# Patient Record
Sex: Female | Born: 1960 | Race: White | Hispanic: No | Marital: Single | Smoking: Current some day smoker
Health system: Southern US, Community
[De-identification: ages and names within clinical notes are randomized; demographics above are authoritative.]

## PROBLEM LIST (undated history)

## (undated) ENCOUNTER — Inpatient Hospital Stay: Admission: EM | Payer: Self-pay | Source: Home / Self Care

## (undated) DIAGNOSIS — G35 Multiple sclerosis: Secondary | ICD-10-CM

## (undated) DIAGNOSIS — F32A Depression, unspecified: Secondary | ICD-10-CM

## (undated) DIAGNOSIS — J439 Emphysema, unspecified: Secondary | ICD-10-CM

## (undated) DIAGNOSIS — R51 Headache: Secondary | ICD-10-CM

## (undated) DIAGNOSIS — T402X5A Adverse effect of other opioids, initial encounter: Secondary | ICD-10-CM

## (undated) DIAGNOSIS — F329 Major depressive disorder, single episode, unspecified: Secondary | ICD-10-CM

## (undated) DIAGNOSIS — R519 Headache, unspecified: Secondary | ICD-10-CM

## (undated) DIAGNOSIS — D126 Benign neoplasm of colon, unspecified: Secondary | ICD-10-CM

## (undated) DIAGNOSIS — R251 Tremor, unspecified: Secondary | ICD-10-CM

## (undated) DIAGNOSIS — R569 Unspecified convulsions: Secondary | ICD-10-CM

## (undated) DIAGNOSIS — E0590 Thyrotoxicosis, unspecified without thyrotoxic crisis or storm: Secondary | ICD-10-CM

## (undated) DIAGNOSIS — D352 Benign neoplasm of pituitary gland: Secondary | ICD-10-CM

## (undated) DIAGNOSIS — K219 Gastro-esophageal reflux disease without esophagitis: Secondary | ICD-10-CM

## (undated) DIAGNOSIS — M1990 Unspecified osteoarthritis, unspecified site: Secondary | ICD-10-CM

## (undated) DIAGNOSIS — K529 Noninfective gastroenteritis and colitis, unspecified: Secondary | ICD-10-CM

## (undated) DIAGNOSIS — K5790 Diverticulosis of intestine, part unspecified, without perforation or abscess without bleeding: Secondary | ICD-10-CM

## (undated) DIAGNOSIS — F419 Anxiety disorder, unspecified: Secondary | ICD-10-CM

## (undated) DIAGNOSIS — G8929 Other chronic pain: Secondary | ICD-10-CM

## (undated) DIAGNOSIS — F5000 Anorexia nervosa, unspecified: Secondary | ICD-10-CM

## (undated) DIAGNOSIS — G479 Sleep disorder, unspecified: Secondary | ICD-10-CM

## (undated) DIAGNOSIS — Z87448 Personal history of other diseases of urinary system: Secondary | ICD-10-CM

## (undated) DIAGNOSIS — Z9889 Other specified postprocedural states: Secondary | ICD-10-CM

## (undated) DIAGNOSIS — M797 Fibromyalgia: Secondary | ICD-10-CM

## (undated) DIAGNOSIS — R112 Nausea with vomiting, unspecified: Secondary | ICD-10-CM

## (undated) DIAGNOSIS — K5903 Drug induced constipation: Secondary | ICD-10-CM

## (undated) DIAGNOSIS — H269 Unspecified cataract: Secondary | ICD-10-CM

## (undated) DIAGNOSIS — M810 Age-related osteoporosis without current pathological fracture: Secondary | ICD-10-CM

## (undated) HISTORY — DX: Depression, unspecified: F32.A

## (undated) HISTORY — DX: Unspecified cataract: H26.9

## (undated) HISTORY — DX: Unspecified convulsions: R56.9

## (undated) HISTORY — DX: Noninfective gastroenteritis and colitis, unspecified: K52.9

## (undated) HISTORY — DX: Unspecified osteoarthritis, unspecified site: M19.90

## (undated) HISTORY — PX: OTHER SURGICAL HISTORY: SHX169

## (undated) HISTORY — DX: Major depressive disorder, single episode, unspecified: F32.9

## (undated) HISTORY — DX: Age-related osteoporosis without current pathological fracture: M81.0

## (undated) HISTORY — DX: Tremor, unspecified: R25.1

## (undated) HISTORY — PX: CHOLECYSTECTOMY: SHX55

## (undated) HISTORY — PX: OOPHORECTOMY: SHX86

## (undated) HISTORY — DX: Benign neoplasm of pituitary gland: D35.2

## (undated) HISTORY — DX: Anxiety disorder, unspecified: F41.9

## (undated) HISTORY — PX: TONSILLECTOMY: SUR1361

## (undated) HISTORY — DX: Headache: R51

## (undated) HISTORY — DX: Other chronic pain: G89.29

## (undated) HISTORY — PX: DENTAL SURGERY: SHX609

## (undated) HISTORY — PX: APPENDECTOMY: SHX54

## (undated) HISTORY — DX: Fibromyalgia: M79.7

## (undated) HISTORY — PX: NECK SURGERY: SHX720

## (undated) HISTORY — DX: Benign neoplasm of colon, unspecified: D12.6

## (undated) HISTORY — DX: Sleep disorder, unspecified: G47.9

## (undated) HISTORY — DX: Adverse effect of other opioids, initial encounter: K59.03

## (undated) HISTORY — PX: COLONOSCOPY: SHX174

## (undated) HISTORY — DX: Adverse effect of other opioids, initial encounter: T40.2X5A

## (undated) HISTORY — DX: Headache, unspecified: R51.9

## (undated) HISTORY — PX: LAPAROSCOPY: SHX197

## (undated) HISTORY — DX: Anorexia nervosa, unspecified: F50.00

## (undated) HISTORY — DX: Thyrotoxicosis, unspecified without thyrotoxic crisis or storm: E05.90

## (undated) HISTORY — DX: Diverticulosis of intestine, part unspecified, without perforation or abscess without bleeding: K57.90

## (undated) HISTORY — DX: Emphysema, unspecified: J43.9

---

## 1998-10-16 ENCOUNTER — Other Ambulatory Visit: Admission: RE | Admit: 1998-10-16 | Discharge: 1998-10-16 | Payer: Self-pay | Admitting: Obstetrics and Gynecology

## 2000-03-31 ENCOUNTER — Other Ambulatory Visit: Admission: RE | Admit: 2000-03-31 | Discharge: 2000-03-31 | Payer: Self-pay | Admitting: Obstetrics and Gynecology

## 2002-11-10 ENCOUNTER — Other Ambulatory Visit: Admission: RE | Admit: 2002-11-10 | Discharge: 2002-11-10 | Payer: Self-pay | Admitting: Ophthalmology

## 2004-10-15 ENCOUNTER — Encounter
Admission: RE | Admit: 2004-10-15 | Discharge: 2005-01-13 | Payer: Self-pay | Admitting: Physical Medicine and Rehabilitation

## 2004-10-17 ENCOUNTER — Ambulatory Visit: Payer: Self-pay | Admitting: Physical Medicine and Rehabilitation

## 2004-10-29 ENCOUNTER — Ambulatory Visit (HOSPITAL_COMMUNITY)
Admission: RE | Admit: 2004-10-29 | Discharge: 2004-10-29 | Payer: Self-pay | Admitting: Physical Medicine and Rehabilitation

## 2004-12-20 ENCOUNTER — Ambulatory Visit: Payer: Self-pay | Admitting: Physical Medicine and Rehabilitation

## 2005-01-21 ENCOUNTER — Encounter
Admission: RE | Admit: 2005-01-21 | Discharge: 2005-04-21 | Payer: Self-pay | Admitting: Physical Medicine and Rehabilitation

## 2005-01-21 ENCOUNTER — Ambulatory Visit: Payer: Self-pay | Admitting: Physical Medicine and Rehabilitation

## 2005-03-05 ENCOUNTER — Ambulatory Visit: Payer: Self-pay | Admitting: Physical Medicine and Rehabilitation

## 2005-05-01 ENCOUNTER — Ambulatory Visit: Payer: Self-pay | Admitting: Physical Medicine and Rehabilitation

## 2005-05-01 ENCOUNTER — Other Ambulatory Visit: Admission: RE | Admit: 2005-05-01 | Discharge: 2005-05-01 | Payer: Self-pay | Admitting: Obstetrics and Gynecology

## 2005-05-01 ENCOUNTER — Encounter
Admission: RE | Admit: 2005-05-01 | Discharge: 2005-07-30 | Payer: Self-pay | Admitting: Physical Medicine and Rehabilitation

## 2005-06-28 ENCOUNTER — Ambulatory Visit: Payer: Self-pay | Admitting: Physical Medicine and Rehabilitation

## 2005-07-30 ENCOUNTER — Encounter
Admission: RE | Admit: 2005-07-30 | Discharge: 2005-10-28 | Payer: Self-pay | Admitting: Physical Medicine and Rehabilitation

## 2005-07-30 ENCOUNTER — Ambulatory Visit: Payer: Self-pay | Admitting: Physical Medicine and Rehabilitation

## 2005-08-29 ENCOUNTER — Ambulatory Visit: Payer: Self-pay | Admitting: Physical Medicine and Rehabilitation

## 2005-10-25 ENCOUNTER — Encounter
Admission: RE | Admit: 2005-10-25 | Discharge: 2006-01-23 | Payer: Self-pay | Admitting: Physical Medicine and Rehabilitation

## 2005-10-25 ENCOUNTER — Ambulatory Visit: Payer: Self-pay | Admitting: Physical Medicine and Rehabilitation

## 2005-12-03 ENCOUNTER — Ambulatory Visit: Payer: Self-pay | Admitting: Physical Medicine and Rehabilitation

## 2005-12-30 ENCOUNTER — Ambulatory Visit: Payer: Self-pay | Admitting: Physical Medicine and Rehabilitation

## 2006-01-24 ENCOUNTER — Encounter
Admission: RE | Admit: 2006-01-24 | Discharge: 2006-04-24 | Payer: Self-pay | Admitting: Physical Medicine and Rehabilitation

## 2006-02-21 ENCOUNTER — Ambulatory Visit: Payer: Self-pay | Admitting: Physical Medicine and Rehabilitation

## 2006-03-27 ENCOUNTER — Ambulatory Visit: Payer: Self-pay | Admitting: Physical Medicine and Rehabilitation

## 2006-04-25 ENCOUNTER — Encounter
Admission: RE | Admit: 2006-04-25 | Discharge: 2006-07-24 | Payer: Self-pay | Admitting: Physical Medicine and Rehabilitation

## 2006-04-30 ENCOUNTER — Ambulatory Visit: Payer: Self-pay | Admitting: Physical Medicine and Rehabilitation

## 2006-07-04 ENCOUNTER — Ambulatory Visit: Payer: Self-pay | Admitting: Physical Medicine and Rehabilitation

## 2006-07-08 HISTORY — PX: ABDOMINAL HYSTERECTOMY: SHX81

## 2006-07-30 ENCOUNTER — Encounter
Admission: RE | Admit: 2006-07-30 | Discharge: 2006-10-28 | Payer: Self-pay | Admitting: Physical Medicine and Rehabilitation

## 2006-08-26 ENCOUNTER — Ambulatory Visit: Payer: Self-pay | Admitting: Physical Medicine and Rehabilitation

## 2006-10-30 ENCOUNTER — Encounter
Admission: RE | Admit: 2006-10-30 | Discharge: 2007-01-28 | Payer: Self-pay | Admitting: Physical Medicine and Rehabilitation

## 2006-11-27 ENCOUNTER — Ambulatory Visit: Payer: Self-pay | Admitting: Physical Medicine and Rehabilitation

## 2006-12-26 ENCOUNTER — Encounter
Admission: RE | Admit: 2006-12-26 | Discharge: 2007-03-26 | Payer: Self-pay | Admitting: Physical Medicine and Rehabilitation

## 2007-01-12 ENCOUNTER — Ambulatory Visit: Payer: Self-pay | Admitting: Physical Medicine and Rehabilitation

## 2007-02-10 ENCOUNTER — Encounter
Admission: RE | Admit: 2007-02-10 | Discharge: 2007-05-11 | Payer: Self-pay | Admitting: Physical Medicine and Rehabilitation

## 2007-03-13 ENCOUNTER — Ambulatory Visit: Payer: Self-pay | Admitting: Physical Medicine and Rehabilitation

## 2007-04-14 ENCOUNTER — Ambulatory Visit: Payer: Self-pay | Admitting: Physical Medicine and Rehabilitation

## 2007-04-15 ENCOUNTER — Ambulatory Visit (HOSPITAL_COMMUNITY)
Admission: RE | Admit: 2007-04-15 | Discharge: 2007-04-15 | Payer: Self-pay | Admitting: Physical Medicine and Rehabilitation

## 2007-05-12 ENCOUNTER — Encounter
Admission: RE | Admit: 2007-05-12 | Discharge: 2007-06-19 | Payer: Self-pay | Admitting: Physical Medicine and Rehabilitation

## 2007-05-12 ENCOUNTER — Ambulatory Visit: Payer: Self-pay | Admitting: Physical Medicine and Rehabilitation

## 2007-07-14 ENCOUNTER — Ambulatory Visit: Payer: Self-pay | Admitting: Physical Medicine and Rehabilitation

## 2007-07-14 ENCOUNTER — Encounter
Admission: RE | Admit: 2007-07-14 | Discharge: 2007-10-12 | Payer: Self-pay | Admitting: Physical Medicine and Rehabilitation

## 2007-11-11 ENCOUNTER — Encounter
Admission: RE | Admit: 2007-11-11 | Discharge: 2007-11-11 | Payer: Self-pay | Admitting: Physical Medicine and Rehabilitation

## 2008-05-19 ENCOUNTER — Encounter
Admission: RE | Admit: 2008-05-19 | Discharge: 2008-08-17 | Payer: Self-pay | Admitting: Physical Medicine and Rehabilitation

## 2008-05-20 ENCOUNTER — Ambulatory Visit: Payer: Self-pay | Admitting: Physical Medicine and Rehabilitation

## 2008-06-17 ENCOUNTER — Ambulatory Visit: Payer: Self-pay | Admitting: Physical Medicine and Rehabilitation

## 2008-07-15 ENCOUNTER — Ambulatory Visit: Payer: Self-pay | Admitting: Physical Medicine and Rehabilitation

## 2008-09-05 ENCOUNTER — Ambulatory Visit: Payer: Self-pay | Admitting: Physical Medicine and Rehabilitation

## 2008-09-05 ENCOUNTER — Encounter
Admission: RE | Admit: 2008-09-05 | Discharge: 2008-12-04 | Payer: Self-pay | Admitting: Physical Medicine and Rehabilitation

## 2008-11-02 ENCOUNTER — Ambulatory Visit: Payer: Self-pay | Admitting: Physical Medicine and Rehabilitation

## 2008-12-26 ENCOUNTER — Encounter
Admission: RE | Admit: 2008-12-26 | Discharge: 2009-03-26 | Payer: Self-pay | Admitting: Physical Medicine and Rehabilitation

## 2008-12-28 ENCOUNTER — Ambulatory Visit: Payer: Self-pay | Admitting: Physical Medicine and Rehabilitation

## 2009-02-24 ENCOUNTER — Ambulatory Visit: Payer: Self-pay | Admitting: Physical Medicine and Rehabilitation

## 2009-03-06 ENCOUNTER — Ambulatory Visit (HOSPITAL_COMMUNITY)
Admission: RE | Admit: 2009-03-06 | Discharge: 2009-03-06 | Payer: Self-pay | Admitting: Physical Medicine and Rehabilitation

## 2009-05-25 ENCOUNTER — Encounter
Admission: RE | Admit: 2009-05-25 | Discharge: 2009-06-29 | Payer: Self-pay | Admitting: Physical Medicine and Rehabilitation

## 2009-05-26 ENCOUNTER — Ambulatory Visit: Payer: Self-pay | Admitting: Physical Medicine and Rehabilitation

## 2009-07-17 ENCOUNTER — Encounter
Admission: RE | Admit: 2009-07-17 | Discharge: 2009-10-06 | Payer: Self-pay | Admitting: Physical Medicine and Rehabilitation

## 2009-07-19 ENCOUNTER — Ambulatory Visit: Payer: Self-pay | Admitting: Physical Medicine and Rehabilitation

## 2009-10-06 ENCOUNTER — Ambulatory Visit: Payer: Self-pay | Admitting: Physical Medicine and Rehabilitation

## 2009-11-30 ENCOUNTER — Encounter: Admission: RE | Admit: 2009-11-30 | Discharge: 2009-11-30 | Payer: Self-pay | Admitting: Psychiatry

## 2010-07-29 ENCOUNTER — Encounter: Payer: Self-pay | Admitting: Psychiatry

## 2010-11-20 NOTE — Assessment & Plan Note (Signed)
[name] is a married 50 year old woman who has two children at home.  She was last seen by me on June 17, 2008.   She is a patient of Dr. Keturah Barre and Dr. Delene Loll and Dr.  Trudie Buckler.   She is being followed in our Pain and Rehabilitative clinic for chronic  pain which is predominately related to her multiple sclerosis.  She is  currently on hydrocodone.  She is back in today for an another  prescription.   She states her average pain is about 7 on a scale of 10.  She reports  good relief with the use of the hydrocodone.  She is requesting a slight  escalation in her dosage from 3 times day to an occasional fourth dose  per day.   Her sleep is fair to afford.  Pain is typically worse with activities,  improves with rest, therapy, pacing her activities, and medication.   FUNCTIONAL STATUS:  The patient is able to walk a variable amount of  time depending on the day with her multiple sclerosis.  She is able to  climb stairs, but needs assistance.  She does drive.  She is independent  with self care and needs assistance with heavier household tasks.  She  has occasional difficulty with her bladder, currently no problems at  this time.  She does report history of depression and anxiety, but  denies harm to self or others.   REVIEW OF SYSTEMS:  Otherwise negative.  She has multiple complaints  related to the multiple sclerosis including patchy numbness in the upper  and lower extremities as well as periorally, visual problems  intermittently, and weakness and tremors and spasms intermittently.   No other changes in past medical, social, and family history since I saw  her our last she has been relatively healthy.   Medications which are provided through this clinic include hydrocodone  and acetaminophen 7.5/325 three times a day, 90 per month.   Other medications which she takes which are provided through other  physicians include the following from Dr.  Tinnie Gens, she uses Copaxone,  baclofen 10 mg she takes is typically one at night, and methylphenidate  per Dr. Tinnie Gens, 10 mg once a day about 5 times per week, gabapentin 300  mg once at night typically per Dr. Tinnie Gens as well.  Through Dr.  Claudie Revering, she uses Xanax 0.5 up to 4 times a day and Prozac 40 mg 2  tablets once a day.  She has recently been put within the last week  Remeron 15 mg at night.  She is also on omeprazole per Dr. Tinnie Gens and  Zofran.   Exam today, blood pressure is 106/73, pulse 76, respirations 18, and 97%  saturated on room air.  She is a thin, adult female who appears her  stated age, does not appear in any distress.   She is oriented x3.  Speech is slightly dysarthric and her overall  affect is bright, alert.  She is cooperative and pleasant.  Answers  questions appropriately and follows commands without difficulty.   She has diminished coordination in bilateral upper and lower  extremities.  Reflexes are slightly brisk throughout.  She has sensation  to light touch in her upper as well as lower extremities.  She also  states periorally she has some numbness today and along her left cheek.   Her motor strength is in the 4-/5 range at hip flexors, hip abductors  and knee extensors, dorsiflexors are  4/5.  She has very little in the  way of evertors bilaterally.  Plantar flexors are 4+/5.  Again,  dysarthria was noted when she speaks.   She is able to transition from sitting to standing.  She has some tremor  today when she stands up.  Her gait is stable, however, she does have  difficulty with tandem gait and Romberg test.   IMPRESSION:  1. 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  2. Mild cervical spondylosis.  3. 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   PLAN:  We will  refill her hydrocodone 7.5/325 three to four times a day,  105 tablets for the month with one refill.   She has been taking her medications as prescribed and does not exhibit  any aberrant behavior with their use.  Pill counts are appropriate.  We  will see her back in 2 months.           ______________________________  Brantley Stage, M.D.     DMK/MedQ  D:  09/05/2008 12:14:50  T:  09/06/2008 02:39:11  Job #:  [id number]   cc:   Keturah Barre, MD   Delene Loll, MD   Trudie Buckler, MD

## 2010-11-20 NOTE — Assessment & Plan Note (Signed)
[name] is 50 year old married female who is being seen in our Pain  and Rehabilitative Clinic for pain complaints and impairments related to  Multiple Sclerosis. She has cervicalgia, lumbago, bilateral upper  extremity pain.   She reports over the last month she has been able to decrease her  narcotic intake. She had been taking four 10/325 Norco tablets per day.  She is down to 3. Over the last month she states that she does feel not  quite as drowsy when she takes 1 pill less during the day.   Her average pain is about a 7 on a scale of 10 which is basically  unchanged from last month. She is getting good relief with the current  meds which is an improvement also from last month.   Pain is described as an intermittent depending on activity levels,  describes it as sharp burning stabbing, tingling, aching, variably. The  amount she can walk is variable depending on the day. She is able to  climb stairs. She is able to drive. She has noted when she is out in the  community when she gets fatigued, her left foot does tend to drop and  she trips on it. She has not had any falls in the last month however.   REVIEW OF SYSTEMS:  Reviewed and attached to the chart, basically  noncontributory at today's visit.   Past medical, social, family history otherwise unchanged. She states she  is considering enrolling in a Multiple Sclerosis medication trial per  Dr. Tinnie Gens.   Medications provided by this clinic include: Norco 10/325 one p.o.  q.i.d. p.r.n. pain.   PHYSICAL EXAMINATION:  Today blood pressure is 106/69, pulse 95,  respirations 18, and 95% saturated on room air. She is a well-developed  thin adult female who does not appear in any distress.   She is oriented x3. Speech is clear. Affect is bright, alert,  cooperative, and pleasant. Follows commands without difficultly.   Transiting from sit-to-stand is done without difficultly. She did have a  little balance issue first upon  standing however gait in the room is  steady and she is able to maintain good balance. Tandem gait, Romberg's  tests are compromised however.   Reflexes are slightly increased in the lower extremities. No clonus is  noted.  Motor strength is 5/5 at hip flexors and extensors, dorsiflexors  on the right.  Dorsiflexors on  the left are compromised, she has very  little in the way of evertors on the left.   IMPRESSION:  1. Multiple Sclerosis.  2. Balance disorder secondary to #1.  3. Neuropathic lower extremity pain.  4. Bilateral lower extremity weakness especially around ankles. I am      recommending a left AFO for her for longer distances. She is not      interesting in persuing that at this time.  5. Multiple pain complaints including bilateral upper extremity pain,      intrascapular pain, lower extremity pain.  6. History of depression, anxiety.   PLAN:  [name] is overall doing well. She has been able to decrease  her narcotic usage from 4 times a day down to 3 times a day. Over the  last month overall function and pain scores have actually improved with  decreasing medication slightly. She does feel clearer.   I have recommended she consider left AFO for longer distances. She is  also going to consider discussing further evaluation with Dr. Tinnie Gens.  She will be  following up with him over the next month or 2. We will see  her back in a month. The following medications were written for her  today:  Norco 10/325 1 p.o. t.i.d. #90 no refills.           ______________________________  Brantley Stage, M.D.     DMK/MedQ  D:  03/16/2007 12:01:30  T:  03/16/2007 15:01:08  Job #:  [id number]

## 2010-11-20 NOTE — Assessment & Plan Note (Signed)
HISTORY OF PRESENT ILLNESS:  [name] is a 50 year old married female  who has a history of multiple sclerosis and who has been off and on  steroids for the last 10 years.   She is back in our clinic today for a refill of her hydrocodone. She  states over the last 10 days, she has had 2 falls. The last one was last  night.   She believes that her left side has been getting a bit weaker. She also  has noticed some increased dysarthria. She apparently tripped over her  toe in the case of both falls.   She is complaining today of some neck pain and left shoulder pain, as  well as some low back pain.   Her average pain is about an 8 on a scale of 10. The pain is worse in  the morning. Sleep is fair. She has been getting good relief with the  current medications that she has been prescribed.   The pain is described as sharp, burning, intermittent, sometimes more  constant, tingling, and aching in nature.   She has been having increasing difficulty with walking over the last  several weeks, tripping more on her left foot. At the last visit, I have  recommended she obtain and AFO for her left ankle. She has been  reluctant to pursue this, however.   She admits to some depression and anxiety. Denies suicidal ideations.   No other changes in past medical, social, or family history since last  visit.   MEDICATIONS:  Prescribed by this clinic include Norco 10/325 up to three  times a day on a p.r.n. basis. She had brought in 8 extra pills from  last month. Did not use all of her medication.   PHYSICAL EXAMINATION:  VITAL SIGNS:  Blood pressure is 104/55, pulse 76,  respiratory rate 18, 100% saturated on room air.  GENERAL:  A thin, adult female who does not appear in any distress.  NEUROLOGIC:  She is oriented x3. Speech is slightly dysarthric than I've  seen her on other visits. Her affect is overall bright. She is alert and  she is cooperative and follows commands without  difficulty. She  transitions from sitting to standing easily. As she was standing, she  did start to fall towards the left and I did need to catch her while she  was in clinic. She states that she typically just hangs on to walls when  she is walking at home and she runs into trouble when there is nothing  to hand onto. She has limited motion, especially with rotation to the  right, with respect to her cervical rotation. She has about 20 degrees  of rotation to the left and about 60 degrees of rotation to the right.  Flexion extension are mildly limited and she reports some discomfort in  her posterior mid cervical spine with these motions. She has limitations  in left shoulder abduction today and increased pain in the left  shoulder. She has tenderness along the distal acromial and in the  proximal humerus with palpation, although she has some milder diffuse  tenderness throughout the entire shoulder. She also had some tenderness  over the L5-S1 lumbar spine. Reflexes tend to be brisk throughout,  decreased at both ankles. She has a bit of increased tone in both upper  extremities today. Her motor strength is 4+/5 at the shoulder abductors,  biceps, triceps, brachial radialis and finger flexors. She has 5/5 at  the  hip flexors, knee extensors, and plantar flexors. She has very  little dorsiflexor on the left or everter on the left. Right is slightly  weak as well on examination today. She has patchy numbness in upper as  well as lower extremities, however, this is not new for her.   IMPRESSION:  This is a 50 year old with a 10 year history of being on  steroids off and on over the last 10 years with multiple sclerosis, who  appears to have a history of increasing weakness over the last several  weeks with a history of 2 falls within the last 7 to 10 days. She is  complaining of some neck discomfort and has decreased range of motion. I  would like to obtain cervical spine series to rule  out any fracture, as  well as in her left shoulder will get an AP and lateral of her left  shoulder, as well as her lumbar spine.   Would also strongly consider imaging of brain and cervical spine. She  may well have a new lesion with respect to her MS. I did place a call  over to her neurologist, Dr. Reva Bores office. I spoke with nursing  staff over there and they will see her tomorrow morning at 9:00 a.m. Dr.  Tinnie Gens is in the office and she has an appointment with nurse  practitioner, Leane Para.   She is being sent currently to [facility]  Department for x-rays. I have called over there and given them my cell  phone number and to let me know regarding any evidence of trauma. In  light of that, would order cervical MRI.   Otherwise, I will see her back in a month. I have written an order for  her to obtain an AFO on the left and I have arranged for followup with  her neurologist tomorrow morning at 9:00 a.m.           ______________________________  Brantley Stage, M.D.     DMK/MedQ  D:  04/15/2007 13:05:43  T:  04/15/2007 21:35:24  Job #:  [id number]

## 2010-11-20 NOTE — Assessment & Plan Note (Signed)
REASON FOR VISIT:  [name] is a pleasant, 50 year old, married  female who is a patient of Dr. Tinnie Gens, who is her neurologist.  She was  last seen by me on Nov 26, 2006.  She is back in today requesting a  refill of her Norco.   She states her average pain is about a 6-7/10 localized to the cervical  region, lumbar area, bilateral lower extremities and bilateral upper  extremities.  She gets fair relief with the current medications that she  is taking.  She is also followed by psychiatrist as well as Dr. Tinnie Gens.  Apparently, she has had some recent medicine changes and had been put on  Seroquel one dose in the morning and at night.  She states she had been  on Trazodone at one point which seems to have helped her sleep.  She  would like to maybe go back to that at some point and will discuss this  further with her psychiatrist next week.   Her ability to ambulate is variable and depends on the day her fatigue  waxes and wanes on a daily basis.  She has seen physical therapist for  equipment evaluation.  She did not do well with crutches and  recommendations toward a platform walker were made.  She does have  access to this at her home.  She is still interested in pursuing scooter  for community distances.  Her fatigue and pain greatly limit her ability  to participate in community activities at this point and she feels that  some of her depression may be coming from being shut in at home so much.   There are no new changes regarding her review of systems.  She denies  suicidal ideation.  She does admit to depression and anxiety, however,  and intermittent bowel and bladder irregularities.   Past medical history, social history, family history are otherwise  unchanged.  She has been healthy for the most part.   MEDICATIONS:  Norco 10/325 one p.o. four times a day p.r.n. pain.   PHYSICAL EXAMINATION:  VITAL SIGNS:  Blood pressure 106/69, pulse 95,  respirations 18, 95%  saturations on room air.  GENERAL:  She is a thin, adult female who does not appear in any  distress.  She is oriented x3.  Her speech is clear.  Her affect is  bright and alert.  She is cooperative and pleasant.  Follows commands  without difficulty.  She transitions from sitting to standing with ease  today.  Her gait in the room is asymmetric.  She has some weakness in  the dorsiflexors on the left during gait cycles.  She does not trip on  her toe, however.  NEUROMUSCULAR:  Strength in the right lower extremity in the 5/5 range,  left lower extremity is 4+ at the hip flexors, 5/5 with knee extensors,  4-/5 on the left dorsiflexors.  Balance is poor.  She has some increased  tone when she extends her lower extremities.   IMPRESSION:  1. Multiple sclerosis.  2. Balance disorder secondary to #1.  3. Neuropathic lower extremity and upper extremity pain.  4. Bilateral lower extremity weakness especially ankle dorsiflexors      and everters on the left.  Multiple pain complaints.  5. History of depression and anxiety.   PLAN:  Will refill her Norco today.  She is considering a pool program  based in her community.  She seems to be upbeat about this.  She is  frustrated.  She continues to verbalize frustration regarding her  ability to negotiate community distances.  Would strongly consider a  motorized wheelchair or scooter for her.  She would like to further  discuss this with Dr. Tinnie Gens as well.  Will see her back in 2 months  with nursing visit next month for refill of her medications.           ______________________________  Brantley Stage, M.D.     DMK/MedQ  D:  01/14/2007 13:34:52  T:  01/15/2007 07:50:44  Job #:  [id number]

## 2010-11-20 NOTE — Assessment & Plan Note (Signed)
[name] is a pleasant 50 year old married woman who is currently  living with her parents due to declining functional status.  She is a  patient of doctors, Keturah Barre, Delene Loll, and Harriette Bouillon.   She was last seen in our clinic on November 02, 2008.  She is followed in  our Pain and Rehabilitative Clinic for multiple chronic pain complaints  including left shoulder pain, low back pain, and bilateral lower  extremity dysesthetic pain.  Her pain is felt to be predominately  neuropathic in nature related to her multiple sclerosis.   Dr. Tinnie Gens continues to manage her multiple sclerosis symptoms as well.   She is back in today requesting refill of her medication.  She states in  the interim she has had some bout of low back pain which is treated with  steroids by Dr. Keturah Barre.  She has also had some increasing  discomfort in her ankles which is worse when she is up walking.  She had  some blood tests run by Dr. Sherral Hammers for rheumatoid arthritis and SLE.   With respect to her pain, she states it is about 7 on a scale of 10,  located in low back, bilateral lower extremities, worse with activities,  however, does come on while she is inactive as well.  Pain is better  with rest and pacing her activities and medication.  She reports fair-to-  good relief with current meds.   Medications that which are prescribed through this clinic include Norco  7.5/325 three to four times a day, not more than 105 tablets per month.   She also takes Xanax 1-3 tablets per day 0.5 mg strength.  She uses  Neurontin typically not more than 2 tablets per day of the 300 mg  strength.  She takes baclofen as well 10 mg, in the last month she has  taken possibly 5 pills, she does not take this very frequently.  She  uses Remeron for sleep at night, taking 2 tablets at night.  The only  medication prescribed through this clinic is hydrocodone which she takes  typically 2-3 times per  day.   FUNCTIONAL STATUS:  The patient is able to ambulate independently.  She  does drive.  She is independent with self-care, needs assistance with  higher household tasks and meal prep.   She has intermittent problems with bladder, bowel, numbness, tingling,  tremors, trouble walking, spasms, depression, anxiety.  Denies suicidal  ideation, however.   Past medical, social and family history otherwise noncontributory other  than noted in history of present illness.   PHYSICAL EXAMINATION:  Today, blood pressure is 102/66, pulse 96,  respiration 18, 99% saturated on room air.  She is a well-developed,  well-nourished female who does not appear in any distress.  She is  oriented x3.  Her speech is slightly dysarthric.  She is oriented and  follows commands without difficulty, answers my questions appropriately.   Coordination is diminished.  Reflexes are brisk in the lower  extremities.  No clonus is appreciated today.  Motor strength in the  lower extremities in the 4+-5/5 range.  Evertors are typically weaker  bilaterally.   Transitioning from sitting to standing is done without difficulty.  Her  gait is slow and careful.  She is able to perform tandem gait today with  a little difficulty.  Romberg test is not tested due to safety reasons.   Lower extremities are examined today.  She has discomfort with range  of  motion in her ankles, some tenderness along the ankle joint today.  She  has some tenderness in the distal tibia up to the right mid shaft of the  right tibia mostly just distal tenderness in the left lower extremity.   IMPRESSION:  1. New bilateral ankle pain, etiology not clear.  Consider radiographs      today.  [name] would like to defer this and discuss this      further with her primary care physician, Dr. Keturah Barre.  2. Multiple sclerosis with balance disorder, spasticity      intermittently, mild dysarthria, intermittent bladder dysfunction,       occasional visual disturbances, variable changes in the lower      extremity strength.  3. Mild cervical spondylosis with cervicalgia.  4. Known foraminal stenosis at C6-7 level on the left.  5. Lumbago with intermittent flare-ups may well be facet mediated.   PLAN:  We will refill her medications today, hydrocodone 7.5/325 up to 3-  4 times per day #105 tablets per month with refills.   [name] takes her pain medications as prescribed, does not exhibit  any aberrant behavior with the use of her Vicodin, reports overall good  relief with it.  We would like to pursue radiographs of lower  extremities.  She would like to defer this and discuss her new lower  extremity pain with Dr. Keturah Barre.  We will continue to monitor her  narcotic usage.  She does not exhibit any aberrant behavior, and pill  counts are appropriate.  We will see her back in 2 months.           ______________________________  Brantley Stage, M.D.     DMK/MedQ  D:  12/28/2008 11:04:02  T:  12/29/2008 00:41:34  Job #:  [id number]   cc:   Keturah Barre, MD

## 2010-11-20 NOTE — Assessment & Plan Note (Signed)
[name] is a 50 year old woman who has a history of multiple  sclerosis.  She is a patient of Dr. Keturah Barre, Delene Loll,  and Dr. Trudie Buckler.  She was last seen in our clinic on September 05, 2008.  She has multiple pain complaints, which include cervicalgia, left  upper extremity pain intermittently, low back pain, and bilateral lower  extremity pain, which is felt to be neuropathic in nature, related to  her multiple sclerosis.   She has some complaints of achiness in her back, when she stands for  long period of time.  Otherwise, overall she has been doing well.  She  reports good relief of her pain from the current medications being used.   MEDICATIONS:  Medication from this clinic include Vicodin 7.5/500 up to  3 and occasionally 4 times per day. She also is taking Neurontin 300 mg  up to 3 times a day as well per Dr. Tinnie Gens.  She is also on baclofen 10  mg 1-2 tablets 3 times a day per Dr. Tinnie Gens and alprazolam 0.5 up to 3-  4 times per day.  She has not been on any steroids for almost 6 months  now per her history.  Overall she has been doing relatively well.  She  is helped now by her mother.  She needs assistance with dressing and  bathing.  She is independent with feeding and toileting.  She can walk,  varies from day to day, some days she can climb stairs, other days, not.  Occasionally she can drive.  She does have some bladder control problems  intermittently.  Continues to have numbness and tingling in bilateral  lower extremities and predominantly left upper extremity weakness,  overall has improved over the last month.   No other changes in past medical, social, or family history.   PHYSICAL EXAMINATION:  VITAL SIGNS:  Today blood pressure is 116/64,  pulse 81, respiration 18, 100% saturated on room air.  She is a thin  adult female who does not appear in any distress.  GENERAL:  She is oriented x3.  Her speech is very slightly dysarthric.  She is  alert, cooperative and pleasant.  Follows commands without  difficulty and answers all questions appropriately.   Coordination is slightly impaired bilateral upper extremities as well as  some difficulty with tandem gait.  Romberg test, she tends to fall  backward with.   Reflexes are brisk at the patellar tendons and Achilles tendon is  slightly diminished, but no clonus is appreciated today.  No increased  tone is appreciated.   Motor strength in the lower extremities is 4/5 at the hip flexors, 5/5  at knee extensors bilaterally, 4/5 with bilateral dorsiflexors.  Evertors are 3+/5 bilaterally.  She is able to do toe raises today.   IMPRESSION:  1. Multiple sclerosis with balance disorder, spasticity in upper and      lower extremities, variably today is somewhat improved, and mild      dysarthria, intermittent bladder dysfunction and occasional visual      disturbances and variable weakness in the lower extremities.  2. Mild cervical spondylosis.  3. 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  4. Lumbago, intermittently worse with prolonged standing, no pain with      flexion, may well be facet mediated or possibly muscular.   PLAN:  She has used ibuprofen on occasion for her low back pain and she  is not taking it on a daily  basis at this time.  She continues to use  Neurontin daily as well as baclofen per Dr. Tinnie Gens and through our  clinic, she is using Vicodin 7.5 up to 3 times per day occasionally for  not more than 105 tablets per month, prescribed.   She has been taking her pain medications as prescribed.  She has not  exhibited any aberrant behavior with the use of the Vicodin and she is  reporting overall good relief with it.  We have discussed physical  therapy to help with the  back pain.  However, therapy in the past has made herself fatigue.  She  was unable to really participate in her ADLs and required more help  then.  We will continue to monitor her  narcotic usage and we will see  her back in 2 months.           ______________________________  Brantley Stage, M.D.     DMK/MedQ  D:  11/02/2008 12:05:04  T:  11/03/2008 00:55:39  Job #:  [id number]   cc:   Trudie Buckler  Fax: [phone]

## 2010-11-20 NOTE — Assessment & Plan Note (Signed)
[name] is a 50 year old married woman who has followed in our  [facility] for chronic pain complaints related to  cervicalgia.  She is known to have biforaminal stenosis at C6-C7 as well  as complications of multiple sclerosis.  She was last seen by me back in  April 2009.   She had           ______________________________  Brantley Stage, M.D.     DMK/MedQ  D:  05/20/2008 13:55:06  T:  05/21/2008 02:55:45  Job #:  [id number]

## 2010-11-20 NOTE — Assessment & Plan Note (Signed)
[name] is back in today for a refill of her Hydrocodone.  She is a  50 year old married woman with teenage children.  She has a history of  multiple sclerosis, was recently hospitalized and had steroids in the  last month for exacerbation.   She has multiple pain complaints including cervicalgia and leg achiness  and heaviness in both lower extremities.  Pain is described as variable.  She has good days and bad days. Pain is described as sharp, burning,  dull, stabbing, tingling, aching in nature.  Average pain is about an 8  on a scale of 10.  Today it is about a 7.  Her pain and her weakness  from multiple sclerosis significantly interfere with her general  activity and enjoyment of life.   Pain is fairly constant throughout the day.  Worse with activities.  Improves with rest, pacing her activities, medications.  She reports  good relief with the medications prescribed through out clinic.   She currently is using Norco 7.5/325, three times a day over the last  month.   Her functional status varies.  She drove herself to our clinic this  morning.  She is independent with feeding and toileting most of the time  and needs occasionally some assistance with dressing, bathing, nail  prep, and household duties.   She admits to some depression and anxiety.  Denies suicidal ideation.  Has occasional constipation.   No other changes in past medical, social or family history since last  visit.   ALLERGIES:  PAXIL, MORPHINE, LYRICA.   PHYSICAL EXAMINATION:  Blood pressure:  100/58.  Pulse:  65.  Respiration:  18.  99% saturated on room air.  She is a thin adult  female who does not appear in any distress.  She is oriented x3.  Her  speech is clear.  Her affect is bright.  She is alert, cooperative and  pleasant.  She follows commands without difficulty.  She is able to  transition easily from sitting to standing.  Her gait is mildly ataxic.  Difficulty with tandem gait has been  noted in the past and was not  specifically tested today.  Romberg's test was not tested today.  She  has significant difficulty typically with Romberg's as well.  She has  limitations with cervical range of motion.  Some limitations are noted  with rotation.  She lacks about 25% of normal rotation to the right as  well as left.  Some limitations are noted in forward flexion and  extension..   Full shoulder range is appreciated.  Reflexes are 2+ throughout.  There  is no clonus noted.  No abnormal tone is noted today.  She has 4+  strength in the upper extremities.  Lower extremities at hips and knees  are in the 4+/5 range.  She does have weakness around both dorsiflexors  of her ankles, 3-4-/5 and some plantar flexion weakness is noted on the  right as well.   IMPRESSION:  1. Multiple sclerosis with balance disorder and lower extremity      weakness.  2. Cervicalgia.  MRI, which was ordered through Dr. Reva Bores office      done on June 11, 2007 with and without contrast, showed mild      posterior osteophytes at C5-6 and mild to moderate bilateral      foraminal stenosis at C6-7 as well as some osteophytes at that      level as well.  No central or significant lateral  stenosis was      appreciated.  This MRI was read by Raynelle Highland.  3. History of depression and anxiety.   PLAN:  We will give a soft cervical collar to use on a p.r.n. basis for  neck pain.  She is requesting to eventually discontinue her Hydrocodone.  Her goal is to switch over to Tramadol in the next month or two.  She  has been on 7.5/325 Norco three times a day last month.  This month we  will drop her dose to 5/325 four times a day for 15 days, then 5/325 for  the next 15 days and we will plan on pushing her to Ultracet, two  tablets t.i.d. next month.  She is also considering trialing Baclofen  again in the next couple of months.  We will hold off on that until she  switches over to Tramadol and is  tolerating that well.  We will see her  back in a month.  She takes her medications as prescribed.  No aberrant  behavior is appreciated with her and she does note overall improvement  in her pain with the use of the medications.  Pill counts have been  appropriate.           ______________________________  Brantley Stage, M.D.     DMK/MedQ  D:  07/15/2007 12:26:14  T:  07/15/2007 14:48:38  Job #:  [id number]   cc:   Tinnie Gens, Dr.

## 2010-11-20 NOTE — Assessment & Plan Note (Signed)
[name] is a 50 year old married woman who is followed in our [facility] for chronic pain.  She has a history, which is  significant for multiple sclerosis, she has balance disorder,  spasticity, dysarthria, bladder dysfunction, visual disturbances,  variations in strength to her lower extremity, and trouble walking at  times.   She is also followed by Sierra Morris, her neurologist.  She sees Dr.  Sherral Hammers as her primary care doctor and Delene Loll as well.   She was last seen by me on December 28, 2008.  She has a chief complaint  today of chronic left lower extremity pain, which radiates to the left  lower extremity and to the foot.  She also has other chronic pain  complaints including some band of discomfort in her thoracic region,  bilateral upper extremity pain worse on the left, bilateral lower  extremity pain.  However, her left leg is the worst problem for her at  this time.   Her average pain is about 7 on a scale of 10.  She is requesting refill  of her hydrocodone up to 3 times a day; however, she indicates that she  is having some problems financially, may need to discontinue her  narcotics completely due to cost.  She is currently on 7.5/325.  She is  requesting to be dropped to 5/500 tablets.   She reports overall good relief with her medicines.   She is getting some Neurontin through Dr. Tinnie Gens to take 300 mg 2  tablets up to 4 times a day.   She has some difficulty with ambulation due to balance and weakness in  her lower extremities, occasional problems with bowel and bladder are  noted as well, some difficulty at times with driving and stairs,  independent with self-care for the most part.  Denies suicidal ideation,  admits to some depression and anxiety.   REVIEW OF SYSTEMS:  Otherwise negative.   PAST MEDICAL, SOCIAL, FAMILY HISTORY:  Otherwise unchanged.   PHYSICAL EXAMINATION:  VITAL SIGNS:  Blood pressure is 102/69, pulse  88,  respirations 18, 97% saturated on room air.  GENERAL:  She is a well-developed, well-nourished female who does not  appear in any distress.  She is oriented x3.  Speech is slightly  dysarthric.  NEUROLOGIC:  Cranial nerves appear to be intact grossly.  EXTREMITIES:  Lower extremities are further evaluated today.  Right hip  flexors are 5/5.  Left hip flexors 4-/5.  Hip flexion extension is in  the 4/5 range bilaterally.  Dorsiflexion is weak bilaterally, 4/5 on the  right, 3+/5 on the left.  Plantar flexion is slightly weaker on the left  as well, 4+/5.   Transitioning from sitting to standing, she displays some difficulty.  Gait is stable, however.  Tandem gait is performed with some difficulty.  Romberg test is not assessed due to safety reasons.   Evaluation of bilateral lower extremities reveals patchy numbness  throughout both lower extremities.   IMPRESSION:  1. Multiple sclerosis with history of balance disorder spasticity,      dysarthria, intermittent bladder dysfunction, visual disturbances,      lower extremity weakness.  2. Significant left lower extremity pain now for several months.  Pain      radiates through the left buttock down the leg posteriorly to the      foot.  3. Mild cervical spondylosis with cervicalgia.  4. Known foraminal stenosis at C6-7 on the left.   PLAN:  The patient is requesting slight reduction in Vicodin.  We will  switch her from 7.5/325 to 500 three times a day.  We will obtain lumbar  MRI to rule out any diskogenic problems, which may be contributing to  her overall pain problems.  We will see her back in a month.           ______________________________  Brantley Stage, M.D.     DMK/MedQ  D:  02/24/2009 13:58:08  T:  02/25/2009 05:07:27  Job #:  [id number]

## 2010-11-20 NOTE — Assessment & Plan Note (Signed)
[name] is back in today.  She is a pleasant, married, 50-year-  old female with a history of multiple sclerosis who is being followed by  Dr. Leotis Morris.   She is back in today, states that last month she was hospitalized for an  exacerbation of her multiple sclerosis.  She had IV steroids for several  days followed by oral steroids.   She states she has gained some weight and overall feels stronger.   She continued to have complaints of cervicalgia, some left upper  extremity pain, low back pain and bilateral lower extremity pain.   She believes her neck is bothering her a bit more than prior to her  fall.  Radiographs of her cervical spine were done on April 15, 2007.  The lumbar spine was negative for any bony injury.  It did show mild  extra scoliosis which was felt possibly due to spasms.  The left  shoulder x-rays were also negative for fracture.  Cervical spine films  showed narrowing of the disk space at C4-5, C5-6, C6-7 with posterior  osteophytic ridging, right foraminal narrowing at C5-6-7 and C6-7 and  left foraminal narrowing at C6-7.   The results of these radiographs were reviewed with her today.   She reports good relief from medication currently.  Her average pain is  between 7-8 on a scale of 10.  Needs assistance with feeding, dressing,  bathing, is independent with toileting.  Needs some assistance with meal  prep, household duties and shopping.  Continues to have some  intermittent numbness and tingling in her extremities.  Admits to some  depression and anxiety.  Denies suicidal ideation.  Reports overall  improvement with walking, not as much weakness in the left ankle.   Reports over 10 pound weight gain with steroids over the last month.   MEDICATIONS PRESCRIBED BY OUR CLINIC:  Include Narco 10/325 one p.o. 3  times a day p.r.n. pain.   PHYSICAL EXAMINATION:  VITAL SIGNS:  On exam today, her blood pressure  is 101/69, pulse 75, respirations 18,  and 98% saturated on room air.  GENERAL:  A well-developed, well-nourished female who appears her stated  age.  She is oriented x3.  Speech is clear.  Affect is bright,  __________ alert and cooperative and pleasant.  Transition from sitting to standing without difficulty today.  Gait has  improved from last visit.  Foot drop on left not as apparent.  Balance  is still compromised.  Roberts test and tandem gait are not tested today  due to her overall poor balance.  Her motor strength in the lower extremities is in the 5/5 range with the  exception of everters bilaterally.  Her left ankle manual muscle testing  is a grade stronger than it was at last visit.  Upper extremities are in the 5/5 range without focal weakness.  Patchy areas of decreased sensation in the upper and lower extremities.   IMPRESSION:  1. Status post fall with continued neck pain.  Radiographs did not      show any fracture.  Would like to consider cervical magnetic      resonance image scan.  Apparently this was ordered while she was      hospitalized per Dr. Tinnie Morris, but had not been completed for      reasons not clear on.  The patient states that she believes though      that there was a magnetic resonance image scan ordered.  I do  not      have records regarding any of this hospitalization and am going by      the patient's verbal report here today.  2. Multiple sclerosis.  Balance disorder secondary to multiple      sclerosis.  3. 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  4. Bilateral lower extremity weakness, especially with everters,      overall improvement in dorsiflexors on the left.  5. History of depression/anxiety.   PLAN:  1. Have considering ordering magnetic resonance image scan.  The      patient would like to defer this currently.  I have considered      ordering her an AFO, again she would like to wait regarding this as      well.  She states finances are tight for she and her  husband and      she will let me known when she would consider further diagnostic      testing.  2. Will refill her medications for her today.  Narco 10/325 one p.o. 3      times a day p.r.n. pain.           ______________________________  Sierra Morris, M.D.     DMK/MedQ  D:  05/13/2007 14:32:18  T:  05/14/2007 08:01:52  Job #:  [id number]

## 2010-11-20 NOTE — Assessment & Plan Note (Signed)
[name] is a 50 year old married female who is a patient of Dr.  Tinnie Gens, who is her neurologist.   She was last seen by me on September 24, 2006.  She follows up in our [facility] for multiple pain complaints.  She has  multiple sclerosis and has many pain complaints related to that  particular diagnosis.   She is back in today and complains of some cervicalgia, parascapular  pain, upper extremity discomfort especially around the elbows, bilateral  lower extremity pain.   Her chief complaint, however, today is she is frustrated about her  limitations regarding her mobility in the community.   She states when she is up for a particularly longer period of time she  gets fatigued.  She was unable to go shopping during some sales at one  of the local department stores because she just knew she would not be  able to last physically.  She states that both right and left foot tend  to drop after she has been walking for a period of time.  She complains  of balance problems and has concerns about falling.  She has attempted  to use a cane, but when she puts more weight through the cane she finds  that she loses her balance and is concerned that the cane may not  actually keep her from falling.  As a results she feels as though she is  having to stay home more and is missing quite a bit in the community.   She states her average pain is about a 6 to a 7 on a scale of 10.  Her  sleep is fair.  She gets good relief from the current meds that she is  on at this point.  She states that she is again taking steroids.   PAST MEDICAL HISTORY:  Significant for cholecystectomy within the last  month.  This was rather a sudden onset and she underwent removal of the  gallbladder without any untoward effects so far.  She states she feels  much better now that it is out.  She does report postoperatively she had  some difficulty with her left eye focusing, and is following up with  Dr.  Tinnie Gens for this.   SOCIAL AND FAMILY HISTORY:  Otherwise unchanged.   MEDICATIONS:  Prescribed by this clinic include Norco 10/325 three times  a day.   PHYSICAL EXAMINATION:  Her blood pressure is 121/68, pulse is 14,  respirations 16, 100% saturated on room air.  She is a thin adult female who does not appear in any distress.  She is  oriented x3.  Her affect is bright, alert.  She is smiling, she is  cooperative and pleasant.  Her speech is clear.  She follows commands  without any difficulty.   She is able to transition from sitting to standing without difficulty.  Her gait in the room is remarkable for poor balance and lack of evertor  stabilization during her gait cycle.  With prolonged walking she does  tend to drop her foot variably.   Her motor strength in the upper extremities and lower extremities is for  the most part in the 5/5 range, although she has weak dorsiflexion and  evertors bilaterally.   On formal testing of her balance she has difficulty with tandem gait and  Romberg test.  She has patchy sensory deficits in both upper extremities  and lower extremities.   IMPRESSION:  1. Multiple sclerosis.  2.  Balance disorder secondary to #1.  3. Neuropathic lower extremity pain.  4. Bilateral lower extremity weakness, especially around the ankles.  5. Multiple pain complaints including bilateral upper extremity pain,      intrascapular pain, lower extremity pain.  6. History of depression and anxiety.   PLAN:  I will refill her Norco 10/325 up to four times a day #120.   She has expressed a significant amount of frustration today regarding  her inability to negotiate community distances with respect to her  mobility.  She does have a fairly significant balance disorder and  weakness around both of her ankles.  She has attempted to use a cane,  but this does throw her balance off further and she has concerns about  falling.   To that end, I would like to  have physical therapy assess her for the  possibility of using [employer] crutches with her, and possibly other  assistive devices that may be amenable to supporting her in a community  setting.  She does have a history of some falls, and may also consider  for community distances only a motorized wheelchair or scooter to allow  her access to being out in the community.  We will send a copy of my  note over to Dr. Tinnie Gens, and we will set her up for an evaluation for  [employer] crutches.           ______________________________  Brantley Stage, M.D.     DMK/MedQ  D:  11/27/2006 14:21:41  T:  11/27/2006 15:33:42  Job #:  [id number]   cc:   Harriette Bouillon  Fax: [phone]

## 2010-11-20 NOTE — Assessment & Plan Note (Signed)
[name] is a 50 year old married female who is being followed in our  pain and rehabilitative clinic for chronic pain complaints related to  cervicalgia and multiple sclerosis.  She was last seen by me on  07/15/07.   At that time she had requested to be tapered off of her hydrocodone and  she was put on a tapering schedule from 4 times a  day down to 3 times a  day.  However, she states that she has tapered herself even more and is  currently taking basically one 5/325 hydrocodone tablet a day.   She reports a little bit of increase in her pain scores.  However,  comparing her pain scores to last visit overall she looks about the  same.  She was 7-8 at the last visit today.  She is 7/10.   Pain was interfering completely with her general activity.  However,  today her scores are a bit better as well.   Pain is typically worse with walking, bending, sitting, inactivity and  standing.  Improves with rest, pacing her activities and medications.   CHIEF COMPLAINT:  Predominantly cervicalgia worse on the left side than  on the right with some radiation into the left upper extremity.  Her  functional status is variable depending on how much her MS is impacting  her.   She does have complaints of bladder control problems, intermittent  numbness, tingling, occasionally trouble walking, spasms.  She admits to  depression and anxiety.  Denies suicidal ideation.   Overall she states that she does feel a bit stronger in the last month  and has gained some weight after being on the steroids.  She does have  some intermittent constipation.   PAST MEDICAL HISTORY:  Remarkable for she is currently on a new  medication for multiple sclerosis through Dr. Reva Bores office.  The  medication is an injection, Copaxone.   SOCIAL/FAMILY HISTORY:  Otherwise unchanged. She does state that  finances have been tight and she is trying to reduce the amount of  doctor visits.   PHYSICAL EXAMINATION:   VITALS:  On exam today her blood pressure is  106/54, pulse 78, respirations 18, 96% saturation on room air.  GENERAL:  She is a thin adult female who does not appeared in any  distress.  She is oriented x 3.  Her speech is clear.  Her affect is  bright.  She is alert, cooperative and pleasant.  She follows commands  easily.  EXTREMITIES:  Her gait in the room is more stable than it had been in  previous visits. Balance is overall somewhat improved as well.  She is  able to stand with her feet together today with her hands out in front  without starting to fall.  Her motor strength in the lower extremities  seems a bit improved as well especially in the dorsiflexors on the left  somewhat seem a bit stronger, about 4/5 today and last visit they were  approximately in the 3+/4- range.   She does complain of pain in the right posterior cervical region with  rotation to the right.  She has some areas of tenderness throughout the  cervical parascapular muscles.  Her range of motion is mildly limited in  all plains.   IMPRESSION:  1. Mild cervical spondylosis.  Cervical MRI showed posterior end plate      osteophytes at C6-7 with prevertebral osteophytes and mild central      and mild to moderate bilateral  foraminal stenosis at the C6-7      level.  2. Multiple sclerosis with balance disorder and lower extremity      weakness.  3. Intermittent left upper extremity pain most likely secondary to C6-      7 foraminal stenosis.   PLAN:  In accordance with her wishes she would like to discontinue her  hydrocodone and trial Ultracet.  She understands there is possibility of  interaction with Prozac and will start her taking 1-2 tablets a day and  if she tolerates it she can go up to 4 tablets a day of Ultracet.  Will  give her a refill and will see her back in 2 months.  She will  discontinue the medicine if she has any adverse side effects from it.  Toward the end of the visit she mentioned  she had a rash and little  vesicles above her left eye and around her right and left eye which are  itchy and somewhat burning.  I asked her, questioned whether this may be  herpes simplex ophthalmic branch and asked her to follow-up with Dr.  Tinnie Gens since she may be on some immunosuppressive therapy.  She may  need to follow-up with Ophthalmologist if there are any visual problems.  She will give Dr. Tinnie Gens a call regarding this.           ______________________________  Brantley Stage, M.D.     DMK/MedQ  D:  08/12/2007 14:11:14  T:  08/13/2007 08:28:01  Job #:  [id number]

## 2010-11-20 NOTE — Assessment & Plan Note (Signed)
[name] is a 50 year old married female who is followed in our Pain  and Rehabilitative Clinic for chronic pain complaints related to  cervical spondylosis and multiple sclerosis.  She is a patient of Dr.  Leotis Shames, who has treated her multiple sclerosis for quite a while now.   Earlier this year, she had requested to be tapered off of hydrocodone in  an attempt to manage her pain non-narcotically.  She was last seen by me  on October 07, 2007, at that time she had been placed on a small dose of  Ultracet.  Over the last several months, she states that her pain has  not been well managed just on the Ultracet and in fact she has trialled  other combinations through Dr. Tessa Lerner office to manage her pain and  spasticity.  She is currently on Tegretol 200 mg at night; Neurontin 300  mg 1 in the morning, 2 during the day, and 2 tablets at night for a  total of not more than 1800 mg a day.  She is also taking baclofen two  10 mg tablets, which she takes in the evening.  She has been on 1  Vicodin as well, 5/500 dosage, and reports overall medications are  really not helping her pain that much, and she would like to go back on  the previous medications she had been on prior to tapering off Vicodin  at our clinic earlier this year.  She states that in addition to the  above medications, she has been taking many over-the-counter  medications; in fact, she cannot tell me really what the dosages are of  these medicines.  She takes Goody's Powder couple of times a day as well  as extra-strength Excedrin couple of times a day, p.r.n. Aleve and  Advil, and extra-strength Tylenol.   She reports her average pain currently is about a 7 on a scale of 10,  localized to the cervical region with some radiation in her left upper  extremity.  Bilateral lower extremity spasms and pain are also noted.  Her balance has overall been poor, as well she occasionally will use a  cane.   Pain is described as sharp,  burning, dull, stabbing, tingling, and  aching in nature.  She has some numbness in both hands, particularly  over the radial aspect of her hand.  Bilateral lower extremity numbness  is also noted, especially below the knees and in the feet.   Sleep tends to be fair.   Pain is typically worse with activity, improves with rest, pacing her  activities, and medication.  She reports a little relief with current  meds prescribed.   FUNCTIONAL STATUS:  She is able to walk a very little amount of time  depending on the day.  She is able to climb stairs and drive at times.  She occasionally will use the cane.  She is independent with feeding and  toileting, sometimes will need assistance with dressing, bathing, meal  prep, and household duties.   Admits to problems with bladder and bowel, numbness, tingling, tremors,  spasms, dizziness, confusion, depression, and anxiety.  Denies suicidal  ideation.   Also, review of systems positive for nausea and constipation.   Medications she brings in today are the following:  1. She is on Prozac 10 mg 2 p.o. daily.  2. Carbatrol ER 200 mg nightly.  3. Neurontin 300 mg 1 in the morning, 1 in the afternoon, 1 in the  early evening, and 2 at night.  4. Ondansetron 4 mg p.r.n.  5. Baclofen 10 mg typically 2 tablets nightly, occasionally 1 or 2      additional pills will be taken during the day on a p.r.n. basis.  6. Hydrocodone 5/500 one p.o. nightly.  7. Alprazolam 0.5 one half to one tablet on a p.r.n. basis.   No other changes in social or family histories since her last visit.   PHYSICAL EXAMINATION:  VITAL SIGNS:  Blood pressure is 111/74, pulse 79,  respiration 18, and 100% saturated on room air.  GENERAL:  She is a thin adult female who appears her stated age and does  not appear in any distress.  She is oriented x3.  Her speech is slightly  dysarthric.  Her affect is bright, cooperative, and pleasant.  Follows  commands without  difficulty.   Some difficulty, needs to push up with hands getting out of the chair.  She has mild limitations in cervical range of motion, complains of pain  with rotation and extension, especially in the left paraspinal and left  upper trapezius region.  She has limited range of motion in her  shoulders, is able to abduct about 90 degrees bilaterally, reports some  discomfort in the parascapular region with this activity.   She has brisk reflexes, no clonus is noted today.  She has patchy  sensation in the upper extremities with light touch, as well as below  the knees decreased sensation is noted as well.   Coordination is compromised.  She has difficulty with tandem gait.  Romberg test is not assessed for safety reasons.   Her motor strength is remarkable for 4/5 strength in the left triceps  and 4/5 strength in left hamstring.  She has bilaterally weak  dorsiflexors and evertors in the lower extremities.  Her hip flexion is  compromised as well, 3+ to 4-/5 hip flexion is noted bilaterally.   IMPRESSION:  1. Multiple sclerosis with balance disorder, spasticity, and upper and      lower extremity weakness.  2. Mild cervical spondylosis.  Previous MRI had shown posterior      endplate osteophytes at C6-7, mild-to-moderate bi-foraminal      stenosis at C6-7 as well.  3. Intermittent left upper extremity pain, may be secondary to the C6-      7 foraminal stenosis.  4. Significant over-the-counter pain medication use.   PLAN:  I reviewed appropriate doses of acetaminophen with her, and she  will reduce the amount of over-the-counter meds she is currently taking.  We will switch her from Vicodin 5/500 once a day to 7.5/500 three times  a day.  She understands the risks and benefits of this medication,  caution to her regarding driving while on this medicine.   I have also recommended for spasticity she began stretching several  times a day, and I reviewed some of the basic  stretches with her in the  office as well.  I have recommended physical therapy program to assist  her in this; however, she would like to defer it for financial reasons.  She is also given a prescription for Motrin 7.5 mg 1 p.o. daily p.r.n.,  #15, for intermittent neck and left upper extremity pain.  She states  she has not been on any steroids since August.  I will see her back in a  month.           ______________________________  Brantley Stage, M.D.  DMK/MedQ  D:  05/20/2008 09:52:06  T:  05/20/2008 22:28:31  Job #:  [id number]   cc:   Harriette Bouillon  Fax: [phone]

## 2010-11-20 NOTE — Assessment & Plan Note (Signed)
[name] is a 50 year old married woman who is followed in our  Pain and Rehabilitative Clinic for chronic pain complaints related to  cervical spondylosis and multiple sclerosis.  She is a patient of Dr.  Tinnie Morris who has been treating her multiple sclerosis for quite a long  time now.   She is back in today for refill of her hydrocodone.  She was trialed on  Mobic last month and reports that this did not really help with pain  complaints at all.   Her average pain is about 7 on a scale of 10, is described as variable  in nature, sharp burning, dull, stabbing, tingling, and aching,  interfering significantly with activity levels.  Pain is typically worse  with most activities, improves with rest, pacing her activities and  medication.   Overall, she states, however, in the last month, she has had more energy  and has actually been able to get out Christmas shopping and do more  than she has for many months now.   FUNCTIONAL STATUS:  The amount she can walk is variable depending on the  day.  Some days she can climb stairs, sometimes she cannot.  She is  independent with feeding and toileting.  She occasionally needs help  with dressing and bathing as well as with meal prep.   REVIEW OF SYSTEMS:  Positive for variations, inability to control her  bladder, numbness, tremors, tingling, trouble walking, dizziness,  confusion, depression, anxiety.  Denies suicidal ideation.   PHYSICIANS INVOLVED IN HER CARE:  1. Dr. Keturah Morris is her primary care.  2. Dr. Harriette Morris is her neurologist.  3. Sierra Morris, psychiatrist.   No other changes in past medical, social, or family history since her  last visit.   MEDICATIONS:  Medications provided through this clinic include Mobic 7.5  on a p.r.n. basis.  The patient has discontinued this.  Vicodin 7.5/500  up to 3 times a day.   Other medications from other physicians, she is currently taking,  include Neurontin  300 mg 1 p.o. q.a.m., baclofen 10 mg p.o. at bedtime,  Ritalin long acting 10 mg daily, and Prozac 40 or 60 mg 1 p.o. daily.  Carbatrol has been discontinued.  She is also on alprazolam 0.25 t.i.d.  to q.i.d. per Dr. Claudie Morris.   PHYSICAL EXAMINATION:  VITAL SIGNS:  Today, blood pressure is 103/67,  pulse 70, respirations 18, and 100% saturated on room air.  GENERAL:  She is a thin adult female who did not appear in any distress.  She is oriented x3.  Speech is clear.  Affect is bright.  She is alert,  cooperative, and pleasant.  She follows commands without difficulty and  answers questions appropriately.   Cranial nerves, dysarthria is noted.  Coordination is decreased in  bilateral upper extremities and lower extremities.   Reflexes are slightly brisk.  No clonus is appreciated today, however,  on manual muscle testing with extension of the right lower extremity,  she did have spasticity noted.   She has slightly decreased sensation in both upper and lower  extremities.   She has mild limitation with cervical range of motion, full shoulder  range of motion is noted, mild limitations with lumbar range of motion  are also noted.   Motor strength is variable overall 5/5 today without focal deficits with  the exception of bilateral dorsiflexors and EHL are weak.   Tandem gait and Romberg test are not tested due to obvious  balance  difficulty.   IMPRESSION:  1. Multiple sclerosis with balance disorder spasticity upper and lower      extremity weakness, dysarthria.  2. Mild cervical spondylosis.  3. Intermittent left upper extremity pain, most likely secondary to      multiple sclerosis or possible mild foraminal stenosis at C6-7.   PLAN:  We refilled her Vicodin 7.5/500 one p.o. t.i.d. p.r.n.  I  requested she bring in all of her other medications, which she is taking  from the other 3 physicians who are caring for her at this time,  cautioned her regarding use of Vicodin and  other medications, which may  be sedating, however, she states that overall in the last couple of  months she has had the best function she has had in quite well.  She did  not have any complaints of oversedation or significant constipation at  this time.  She feels that her function is quite good and feels that the  medications are all working well at this time.  We will see her back in  a month.           ______________________________  Sierra Morris, M.D.     DMK/MedQ  D:  06/17/2008 13:30:00  T:  06/18/2008 01:31:57  Job #:  [id number]   cc:   Sierra Barre, MD   Sierra Loll, MD   Sierra Morris  Fax: [phone]

## 2010-11-20 NOTE — Assessment & Plan Note (Signed)
[name] is a 50 year old married woman with teenage children.  She  is back in today for refill of her medications.  She states that she has  been able to undergo an MRI through Dr. Reva Bores office, and she had  the imaging study recently and has not had the results discussed with  her at this point.   She continues to have cervicalgia as well as low back pain and bilateral  lower extremity pain.  She continues to have some balance difficulties.  She has a long history of multiple sclerosis as well.   Average pain is about 8-9 on a scale of 10.  Pain is described as  constant, sharp, dull, stabbing, tingling and aching in nature.  Sometimes her pain is more intermittent.  Sleep is fair.  Pain is worse  with activity and improves with rest and medication and pacing her  activities.  She gets fair relief with the current medications that she  is on.  She is able to walk a variable amount of time.  She is able to  climb stairs and drive.  She is independent with her self care.  Needs  assistance with higher level household activities.   Admits to intermittent problems with bowel, bladder, numbness, tremors,  tingling, trouble walking, spasms, dizziness, confusion, depression and  anxiety but denies suicidal ideation.   REVIEW OF SYSTEMS:  Noted in chart.  She has had recent weight gain.  She has intermittent problems with nausea, vomiting, diarrhea and  constipation.   She has been recently started on Abilify.   No other changes in her medications are noted.   PAST MEDICAL HISTORY, SOCIAL HISTORY, FAMILY HISTORY:  Otherwise  unchanged from last visit per her report.   ALLERGIES:  1. PAXIL.  2. MORPHINE.  3. LYRICA.   MEDICATIONS PROVIDED BY OUR CLINIC:  Norco 10/325 up to three times  daily p.r.n. neck and back pain.   PHYSICAL EXAMINATION:  Blood pressure 112/58, pulse 70, respirations 18,  saturation 100% on room air.  She is a thin adult female who does not  appear in  any distress.  She is oriented x3.  Her speech is clear.  Her  affect is bright.  She is alert, cooperative and pleasant.  She follows  commands without any difficulties.   She transitions from sitting to standing easily today.  Her gait is  somewhat ataxic.  She has difficulty with Romberg test.  Tandem gait was  not tested.   She has limitations in cervical range of motion especially with forward  flexion and some minimal restrictions noted in rotation both right and  left.   Full shoulder range of motion is appreciated.   Reflexes are  2+ throughout.  No clonus is noted today.  No abnormal  tone is noted today.  She has 4+/5 strength in the upper extremities.  Right hip flexors are a half grade weaker than on the left.  Her  dorsiflexors are also about 3+ to 4-/5 bilaterally.   IMPRESSIONS:  1. Multiple sclerosis with balance disorder and lower extremity      weakness.  2. Status post fall with continued neck pain.  MRI results pending.      Recently ordered by Dr. Tinnie Gens.  3. Neuropathic pain in left upper and lower extremities.  4. History of depression and anxiety.   PLAN:  She is requesting decrease in her Norco dose today.  We will  switch her to 7.5/325 up to three  times daily, #90, no refills.  She  would like to eventually go to the size of 325 Norco and then switch  over to tramadol in the next month or two.  We will see her back next  month regarding AFO.  Finances continue to be tight.  She would like to  wait on this.  We will see her back in a month.           ______________________________  Brantley Stage, M.D.     DMK/MedQ  D:  06/17/2007 14:58:52  T:  06/18/2007 07:45:32  Job #:  [id number]

## 2010-11-23 NOTE — Group Therapy Note (Signed)
HISTORY:  [name] is a married white female who had been diagnosed  approximately 8 years ago with multiple sclerosis.   She has been referred for pain management.  Her main complaint is chronic  low back and left leg pain which radiates down the back of her leg to the  side of her foot.   She has had intermittent leg pain for many years however, over the last  several months she has noted worsening of the left leg pain.  Her average  pain is about a 6 on a scale of 10, the pain is variable in its nature,  sometimes it is constant, sometimes it is intermittent and it varies from  sharp, burning, stabbing, dull, aching.  Sleep is fair.  Pain varies day to  day as to the variation of the time of day in which it is worse.   It is typically worse with walking, sitting and activities, standing.  Improves with pacing her activities and medications.  Relief from her  current medications is fair.  Her mobility varies also, sometimes she can  walk without assistance, sometimes she requires a cane.  She can typically  walk between 10 and 20 minutes at a time.  She is able to climb stairs and  she currently does drive.  She has been disabled.  She is independent with  all of her self-care, occasionally she will need help with household duties  and shopping and on real bad days she may need help even with her self-care.  She admits to troubles with bowel and bladder control, weakness, numbness,  tingling, tremors, trouble walking and spasms.  Occasional confusion,  depression, anxiety.  When asked specifically about suicidal ideation, she  tells me she would not actively attempt suicide but she does sometimes feel  as though she does not want to hurt anymore.  She feels the MS is enough  trouble for her.  She has noted a weight gain and she is getting a little  bit of a skin rash from the shot she takes.  She does admit to some nausea  and constipation occasionally as well.   Physicians  involved in her care include Dr. Feliciana Rossetti, Dr. Harriette Bouillon  and Dr. Cyril Loosen, psychiatrist.   PAST MEDICAL HISTORY:  Negative for diabetes, ulcers, cancers, kidney  problems, thyroid problems, heart problems or high blood pressure.   PAST SURGICAL HISTORY:  Tonsillectomy in 1975, appendectomy 1980 and sinus  surgery in 2004.   SOCIAL HISTORY:  She is married, has three children, 14 year old twin boys  and a 61 year old daughter.  She drinks occasionally and smokes about a half  a pack of cigarettes a day.  Denies illegal drug use.   FAMILY HISTORY:  Remarkable for diabetes.   EXAMINATION:  Well-developed, well-nourished woman who does not appear in  any distress during our interview.  She is cooperative and pleasant,  oriented x3, affect is overall bright.  Her blood pressure is 114/81, pulse  79, respirations 14, 100% saturated on room air.  She is able to stand  independently after being seated.  Her gait is overall stable, narrow based.  Romberg's test however, is positive.  She has quite a bit of sway with her  eyes closed.  She has some coordination difficulties with heel-knee-shin  left worse than right, also some coordination problems in the upper  extremity as well which are mild.  Her strength overall is quite good 5/5 in  the upper and lower  extremities, no obvious focal weakness.  Reflexes are 2+  throughout.  No significantly abnormal tone is noted in the lower  extremities.   IMPRESSION:  1.  Multiple sclerosis.  2.  Sciatic symptoms left lower extremity, pain goes down into the left calf      and lateral foot area.   PLAN:  We will start her on some Lidoderm 5% one to three patches 12 hours  on/12 hours off (#90).  She would like to taper off the Neurontin.  She  finds it quite sedating.  She will drop one pill every 4 days.  We will see  her back and we will likely start her on some Topamax, possibly Lyrica at  some point.  She will continue taking trazodone  50 mg at bedtime, Effexor 75  mg once daily, she is also on Tegretol 200 mg at bedtime (this is apparently  for her intermittent left eye pain).   We will also obtain a lumbar MRI to rule out any disk herniation at the left  L5 or S1 root level.  Patient also mentions she has been on a variety of  medications in the past to treat her pain and I would like her to make a  list of the things she has indeed tried over the past couple years so we can  see what she has trialed.  We will see her back then in a month.      DMK/MedQ  D:  10/17/2004 17:34:09  T:  10/17/2004 22:37:23  Job #:  [id number]

## 2011-06-19 ENCOUNTER — Other Ambulatory Visit (HOSPITAL_COMMUNITY): Payer: Self-pay | Admitting: *Deleted

## 2011-06-24 ENCOUNTER — Ambulatory Visit (HOSPITAL_COMMUNITY)
Admission: RE | Admit: 2011-06-24 | Discharge: 2011-06-24 | Disposition: A | Discharge status: Home / Self Care | Payer: Medicare Other | Source: Ambulatory Visit | Attending: Obstetrics and Gynecology | Admitting: Obstetrics and Gynecology

## 2011-06-24 DIAGNOSIS — M810 Age-related osteoporosis without current pathological fracture: Secondary | ICD-10-CM | POA: Insufficient documentation

## 2011-06-24 MED ORDER — ZOLEDRONIC ACID 5 MG/100ML IV SOLN
5.0000 mg | Freq: Once | INTRAVENOUS | Status: AC
  Administered 2011-06-24: 5 mg via INTRAVENOUS
  Filled 2011-06-24: qty 100

## 2012-08-14 ENCOUNTER — Other Ambulatory Visit (HOSPITAL_COMMUNITY): Payer: Self-pay | Admitting: *Deleted

## 2012-08-17 ENCOUNTER — Encounter (HOSPITAL_COMMUNITY)
Admission: RE | Admit: 2012-08-17 | Discharge: 2012-08-17 | Disposition: A | Discharge status: Home / Self Care | Payer: Medicare Other | Source: Ambulatory Visit | Attending: Obstetrics and Gynecology | Admitting: Obstetrics and Gynecology

## 2012-08-17 DIAGNOSIS — M810 Age-related osteoporosis without current pathological fracture: Secondary | ICD-10-CM | POA: Insufficient documentation

## 2012-08-17 MED ORDER — ZOLEDRONIC ACID 5 MG/100ML IV SOLN
5.0000 mg | Freq: Once | INTRAVENOUS | Status: AC
  Administered 2012-08-17: 5 mg via INTRAVENOUS

## 2012-08-17 MED ORDER — ZOLEDRONIC ACID 5 MG/100ML IV SOLN
INTRAVENOUS | Status: AC
  Administered 2012-08-17: 5 mg via INTRAVENOUS
  Filled 2012-08-17: qty 100

## 2012-08-25 ENCOUNTER — Telehealth: Payer: Self-pay

## 2012-08-25 NOTE — Telephone Encounter (Signed)
Patient would like a recommendation for a therapist for a teenager.  Please advise.

## 2012-08-26 NOTE — Telephone Encounter (Signed)
Unable to contact patient regarding question about therapist.  Per Dr Pamelia Hoit patient should contact primary care giver.

## 2012-08-26 NOTE — Telephone Encounter (Signed)
Please clarify what kind of therapist she is looking for, would recommend first talking to pediatrician or family practice physician regarding the teenager.

## 2013-08-17 ENCOUNTER — Encounter (HOSPITAL_COMMUNITY): Payer: Medicare Other

## 2013-09-28 ENCOUNTER — Other Ambulatory Visit (HOSPITAL_COMMUNITY): Payer: Self-pay | Admitting: *Deleted

## 2013-09-29 ENCOUNTER — Encounter (HOSPITAL_COMMUNITY)
Admission: RE | Admit: 2013-09-29 | Discharge: 2013-09-29 | Disposition: A | Discharge status: Home / Self Care | Payer: Medicare Other | Source: Ambulatory Visit | Attending: Obstetrics and Gynecology | Admitting: Obstetrics and Gynecology

## 2013-09-29 DIAGNOSIS — M810 Age-related osteoporosis without current pathological fracture: Secondary | ICD-10-CM | POA: Diagnosis not present

## 2013-09-29 MED ORDER — ZOLEDRONIC ACID 5 MG/100ML IV SOLN
5.0000 mg | Freq: Once | INTRAVENOUS | Status: AC
  Administered 2013-09-29: 5 mg via INTRAVENOUS

## 2013-09-29 MED ORDER — ZOLEDRONIC ACID 5 MG/100ML IV SOLN
INTRAVENOUS | Status: AC
  Administered 2013-09-29: 5 mg via INTRAVENOUS
  Filled 2013-09-29: qty 100

## 2013-10-28 ENCOUNTER — Other Ambulatory Visit: Payer: Self-pay | Admitting: Psychiatry

## 2013-10-28 DIAGNOSIS — M549 Dorsalgia, unspecified: Secondary | ICD-10-CM

## 2013-10-28 DIAGNOSIS — G35 Multiple sclerosis: Secondary | ICD-10-CM

## 2013-10-31 ENCOUNTER — Other Ambulatory Visit: Payer: Medicare Other

## 2013-11-05 ENCOUNTER — Other Ambulatory Visit: Payer: Medicare Other

## 2013-12-20 ENCOUNTER — Other Ambulatory Visit (HOSPITAL_COMMUNITY): Payer: Self-pay | Admitting: Obstetrics and Gynecology

## 2013-12-20 DIAGNOSIS — R1032 Left lower quadrant pain: Secondary | ICD-10-CM

## 2013-12-23 ENCOUNTER — Ambulatory Visit (HOSPITAL_COMMUNITY)
Admission: RE | Admit: 2013-12-23 | Discharge: 2013-12-23 | Disposition: A | Discharge status: Home / Self Care | Payer: Medicare Other | Source: Ambulatory Visit | Attending: Obstetrics and Gynecology | Admitting: Obstetrics and Gynecology

## 2013-12-23 ENCOUNTER — Encounter (HOSPITAL_COMMUNITY): Payer: Self-pay

## 2013-12-23 DIAGNOSIS — Z9089 Acquired absence of other organs: Secondary | ICD-10-CM | POA: Insufficient documentation

## 2013-12-23 DIAGNOSIS — Z90710 Acquired absence of both cervix and uterus: Secondary | ICD-10-CM | POA: Diagnosis not present

## 2013-12-23 DIAGNOSIS — R1032 Left lower quadrant pain: Secondary | ICD-10-CM

## 2013-12-23 HISTORY — DX: Multiple sclerosis: G35

## 2013-12-23 MED ORDER — IOHEXOL 300 MG/ML  SOLN
100.0000 mL | Freq: Once | INTRAMUSCULAR | Status: AC | PRN
  Administered 2013-12-23: 100 mL via INTRAVENOUS

## 2014-06-01 ENCOUNTER — Encounter: Payer: Self-pay | Admitting: Internal Medicine

## 2014-06-07 ENCOUNTER — Telehealth: Payer: Self-pay | Admitting: Internal Medicine

## 2014-06-07 NOTE — Telephone Encounter (Signed)
Re'cd records from [facility] Forward[address]Pyrtle

## 2014-07-18 ENCOUNTER — Encounter: Payer: Self-pay | Admitting: *Deleted

## 2014-07-27 ENCOUNTER — Encounter: Payer: Self-pay | Admitting: Internal Medicine

## 2014-07-27 ENCOUNTER — Ambulatory Visit (INDEPENDENT_AMBULATORY_CARE_PROVIDER_SITE_OTHER): Payer: Medicare Other | Admitting: Internal Medicine

## 2014-07-27 VITALS — BP 100/60 | HR 70 | Ht 60.0 in | Wt 98.2 lb

## 2014-07-27 DIAGNOSIS — K5900 Constipation, unspecified: Secondary | ICD-10-CM

## 2014-07-27 DIAGNOSIS — K625 Hemorrhage of anus and rectum: Secondary | ICD-10-CM

## 2014-07-27 DIAGNOSIS — R933 Abnormal findings on diagnostic imaging of other parts of digestive tract: Secondary | ICD-10-CM

## 2014-07-27 DIAGNOSIS — R109 Unspecified abdominal pain: Secondary | ICD-10-CM

## 2014-07-27 MED ORDER — LUBIPROSTONE 8 MCG PO CAPS: 8.0000 ug | ORAL_CAPSULE | Freq: Two times a day (BID) | ORAL | Status: DC

## 2014-07-27 MED ORDER — MOVIPREP 100 G PO SOLR: 1.0000 | Freq: Once | ORAL | Status: DC

## 2014-07-27 NOTE — Progress Notes (Signed)
Patient ID: Sierra Morris, female   DOB: Oct 18, 1960, 54 y.o.   MRN: [id number] HPI: [name] is a 54 yo female with PMH of MS, chronic pain syndrome, fibromyalgia, diverticulosis with diverticulitis, reported history of colon polyps who was seen in consultation at the request of Dr. Janace Litten to evaluate abdominal pain, constipation and abnormal GI imaging. She is here alone today. She reports in the last 6 months she has had increasing problems with constipation as well as left-sided abdominal pain. She's had 2 CT scans performed in the last several months the last in November 2015 which showed left colonic thickening raising the question of colitis. She was treated empirically for diverticulitis in the fall of 2015 and then antibiotics were started again after the colon getting seen by CT in November. She took Flagyl and what sounds like a macrolide antibiotic, possibly Levaquin. She continues to report some vague left-sided abdominal discomfort and constipation. She is using docusate 1-2 tablets daily. She is having bowel movements every 1-3 days. She occasionally sees blood with wiping. She has on and off trouble with nausea, time severe and this can lead to vomiting. Nonbloody emesis. She does have lower abdominal cramping pain relieved by defecation. Through this she reports a good appetite though she has lost 5-10 pounds in the last 6-12 months. She is followed by neurology, psychiatry, and is being referred to pain clinic. She denies dysphagia or odynophagia. There are time she says she can feel a "knot or ridge" in her left lower quadrant.  Pushing on this at times helps with BM.  Prior GI evaluation with colonoscopy performed by Dr. Odie Sera around 2010. She recalls possible history of colon polyps.  I do not have this report available today. Her paternal grandfather had colorectal cancer and her parents have had colon polyps.  Past Medical History  Diagnosis Date  . MS (multiple  sclerosis)   . Chronic depression   . Anorexia nervosa   . Pituitary microadenoma   . Sleep disorder   . Emphysema/COPD   . Tremor   . Osteoarthritis   . Hyperthyroidism   . Osteoporosis   . Diverticulosis   . Anxiety   . Arthritis   . Chronic headaches   . Colitis   . Fibromyalgia     Past Surgical History  Procedure Laterality Date  . Cholecystectomy    . Oophorectomy    . Cesarean section      x 2  . Appendectomy    . Tonsillectomy    . Dental surgery    . Abdominal hysterectomy  2008    No outpatient prescriptions prior to visit.   No facility-administered medications prior to visit.    Allergies  Allergen Reactions  . Paroxetine Hcl Other (See Comments)    Crying, depressed  . Pregabalin Itching  . Suboxone [Buprenorphine Hcl-Naloxone Hcl] Other (See Comments)    Hallucinations            Family History  Problem Relation Age of Onset  . Depression Father   . Diabetes Father   . Colon cancer Paternal Grandmother   . Colon polyps Mother   . Colon polyps Father   . COPD Father   . Hypertension Mother   . Diabetes Maternal Grandmother   . Pancreatitis Son   . Rectal cancer Paternal Grandmother   . Irritable bowel syndrome      cousin    History  Substance Use Topics  . Smoking status: Current Every Day  Smoker    Types: Cigarettes  . Smokeless tobacco: Never Used  . Alcohol Use: 0.0 oz/week    0 Not specified per week     Comment: rare    ROS: As per history of present illness, otherwise negative  BP 100/60 mmHg  Pulse 70  Ht 5' (1.524 m)  Wt 98 lb 3.2 oz (44.543 kg)  BMI 19.18 kg/m2 Constitutional: Well-developed and well-nourished though thin. No distress. HEENT: Normocephalic and atraumatic. Oropharynx is clear and moist. No oropharyngeal exudate. Conjunctivae are normal.  No scleral icterus. Neck: Neck supple. Trachea midline. Cardiovascular: Normal rate, regular rhythm and intact distal pulses. No M/R/G Pulmonary/chest:  Effort normal and breath sounds normal. No wheezing, rales or rhonchi. Abdominal: Soft, schapoid, nontender, nondistended. Bowel sounds active throughout. There are no masses palpable. No hepatosplenomegaly. Extremities: no clubbing, cyanosis, or edema Lymphadenopathy: No cervical adenopathy noted. Neurological: Alert and oriented to person place and time. Skin: Skin is warm and dry. No rashes noted. Psychiatric: Normal mood and affect. Behavior is normal.  RELEVANT LABS AND IMAGING: CT scan dated 05/17/2014 with contrast, diverticulosis of the sigmoid without evidence of diverticulitis. There is wall thickening involving the sigmoid likely representing an acute colitis of infectious or inflammatory origin.  Labs dated 05/12/2014 -- CBC within normal limits, hemoglobin 14.6, CMP within normal limits, lipase 45  CLINICAL DATA:  Left lower quadrant pain. Prior hysterectomy, appendectomy, and cholecystectomy.   EXAM: CT ABDOMEN AND PELVIS WITH CONTRAST   TECHNIQUE: Multidetector CT imaging of the abdomen and pelvis was performed using the standard protocol following bolus administration of intravenous contrast.   CONTRAST:  173mL OMNIPAQUE IOHEXOL 300 MG/ML  SOLN   COMPARISON:  08/17/2009   FINDINGS: Surgical clips from prior cholecystectomy noted. No evidence of biliary dilatation. The liver, spleen, pancreas, adrenal glands, and kidneys are normal in appearance. No evidence of hydronephrosis. Prior hysterectomy noted. Adnexal regions are unremarkable in appearance.   No soft tissue masses or lymphadenopathy identified within the abdomen or pelvis. No evidence of inflammatory process or abnormal fluid collections. No evidence of bowel wall thickening or dilatation. Significant decrease in colonic stool burden noted since prior exam. No suspicious bone lesions identified.   IMPRESSION: Negative. No acute findings or other significant abnormality identified in abdomen or  pelvis.     Electronically Signed   By: Earle Gell M.D.   On: 12/23/2013 10:23  ASSESSMENT/PLAN: 54 yo female with PMH of MS, chronic pain syndrome, fibromyalgia, diverticulosis with diverticulitis, reported history of colon polyps who was seen in consultation at the request of Dr. Janace Litten to evaluate abdominal pain, constipation and abnormal GI imaging.  1. Left-sided abdominal pain/constipation/abnormal GI imaging -- the patient has a history of diverticulosis and constipation. Her constipation is likely worsened by narcotic pain medication. She did have possibility of sigmoid thickening raising the question of colitis on a CT scan 2 months ago. For this reason and given her minor rectal bleeding I recommended colonoscopy. We discussed the test including the risks and benefits and she is agreeable to proceed. I'm also starting her lubiprostone 8 g twice daily for chronic constipation. Labs and imaging reviewed. SCAD is a possibility which colonoscopy will help to further evaluate.  Further recommendations after procedure.

## 2014-07-27 NOTE — Patient Instructions (Signed)
You have been scheduled for a colonoscopy. Please follow written instructions given to you at your visit today.  Please pick up your prep kit at the pharmacy within the next 1-3 days. If you use inhalers (even only as needed), please bring them with you on the day of your procedure. Your physician has requested that you go to www.startemmi.com and enter the access code given to you at your visit today. This web site gives a general overview about your procedure. However, you should still follow specific instructions given to you by our office regarding your preparation for the procedure.  We have sent the following medications to your pharmacy for you to pick up at your convenience: Amitiza 8 mcg-Take 1 capsule twice daily  CC:Dr Unk Lightning

## 2014-08-01 ENCOUNTER — Telehealth: Payer: Self-pay | Admitting: Internal Medicine

## 2014-08-01 NOTE — Telephone Encounter (Signed)
Patient states she needs to cancel her procedure for now because prep is too expensive. She was offered a free moviprep voucher but states that she lives a while away and is unable to get this. I advised that pharmacy may take voucher over the phone. She stats that she would rather reschedule and get everything figured out first prior to having procedure. Procedure cancelled for now.

## 2014-08-02 ENCOUNTER — Telehealth: Payer: Self-pay | Admitting: Internal Medicine

## 2014-08-02 NOTE — Telephone Encounter (Signed)
I have advised patient that I am glad to provide a voucher to the pharmacy once her colonoscopy has been rescheduled. She states she will call back to do this.

## 2014-08-04 ENCOUNTER — Encounter: Payer: Medicare Other | Admitting: Internal Medicine

## 2014-08-04 ENCOUNTER — Telehealth: Payer: Self-pay | Admitting: Internal Medicine

## 2014-08-04 NOTE — Telephone Encounter (Signed)
Patient advised that rx voucher was called to pharmacy and went through for $0. I also sent new instructions to her home address. She verbalizes understanding.

## 2014-08-18 ENCOUNTER — Encounter: Payer: Medicare Other | Admitting: Internal Medicine

## 2014-09-01 ENCOUNTER — Encounter: Payer: Medicare Other | Admitting: Internal Medicine

## 2014-09-08 ENCOUNTER — Other Ambulatory Visit (INDEPENDENT_AMBULATORY_CARE_PROVIDER_SITE_OTHER): Payer: Medicare Other

## 2014-09-08 ENCOUNTER — Encounter: Payer: Self-pay | Admitting: Internal Medicine

## 2014-09-08 ENCOUNTER — Ambulatory Visit (INDEPENDENT_AMBULATORY_CARE_PROVIDER_SITE_OTHER): Payer: Medicare Other | Admitting: Internal Medicine

## 2014-09-08 VITALS — BP 100/70 | HR 92 | Ht 60.25 in | Wt 97.0 lb

## 2014-09-08 DIAGNOSIS — K921 Melena: Secondary | ICD-10-CM | POA: Diagnosis not present

## 2014-09-08 DIAGNOSIS — R933 Abnormal findings on diagnostic imaging of other parts of digestive tract: Secondary | ICD-10-CM | POA: Diagnosis not present

## 2014-09-08 DIAGNOSIS — R109 Unspecified abdominal pain: Secondary | ICD-10-CM

## 2014-09-08 LAB — CBC WITH DIFFERENTIAL/PLATELET
Basophils Absolute: 0 10*3/uL (ref 0.0–0.1)
Basophils Relative: 0.5 % (ref 0.0–3.0)
Eosinophils Absolute: 0.2 10*3/uL (ref 0.0–0.7)
Eosinophils Relative: 2.6 % (ref 0.0–5.0)
HEMATOCRIT: 45 % (ref 36.0–46.0)
Hemoglobin: 15.4 g/dL — ABNORMAL HIGH (ref 12.0–15.0)
Lymphocytes Relative: 40.3 % (ref 12.0–46.0)
Lymphs Abs: 2.5 10*3/uL (ref 0.7–4.0)
MCHC: 34.1 g/dL (ref 30.0–36.0)
MCV: 86.4 fl (ref 78.0–100.0)
MONO ABS: 0.4 10*3/uL (ref 0.1–1.0)
Monocytes Relative: 6.1 % (ref 3.0–12.0)
Neutro Abs: 3.2 10*3/uL (ref 1.4–7.7)
Neutrophils Relative %: 50.5 % (ref 43.0–77.0)
PLATELETS: 267 10*3/uL (ref 150.0–400.0)
RBC: 5.21 Mil/uL — ABNORMAL HIGH (ref 3.87–5.11)
RDW: 13.8 % (ref 11.5–15.5)
WBC: 6.3 10*3/uL (ref 4.0–10.5)

## 2014-09-08 LAB — COMPREHENSIVE METABOLIC PANEL
ALT: 15 U/L (ref 0–35)
AST: 17 U/L (ref 0–37)
Albumin: 5 g/dL (ref 3.5–5.2)
Alkaline Phosphatase: 70 U/L (ref 39–117)
BUN: 12 mg/dL (ref 6–23)
CO2: 31 meq/L (ref 19–32)
Calcium: 10.3 mg/dL (ref 8.4–10.5)
Chloride: 103 mEq/L (ref 96–112)
Creatinine, Ser: 1 mg/dL (ref 0.40–1.20)
GFR: 61.4 mL/min (ref >60.00)
Glucose, Bld: 105 mg/dL — ABNORMAL HIGH (ref 70–99)
Potassium: 4.3 mEq/L (ref 3.5–5.1)
Sodium: 138 mEq/L (ref 135–145)
Total Bilirubin: 0.4 mg/dL (ref 0.2–1.2)
Total Protein: 7.7 g/dL (ref 6.0–8.3)

## 2014-09-08 LAB — HIGH SENSITIVITY CRP: CRP, High Sensitivity: 0.61 mg/L (ref 0.000–5.000)

## 2014-09-08 NOTE — Progress Notes (Signed)
   Subjective:    Patient ID: Sierra Morris, female    DOB: 02-11-1961, 54 y.o.   MRN: [id number]  HPI Sierra Morris is a 54 yo female with PMH of diverticulosis with diverticulitis, reported history of colon polyps, MS, chronic pain syndrome and fibromyalgia who is seen in follow-up. She is initially seen in January 2016 to evaluate abdominal pain, constipation and abnormal GI imaging. Colonoscopy was recommended and scheduled which she has canceled 3 times. She continues to have left-sided abdominal discomfort and bowel movements which are erratic. Previously constipation was the biggest issue that she has been taking lubiprostone 8 g twice daily. This has helped a bit but now with more erratic alternating diarrhea and constipation. She hasn't tried the Bentyl. She is seeing rare blood in her stool. She was taking antibiotics, most recently Flagyl but this caused sickness and hallucinations. She denies fever or chills. Appetite has remained good though she has not been able to gain weight.  Previous CT scan the last in November 2015 showed left colonic thickening raising the question of colitis   Review of Systems As per HPI, otherwise negative  Current Medications, Allergies, Past Medical History, Past Surgical History, Family History and Social History were reviewed in [employer] record.     Objective:   Physical Exam BP 100/70 mmHg  Pulse 92  Ht 5' 0.25" (1.53 m)  Wt 97 lb (43.999 kg)  BMI 18.80 kg/m2 Constitutional: Well-developed and thin. No distress. HEENT: Normocephalic and atraumatic. Oropharynx is clear and moist. No oropharyngeal exudate. Conjunctivae are normal.  No scleral icterus. Neck: Neck supple. Trachea midline. Cardiovascular: Normal rate, regular rhythm and intact distal pulses. No M/R/G Pulmonary/chest: Effort normal and breath sounds normal. No wheezing, rales or rhonchi. Abdominal: Soft, scaphoid nontender, nondistended. Bowel sounds active  throughout.  Extremities: no clubbing, cyanosis, or edema Lymphadenopathy: No cervical adenopathy noted. Neurological: Alert and oriented to person place and time. Skin: Skin is warm and dry. No rashes noted. Psychiatric: Normal mood and affect. Behavior is normal.  Cbc, cmp, hsCRP today     Assessment & Plan:  54 yo female with PMH of diverticulosis with diverticulitis, reported history of colon polyps, MS, chronic pain syndrome and fibromyalgia who is seen in follow-up.  1. Right-sided abdominal pain/constipation/abnormal GI imaging -- my recommendation remains as before for colonoscopy. We discussed the test including the risks and benefits and she is agreeable to proceed. I do not think she currently has diverticulitis and thus I'm not resuming antibiotics. I would like her to continue lubiprostone 8 g twice daily to help avoid constipation. She can use Bentyl as previously prescribed to help with lower abdominal cramping pain. Further recommendations after colonoscopy. Colonoscopy to rule out IBD, SCAD and malignancy

## 2014-09-08 NOTE — Patient Instructions (Addendum)
You have been given a separate informational sheet regarding your tobacco use, the importance of quitting and local resources to help you quit.  You have been scheduled for a colonoscopy. Please follow written instructions given to you at your visit today.  Please pick up your prep supplies at the pharmacy within the next 1-3 days. If you use inhalers (even only as needed), please bring them with you on the day of your procedure. Your physician has requested that you go to www.startemmi.com and enter the access code given to you at your visit today. This web site gives a general overview about your procedure. However, you should still follow specific instructions given to you by our office regarding your preparation for the procedure.  Your physician has requested that you go to the basement for the following lab work before leaving today: CBC, CMP, CRP  Please continue Amitiza and Bentyl  CC:Dr Robbin

## 2014-09-09 ENCOUNTER — Other Ambulatory Visit (HOSPITAL_COMMUNITY): Payer: Self-pay | Admitting: *Deleted

## 2014-09-09 ENCOUNTER — Telehealth: Payer: Self-pay | Admitting: Internal Medicine

## 2014-09-09 NOTE — Telephone Encounter (Signed)
Pt was seen yesterday and had exam with Pyrtle. States he pushed on her stomach yesterday and today her pain is worse. States it is a constant pain. She took bentyl around 10am and she also took her hydrocodone and neurontin this am around 8am. Pt wants to know what else she can try for the pain. Dr. Olevia Perches as doc of the day please advise.

## 2014-09-09 NOTE — Telephone Encounter (Signed)
Spoke with pt and she is aware. Pt did not want the Ultram, states she took this in the past and did not tolerate it well.

## 2014-09-09 NOTE — Telephone Encounter (Signed)
Please instruct pt to stay on liquids today to " rest the colon". Take Bentyl 10 mg  Twice today . Call in Tramadol 50mg , #15 1 or 2 po q 6 hrs prn colon pain ( she has Hydrocodone but is causes constipation).

## 2014-09-12 ENCOUNTER — Ambulatory Visit (HOSPITAL_COMMUNITY)
Admission: RE | Admit: 2014-09-12 | Discharge: 2014-09-12 | Disposition: A | Discharge status: Home / Self Care | Payer: Medicare Other | Source: Ambulatory Visit | Attending: Obstetrics and Gynecology | Admitting: Obstetrics and Gynecology

## 2014-09-12 DIAGNOSIS — M810 Age-related osteoporosis without current pathological fracture: Secondary | ICD-10-CM | POA: Diagnosis present

## 2014-09-12 MED ORDER — ZOLEDRONIC ACID 5 MG/100ML IV SOLN
5.0000 mg | Freq: Once | INTRAVENOUS | Status: AC
  Administered 2014-09-12: 5 mg via INTRAVENOUS

## 2014-09-13 MED ORDER — ZOLEDRONIC ACID 5 MG/100ML IV SOLN
INTRAVENOUS | Status: AC
  Filled 2014-09-13: qty 100

## 2014-10-12 ENCOUNTER — Encounter: Payer: Self-pay | Admitting: Internal Medicine

## 2014-10-12 ENCOUNTER — Ambulatory Visit (AMBULATORY_SURGERY_CENTER): Payer: Medicare Other | Admitting: Internal Medicine

## 2014-10-12 VITALS — BP 103/73 | HR 77 | Temp 97.7°F | Resp 13 | Ht 60.0 in | Wt 97.0 lb

## 2014-10-12 DIAGNOSIS — R935 Abnormal findings on diagnostic imaging of other abdominal regions, including retroperitoneum: Secondary | ICD-10-CM | POA: Diagnosis not present

## 2014-10-12 DIAGNOSIS — R109 Unspecified abdominal pain: Secondary | ICD-10-CM

## 2014-10-12 DIAGNOSIS — K6389 Other specified diseases of intestine: Secondary | ICD-10-CM | POA: Diagnosis not present

## 2014-10-12 DIAGNOSIS — D122 Benign neoplasm of ascending colon: Secondary | ICD-10-CM

## 2014-10-12 DIAGNOSIS — Z86010 Personal history of colonic polyps: Secondary | ICD-10-CM | POA: Diagnosis not present

## 2014-10-12 MED ORDER — MESALAMINE 1.2 G PO TBEC: 2.4000 g | DELAYED_RELEASE_TABLET | Freq: Every day | ORAL | Status: DC

## 2014-10-12 MED ORDER — SODIUM CHLORIDE 0.9 % IV SOLN: 500.0000 mL | INTRAVENOUS | Status: DC

## 2014-10-12 NOTE — Patient Instructions (Addendum)
YOU HAD AN ENDOSCOPIC PROCEDURE TODAY AT [facility]:   Refer to the procedure report that was given to you for any specific questions about what was found during the examination.  If the procedure report does not answer your questions, please call your gastroenterologist to clarify.  If you requested that your care partner not be given the details of your procedure findings, then the procedure report has been included in a sealed envelope for you to review at your convenience later.  YOU SHOULD EXPECT: Some feelings of bloating in the abdomen. Passage of more gas than usual.  Walking can help get rid of the air that was put into your GI tract during the procedure and reduce the bloating. If you had a lower endoscopy (such as a colonoscopy or flexible sigmoidoscopy) you may notice spotting of blood in your stool or on the toilet paper. If you underwent a bowel prep for your procedure, you may not have a normal bowel movement for a few days.  Please Note:  You might notice some irritation and congestion in your nose or some drainage.  This is from the oxygen used during your procedure.  There is no need for concern and it should clear up in a day or so.  SYMPTOMS TO REPORT IMMEDIATELY:   Following lower endoscopy (colonoscopy or flexible sigmoidoscopy):  Excessive amounts of blood in the stool  Significant tenderness or worsening of abdominal pains  Swelling of the abdomen that is new, acute  Fever of 100F or higher  For urgent or emergent issues, a gastroenterologist can be reached at any hour by calling [phone].   DIET: Your first meal following the procedure should be a small meal and then it is ok to progress to your normal diet. Heavy or fried foods are harder to digest and may make you feel nauseous or bloated.  Likewise, meals heavy in dairy and vegetables can increase bloating.  Drink plenty of fluids but you should avoid alcoholic beverages for 24  hours.  ACTIVITY:  You should plan to take it easy for the rest of today and you should NOT DRIVE or use heavy machinery until tomorrow (because of the sedation medicines used during the test).    FOLLOW UP: Our staff will call the number listed on your records the next business day following your procedure to check on you and address any questions or concerns that you may have regarding the information given to you following your procedure. If we do not reach you, we will leave a message.  However, if you are feeling well and you are not experiencing any problems, there is no need to return our call.  We will assume that you have returned to your regular daily activities without incident.  If any biopsies were taken you will be contacted by phone or by letter within the next 1-3 weeks.  Please call us at [phone] if you have not heard about the biopsies in 3 weeks.    SIGNATURES/CONFIDENTIALITY: You and/or your care partner have signed paperwork which will be entered into your electronic medical record.  These signatures attest to the fact that that the information above on your After Visit Summary has been reviewed and is understood.  Full responsibility of the confidentiality of this discharge information lies with you and/or your care-partner.  Polyps, diverticulosis, high fiber diet-handouts given  Avoid all NSAIDs (ibuprofen, motrin, aleve, naproxen, aspirin)  Wait biopsy results.  Continue lubiprostone 35mcg twice daily  then begin Lialda 2.4 g daily.  Office follow-up next available, office will call you with appointment date and time.

## 2014-10-12 NOTE — Op Note (Signed)
[place]  [address] [place], [address]   COLONOSCOPY PROCEDURE REPORT  PATIENT: Sierra Morris, Sierra Morris  MR#: [id number] BIRTHDATE: 06-17-1961 , 10  yrs. old GENDER: female ENDOSCOPIST: Jerene Bears, MD PROCEDURE DATE:  10/12/2014 PROCEDURE:   Colonoscopy with biopsy and Colonoscopy with cold biopsy polypectomy First Screening Colonoscopy - Avg.  risk and is 50 yrs.  old or older - No.  Prior Negative Screening - Now for repeat screening. N/A  History of Adenoma - Now for follow-up colonoscopy & has been > or = to 3 yrs.  N/A ASA CLASS:   Class III INDICATIONS:right sided abd pain, abnormal GI imaging (colitis in the sigmoid), history of colon polyps. MEDICATIONS: Monitored anesthesia care and Propofol 250 mg IV  DESCRIPTION OF PROCEDURE:   After the risks benefits and alternatives of the procedure were thoroughly explained, informed consent was obtained.  The digital rectal exam revealed no rectal mass.   The standard adult upper endoscope (chosen due body habitus) was introduced through the anus and advanced to the terminal ileum which was intubated for a short distance. No adverse events experienced.   The quality of the prep was (MoviPrep was used) excellent.  The instrument was then slowly withdrawn as the colon was fully examined.  COLON FINDINGS: Normal examined terminal ileum.  A sessile polyp measuring 3 mm in size was found in the ascending colon.  A polypectomy was performed with cold forceps.  The resection was complete, the polyp tissue was completely retrieved and sent to histology.   There was mild diverticulosis noted in the sigmoid colon with associated petechiae, query mild SCAD.  There was also mild erythema in the distal rectum, query proctitis versus preparation related artifact.  Retroflexed views revealed hypertrophied anal papilla. The time to cecum = 5 min, 34 sec Withdrawal time = 14 min, 09 sec   The scope was withdrawn and  the procedure completed.  COMPLICATIONS: There were no immediate complications.  ENDOSCOPIC IMPRESSION: 1.   Sessile polyp was found in the ascending colon; polypectomy was performed with cold forceps 2.   There was mild diverticulosis noted in the sigmoid colon with mild inflammatory change, query SCAD, multiple biopsies 3.   Mild inflammation versus prep artifact in the rectum, biopsies obtained  RECOMMENDATIONS: 1.  Await biopsy results 2.  Avoid all NSAIDs 3.  Timing of repeat colonoscopy will be determined by pathology findings. 4.  You will receive a letter within 1-2 weeks with the results of your biopsy as well as final recommendations.  Please call my office if you have not received a letter after 3 weeks. 5.   Continue lubiprostone 8 mcg twice daily for constipation and then begin Lialda 2.4 g daily, office followup next available  eSigned:  Jerene Bears, MD 10/12/2014 4:30 PM  cc: the patient, PCP

## 2014-10-12 NOTE — Progress Notes (Signed)
Called to room to assist during endoscopic procedure.  Patient ID and intended procedure confirmed with present staff. Received instructions for my participation in the procedure from the performing physician.  

## 2014-10-12 NOTE — Progress Notes (Signed)
Patient awakening,vss,report to rn 

## 2014-10-12 NOTE — Progress Notes (Signed)
Pt went to restroom after getting dressed in recovery, she had some blood from her rectum, pt states it was just a little on the toilette paper, advised pt to call us if she see more than just a little on the paper or if bleeding starts to progressively get worse-adm

## 2014-10-13 ENCOUNTER — Telehealth: Payer: Self-pay | Admitting: *Deleted

## 2014-10-13 NOTE — Telephone Encounter (Signed)
  Follow up Call-  Call back number 10/12/2014  Post procedure Call Back phone  # [phone]  Permission to leave phone message Yes     Patient questions:  Do you have a fever, pain , or abdominal swelling? No. Pain Score  0 *  Have you tolerated food without any problems? Yes.    Have you been able to return to your normal activities? Yes.    Do you have any questions about your discharge instructions: Diet   No. Medications  No. Follow up visit  No.  Do you have questions or concerns about your Care? No.  Actions: * If pain score is 4 or above: No action needed, pain <4. States bottom and lower stomach is a little sore but no pain or bleeding.  Instructed pt to call back if she starts experiencing pain, if symptoms worsen or any bleeding.

## 2014-10-24 ENCOUNTER — Encounter: Payer: Self-pay | Admitting: Internal Medicine

## 2014-11-15 ENCOUNTER — Other Ambulatory Visit: Payer: Self-pay | Admitting: Internal Medicine

## 2014-11-18 ENCOUNTER — Encounter: Payer: Self-pay | Admitting: *Deleted

## 2014-11-22 ENCOUNTER — Encounter (HOSPITAL_COMMUNITY): Payer: Self-pay | Admitting: Emergency Medicine

## 2014-11-22 ENCOUNTER — Emergency Department (HOSPITAL_COMMUNITY)
Admission: EM | Admit: 2014-11-22 | Discharge: 2014-11-22 | Disposition: A | Discharge status: Home / Self Care | Payer: Medicare Other | Attending: Emergency Medicine | Admitting: Emergency Medicine

## 2014-11-22 ENCOUNTER — Emergency Department (HOSPITAL_COMMUNITY): Payer: Medicare Other

## 2014-11-22 DIAGNOSIS — Z86018 Personal history of other benign neoplasm: Secondary | ICD-10-CM | POA: Insufficient documentation

## 2014-11-22 DIAGNOSIS — M797 Fibromyalgia: Secondary | ICD-10-CM | POA: Insufficient documentation

## 2014-11-22 DIAGNOSIS — R636 Underweight: Secondary | ICD-10-CM | POA: Diagnosis not present

## 2014-11-22 DIAGNOSIS — Z8719 Personal history of other diseases of the digestive system: Secondary | ICD-10-CM | POA: Insufficient documentation

## 2014-11-22 DIAGNOSIS — Z90710 Acquired absence of both cervix and uterus: Secondary | ICD-10-CM | POA: Diagnosis not present

## 2014-11-22 DIAGNOSIS — R109 Unspecified abdominal pain: Secondary | ICD-10-CM

## 2014-11-22 DIAGNOSIS — Z9049 Acquired absence of other specified parts of digestive tract: Secondary | ICD-10-CM | POA: Diagnosis not present

## 2014-11-22 DIAGNOSIS — G35 Multiple sclerosis: Secondary | ICD-10-CM | POA: Insufficient documentation

## 2014-11-22 DIAGNOSIS — J449 Chronic obstructive pulmonary disease, unspecified: Secondary | ICD-10-CM | POA: Diagnosis not present

## 2014-11-22 DIAGNOSIS — M545 Low back pain: Secondary | ICD-10-CM | POA: Diagnosis not present

## 2014-11-22 DIAGNOSIS — G8929 Other chronic pain: Secondary | ICD-10-CM | POA: Insufficient documentation

## 2014-11-22 DIAGNOSIS — F419 Anxiety disorder, unspecified: Secondary | ICD-10-CM | POA: Diagnosis not present

## 2014-11-22 DIAGNOSIS — R51 Headache: Secondary | ICD-10-CM | POA: Diagnosis not present

## 2014-11-22 DIAGNOSIS — Z720 Tobacco use: Secondary | ICD-10-CM | POA: Diagnosis not present

## 2014-11-22 DIAGNOSIS — F329 Major depressive disorder, single episode, unspecified: Secondary | ICD-10-CM | POA: Diagnosis not present

## 2014-11-22 DIAGNOSIS — M1990 Unspecified osteoarthritis, unspecified site: Secondary | ICD-10-CM | POA: Insufficient documentation

## 2014-11-22 DIAGNOSIS — R319 Hematuria, unspecified: Secondary | ICD-10-CM | POA: Diagnosis present

## 2014-11-22 DIAGNOSIS — R1031 Right lower quadrant pain: Secondary | ICD-10-CM | POA: Insufficient documentation

## 2014-11-22 DIAGNOSIS — Z79899 Other long term (current) drug therapy: Secondary | ICD-10-CM | POA: Diagnosis not present

## 2014-11-22 LAB — CBC WITH DIFFERENTIAL/PLATELET
Basophils Absolute: 0 10*3/uL (ref 0.0–0.1)
Basophils Relative: 1 % (ref 0–1)
Eosinophils Absolute: 0.3 10*3/uL (ref 0.0–0.7)
Eosinophils Relative: 5 % (ref 0–5)
HEMATOCRIT: 43.3 % (ref 36.0–46.0)
HEMOGLOBIN: 14.7 g/dL (ref 12.0–15.0)
LYMPHS ABS: 1.8 10*3/uL (ref 0.7–4.0)
Lymphocytes Relative: 35 % (ref 12–46)
MCH: 29.4 pg (ref 26.0–34.0)
MCHC: 33.9 g/dL (ref 30.0–36.0)
MCV: 86.6 fL (ref 78.0–100.0)
MONOS PCT: 9 % (ref 3–12)
Monocytes Absolute: 0.5 10*3/uL (ref 0.1–1.0)
Neutro Abs: 2.6 10*3/uL (ref 1.7–7.7)
Neutrophils Relative %: 50 % (ref 43–77)
Platelets: 254 10*3/uL (ref 150–400)
RBC: 5 MIL/uL (ref 3.87–5.11)
RDW: 14.4 % (ref 11.5–15.5)
WBC: 5.2 10*3/uL (ref 4.0–10.5)

## 2014-11-22 LAB — LIPASE, BLOOD: Lipase: 25 U/L (ref 22–51)

## 2014-11-22 LAB — COMPREHENSIVE METABOLIC PANEL
ALT: 21 U/L (ref 14–54)
ANION GAP: 7 (ref 5–15)
AST: 24 U/L (ref 15–41)
Albumin: 4.3 g/dL (ref 3.5–5.0)
Alkaline Phosphatase: 57 U/L (ref 38–126)
BUN: 10 mg/dL (ref 6–20)
CALCIUM: 9.6 mg/dL (ref 8.9–10.3)
CO2: 26 mmol/L (ref 22–32)
Chloride: 106 mmol/L (ref 101–111)
Creatinine, Ser: 0.79 mg/dL (ref 0.44–1.00)
GFR calc Af Amer: >60 mL/min (ref >60)
GFR calc non Af Amer: >60 mL/min (ref >60)
Glucose, Bld: 97 mg/dL (ref 65–99)
Potassium: 4.2 mmol/L (ref 3.5–5.1)
Sodium: 139 mmol/L (ref 135–145)
TOTAL PROTEIN: 6.5 g/dL (ref 6.5–8.1)
Total Bilirubin: 0.4 mg/dL (ref 0.3–1.2)

## 2014-11-22 LAB — URINALYSIS, ROUTINE W REFLEX MICROSCOPIC
Bilirubin Urine: NEGATIVE
GLUCOSE, UA: NEGATIVE mg/dL
Hgb urine dipstick: NEGATIVE
Ketones, ur: NEGATIVE mg/dL
Leukocytes, UA: NEGATIVE
Nitrite: NEGATIVE
PH: 6.5 (ref 5.0–8.0)
PROTEIN: NEGATIVE mg/dL
SPECIFIC GRAVITY, URINE: 1.007 (ref 1.005–1.030)
Urobilinogen, UA: 0.2 mg/dL (ref 0.0–1.0)

## 2014-11-22 MED ORDER — ETODOLAC 300 MG PO CAPS: 300.0000 mg | ORAL_CAPSULE | Freq: Three times a day (TID) | ORAL | Status: DC

## 2014-11-22 MED ORDER — IOHEXOL 300 MG/ML  SOLN
100.0000 mL | Freq: Once | INTRAMUSCULAR | Status: AC | PRN
  Administered 2014-11-22: 100 mL via INTRAVENOUS

## 2014-11-22 MED ORDER — HYDROMORPHONE HCL 1 MG/ML IJ SOLN
1.0000 mg | INTRAMUSCULAR | Status: DC | PRN
  Administered 2014-11-22 (×2): 1 mg via INTRAVENOUS
  Filled 2014-11-22 (×3): qty 1

## 2014-11-22 MED ORDER — SODIUM CHLORIDE 0.9 % IV SOLN
1000.0000 mL | INTRAVENOUS | Status: DC
  Administered 2014-11-22: 1000 mL via INTRAVENOUS

## 2014-11-22 MED ORDER — ONDANSETRON HCL 4 MG/2ML IJ SOLN
4.0000 mg | Freq: Once | INTRAMUSCULAR | Status: AC
  Administered 2014-11-22: 4 mg via INTRAVENOUS
  Filled 2014-11-22: qty 2

## 2014-11-22 MED ORDER — ACETAMINOPHEN 325 MG PO TABS
650.0000 mg | ORAL_TABLET | Freq: Once | ORAL | Status: AC
  Administered 2014-11-22: 650 mg via ORAL
  Filled 2014-11-22: qty 2

## 2014-11-22 MED ORDER — IOHEXOL 300 MG/ML  SOLN
25.0000 mL | INTRAMUSCULAR | Status: AC
  Administered 2014-11-22 (×2): 25 mL via ORAL

## 2014-11-22 NOTE — ED Notes (Signed)
Pt states she has lost weight over the past month. She states she still has an appetite. Pt states she normally weighs 100lb

## 2014-11-22 NOTE — Discharge Instructions (Signed)

## 2014-11-22 NOTE — ED Provider Notes (Signed)
CSN: [id number]     Arrival date & time 11/22/14  0753 History   First MD Initiated Contact with Patient 11/22/14 [phone]     Chief Complaint  Patient presents with  . Hematuria   Patient is a 54 y.o. female presenting with back pain. The history is provided by the patient.  Back Pain Location:  Lumbar spine Quality:  Aching Pain severity:  Moderate Onset quality:  Gradual Duration:  1 day Timing:  Constant Chronicity:  New Context: not MVA, not recent illness and not recent injury   Relieved by:  Nothing Worsened by:  Bending and ambulation Associated symptoms: abdominal pain, dysuria (sometimes stinging) and headaches   Associated symptoms: no fever   Associated symptoms comment:  Hematuria  Risk factors: no recent surgery   Last night tried to have intercourse but it was painful.  She called her gynecologist and was told to come to the ED.  Past Medical History  Diagnosis Date  . MS (multiple sclerosis)   . Chronic depression   . Anorexia nervosa   . Pituitary microadenoma   . Sleep disorder   . Emphysema/COPD   . Tremor   . Osteoarthritis   . Hyperthyroidism   . Osteoporosis   . Diverticulosis   . Anxiety   . Arthritis   . Chronic headaches   . Colitis   . Fibromyalgia   . Tubular adenoma of colon    Past Surgical History  Procedure Laterality Date  . Cholecystectomy    . Oophorectomy    . Cesarean section      x 2  . Appendectomy    . Tonsillectomy    . Dental surgery    . Abdominal hysterectomy  2008   Family History  Problem Relation Age of Onset  . Depression Father   . Diabetes Father   . Colon cancer Paternal Grandmother   . Colon polyps Mother   . Colon polyps Father   . COPD Father   . Hypertension Mother   . Diabetes Maternal Grandmother   . Pancreatitis Son   . Rectal cancer Paternal Grandmother   . Irritable bowel syndrome      cousin   History  Substance Use Topics  . Smoking status: Current Every Day Smoker    Types: Cigarettes   . Smokeless tobacco: Never Used  . Alcohol Use: 0.0 oz/week    0 Standard drinks or equivalent per week     Comment: rare   OB History    No data available     Review of Systems  Constitutional: Positive for unexpected weight change (over last month). Negative for fever.  Gastrointestinal: Positive for abdominal pain. Negative for vomiting and diarrhea.  Genitourinary: Positive for dysuria (sometimes stinging) and hematuria. Negative for vaginal bleeding, vaginal discharge and vaginal pain.  Musculoskeletal: Positive for back pain.  Neurological: Positive for headaches.  All other systems reviewed and are negative.     Allergies  Paroxetine hcl; Pregabalin; and Suboxone  Home Medications   Prior to Admission medications   Medication Sig Start Date End Date Taking? Authorizing Provider  baclofen (LIORESAL) 10 MG tablet Take 10 mg by mouth 3 (three) times daily. 08/20/14  Yes Historical Provider, MD  buPROPion (WELLBUTRIN XL) 150 MG 24 hr tablet Take 450 mg by mouth daily.   Yes Historical Provider, MD  carisoprodol (SOMA) 350 MG tablet Take 350 mg by mouth 3 (three) times daily as needed for muscle spasms.    Yes Historical Provider,  MD  clonazePAM (KLONOPIN) 0.5 MG tablet Take 0.5 mg by mouth. Take one tablet AM, 1/2 tablet afternoon, 1/2 tablet PM, 2 tablets at bedtime   Yes Historical Provider, MD  conjugated estrogens (PREMARIN) vaginal cream Place 1 Applicatorful vaginally daily.   Yes Historical Provider, MD  dicyclomine (BENTYL) 20 MG tablet Take 20 mg by mouth 4 (four) times daily as needed for spasms.    Yes Historical Provider, MD  gabapentin (NEURONTIN) 300 MG capsule Take 300 mg by mouth 4 (four) times daily as needed (pain).    Yes Historical Provider, MD  HYDROcodone-acetaminophen (NORCO) 10-325 MG per tablet Take 1 tablet by mouth 2 (two) times daily.   Yes Historical Provider, MD  lubiprostone (AMITIZA) 8 MCG capsule Take 8 mcg by mouth 2 (two) times daily with a  meal.   Yes Historical Provider, MD  mesalamine (LIALDA) 1.2 G EC tablet Take 2 tablets (2.4 g total) by mouth daily with breakfast. 10/12/14  Yes Jerene Bears, MD  methylphenidate (RITALIN) 10 MG tablet Take 10 mg by mouth 3 (three) times daily.   Yes Historical Provider, MD  omeprazole (PRILOSEC) 20 MG capsule Take 20 mg by mouth daily.   Yes Historical Provider, MD  ondansetron (ZOFRAN-ODT) 8 MG disintegrating tablet Take 8 mg by mouth every 8 (eight) hours as needed for nausea.  08/15/14  Yes Historical Provider, MD  Vortioxetine HBr 10 MG TABS Take 10 mg by mouth daily.   Yes Historical Provider, MD  AMITIZA 8 MCG capsule TAKE 1 CAPSULE (8 MCG TOTAL) BY MOUTH 2 (TWO) TIMES DAILY WITH A MEAL. Patient not taking: Reported on 11/22/2014 11/15/14   Jerene Bears, MD  buPROPion (WELLBUTRIN XL) 300 MG 24 hr tablet Take 300 mg by mouth daily.    Historical Provider, MD  etodolac (LODINE) 300 MG capsule Take 1 capsule (300 mg total) by mouth every 8 (eight) hours. 11/22/14   Dorie Rank, MD  Glatiramer Acetate 40 MG/ML SOSY Inject into the skin. One injection three times a week    Historical Provider, MD   BP 116/83 mmHg  Pulse 80  Temp(Src) 97.6 F (36.4 C) (Oral)  Resp 16  Ht 5' (1.524 m)  Wt 87 lb (39.463 kg)  BMI 16.99 kg/m2  SpO2 97% Physical Exam  Constitutional: No distress.  Underweight, frail   HENT:  Head: Normocephalic and atraumatic.  Right Ear: External ear normal.  Left Ear: External ear normal.  Eyes: Conjunctivae are normal. Right eye exhibits no discharge. Left eye exhibits no discharge. No scleral icterus.  Neck: Neck supple. No tracheal deviation present.  Cardiovascular: Normal rate, regular rhythm and intact distal pulses.   Pulmonary/Chest: Effort normal and breath sounds normal. No stridor. No respiratory distress. She has no wheezes. She has no rales.  Abdominal: Soft. Bowel sounds are normal. She exhibits no distension. There is tenderness in the right lower quadrant and  suprapubic area. There is no rebound and no guarding.  Genitourinary: Cervix exhibits no discharge. Right adnexum displays no mass and no tenderness. Left adnexum displays tenderness. Left adnexum displays no mass. There is tenderness in the vagina. No erythema or bleeding in the vagina. No foreign body around the vagina. No signs of injury around the vagina. No vaginal discharge found.  Small left inguinal lymph node, shaved pubis; ttp on pelvic exam, no gros   Musculoskeletal: She exhibits no edema or tenderness.  Neurological: She is alert. She has normal strength. No cranial nerve deficit (no facial  droop, extraocular movements intact, no slurred speech) or sensory deficit. She exhibits normal muscle tone. She displays no seizure activity. Coordination normal.  Skin: Skin is warm and dry. No rash noted.  Psychiatric: She has a normal mood and affect.  Nursing note and vitals reviewed.   ED Course  Procedures (including critical care time) Labs Review Labs Reviewed  URINE CULTURE  URINALYSIS, ROUTINE W REFLEX MICROSCOPIC  CBC WITH DIFFERENTIAL/PLATELET  COMPREHENSIVE METABOLIC PANEL  LIPASE, BLOOD  RPR  HIV ANTIBODY (ROUTINE TESTING)  GC/CHLAMYDIA PROBE AMP ([place])    Imaging Review Ct Abdomen Pelvis W Contrast  11/22/2014   CLINICAL DATA:  54 year old female with a history of blood in urine.  EXAM: CT ABDOMEN AND PELVIS WITH CONTRAST  TECHNIQUE: Multidetector CT imaging of the abdomen and pelvis was performed using the standard protocol following bolus administration of intravenous contrast.  CONTRAST:  119mL OMNIPAQUE IOHEXOL 300 MG/ML  SOLN  COMPARISON:  05/17/2014, 05/12/2014  FINDINGS: Lower chest:  Unremarkable appearance of the superficial soft tissues of the chest wall.  Heart size unremarkable.  No pericardial fluid/ thickening.  Respiratory motion limits evaluation of the lungs for small nodules. Atelectasis/ scarring at the bilateral lung bases. No pleural effusion or  pneumothorax.  Abdomen/pelvis:  Unremarkable appearance of liver.  Unremarkable spleen.  Surgical changes of cholecystectomy. Mild dilation of the extrahepatic and intrahepatic biliary system, unchanged from comparison CT. Greatest diameter of the common bile duct measures 10 mm, unchanged.  Unremarkable appearance of the bilateral adrenal glands.  Unremarkable visualized pancreas.  Unremarkable appearance the bilateral kidneys. Unremarkable course of the bilateral ureters. Unremarkable appearance of the urinary bladder.  No significant intraperitoneal fluid.  No free air.  No mesenteric adenopathy.  No retroperitoneal adenopathy.  Enteric contrast extends to the distal small bowel. No abnormally distended small bowel or colon. Large stool burden. Colonic diverticular disease again noted. There appears to be resolution of the previously identified colonic wall thickening of the sigmoid colon. No inflammatory changes of the mesenteric air present on the current study.  The appendix is not visualized, however, there are no inflammatory changes in the region of the cecum.  Scattered calcifications of the abdominal aorta. No dissection flap or aneurysm. No periaortic fluid.  Surgical changes of hysterectomy.  No displaced fracture.  Minimal degenerative changes of the spine.  Bilateral lymph nodes of the inguinal region, not enlarged by CT size criteria.  Coarse calcifications of the subcutaneous fat of the gluteal region, potentially injection granulomas.  IMPRESSION: No acute finding of the abdomen/ pelvis.  Large stool burden, potentially representing constipation.  Unremarkable appearance of the kidneys in urinary bladder. If there is concern for urinary tract infection, recommend correlation with urinalysis.  Signed,  Dulcy Fanny. Earleen Newport, DO  [facility]  [facility]   Electronically Signed   By: Corrie Mckusick D.O.   On: 11/22/2014 12:42    Medications  HYDROmorphone  (DILAUDID) injection 1 mg (1 mg Intravenous Given 11/22/14 0950)  0.9 %  sodium chloride infusion (0 mLs Intravenous Stopped 11/22/14 0951)  ondansetron (ZOFRAN) injection 4 mg (4 mg Intravenous Given 11/22/14 0853)  acetaminophen (TYLENOL) tablet 650 mg (650 mg Oral Given 11/22/14 0950)  iohexol (OMNIPAQUE) 300 MG/ML solution 25 mL (25 mLs Oral Contrast Given 11/22/14 1123)  iohexol (OMNIPAQUE) 300 MG/ML solution 100 mL (100 mLs Intravenous Contrast Given 11/22/14 1209)     MDM   Final diagnoses:  Abdominal pain    The patient complains  of lower abdominal pain and painful intercourse.  She has had a hysterectomy.  NO abnormal findings noted on pelvic exam.    CT scan does not show any acute process to account for her symptoms.  Constipation is noted however pt states that his not a new issue for her.  No uti or hematuria.  Urine cult sent.  Recommend follow up with her gynecologist.  At this time there does not appear to be any evidence of an acute emergency medical condition and the patient appears stable for discharge with appropriate outpatient follow up.     Dorie Rank, MD 11/22/14 1341

## 2014-11-22 NOTE — ED Notes (Signed)
Pt states she started having a lot of blood in her urine since Friday. Pt states she is having discomfort in her lower abd and a headache. Pt states it has been very painful for her during intercourse.

## 2014-11-22 NOTE — ED Notes (Addendum)
Pt has swollen lymph node in left groin. Pt notified RN of this. Noticeable round nodule in left groin. None noted in right groin.

## 2014-11-22 NOTE — ED Notes (Signed)
Pt placed in gown and in bed. Pt monitored by pulse ox, bp cuff, and 5-lead. 

## 2014-11-23 LAB — URINE CULTURE
Colony Count: NO GROWTH
Culture: NO GROWTH
Special Requests: NORMAL

## 2014-11-23 LAB — HIV ANTIBODY (ROUTINE TESTING W REFLEX): HIV SCREEN 4TH GENERATION: NONREACTIVE

## 2014-11-23 LAB — GC/CHLAMYDIA PROBE AMP (~~LOC~~) NOT AT ARMC
Chlamydia: NEGATIVE
Neisseria Gonorrhea: NEGATIVE

## 2014-11-23 LAB — RPR: RPR Ser Ql: NONREACTIVE

## 2014-11-28 ENCOUNTER — Encounter (HOSPITAL_COMMUNITY): Payer: Self-pay

## 2014-11-28 ENCOUNTER — Encounter (HOSPITAL_COMMUNITY)
Admission: RE | Admit: 2014-11-28 | Discharge: 2014-11-28 | Disposition: A | Discharge status: Home / Self Care | Payer: Medicare Other | Source: Ambulatory Visit | Attending: Obstetrics and Gynecology | Admitting: Obstetrics and Gynecology

## 2014-11-28 HISTORY — DX: Gastro-esophageal reflux disease without esophagitis: K21.9

## 2014-11-28 HISTORY — DX: Nausea with vomiting, unspecified: R11.2

## 2014-11-28 HISTORY — DX: Other specified postprocedural states: Z98.890

## 2014-11-28 HISTORY — DX: Personal history of other diseases of urinary system: Z87.448

## 2014-11-28 LAB — TYPE AND SCREEN
ABO/RH(D): O POS
Antibody Screen: NEGATIVE

## 2014-11-28 LAB — CBC
HEMATOCRIT: 39.2 % (ref 36.0–46.0)
Hemoglobin: 13.4 g/dL (ref 12.0–15.0)
MCH: 29.8 pg (ref 26.0–34.0)
MCHC: 34.2 g/dL (ref 30.0–36.0)
MCV: 87.3 fL (ref 78.0–100.0)
Platelets: 240 10*3/uL (ref 150–400)
RBC: 4.49 MIL/uL (ref 3.87–5.11)
RDW: 14.3 % (ref 11.5–15.5)
WBC: 5.2 10*3/uL (ref 4.0–10.5)

## 2014-11-28 LAB — ABO/RH: ABO/RH(D): O POS

## 2014-11-28 NOTE — Patient Instructions (Addendum)
Your procedure is scheduled on:  Wednesday, Nov 30, 2014  Enter through the [employer] [facility] at: 6:00 A.M.  Pick up the phone at the desk and dial [phone].  Call this number if you have problems the morning of surgery: [phone].  Remember: Do NOT eat food or drink after: Midnight Tuesday Take these medicines the morning of surgery with a SIP OF WATER:  Wellbutrin, Clonazepam, Gabapentin, Ritalin, Omeprazole, Brintellix  Do NOT wear jewelry (body piercing), metal hair clips/bobby pins, make-up, or nail polish. Do NOT wear lotions, powders, or perfumes.  You may wear deoderant. Do NOT shave for 48 hours prior to surgery. Do NOT bring valuables to the hospital. Contacts, dentures, or bridgework may not be worn into surgery. Leave suitcase in car.  After surgery it may be brought to your room.  For patients admitted to the hospital, checkout time is 11:00 AM the day of discharge. Have a responsible adult drive you home and stay with you for 24 hours after your procedure

## 2014-11-28 NOTE — H&P (Signed)
[facility]  DICTATION # [id number] CSN# [id number]   Margarette Asal, MD 11/28/2014 1:50 PM

## 2014-11-29 MED ORDER — DEXTROSE 5 % IV SOLN
2.0000 g | INTRAVENOUS | Status: AC
  Administered 2014-11-30: 2 g via INTRAVENOUS
  Filled 2014-11-29: qty 2

## 2014-11-29 NOTE — H&P (Signed)
Sierra Morris, Sierra Morris                ACCOUNT NO.:  [id number]  MEDICAL RECORD NO.:  [id number]  LOCATION:  PERIO                         FACILITY:  [facility]  PHYSICIAN:  Ralene Bathe. Matthew Saras, M.D.DATE OF BIRTH:  1961/04/07  DATE OF ADMISSION:  11/30/2014 DATE OF DISCHARGE:                             HISTORY & PHYSICAL   CHIEF COMPLAINT:  Acute on chronic pelvic pain.  HISTORY OF PRESENT ILLNESS:  A 54 year old postmenopausal patient G3, P3.  She had a hysterectomy abdominally in 1997.  Pathology showed cervical endometriosis.  No other abnormalities.  Both ovaries were conserved.  Recently, she has experienced more problems related to the pelvic pain and was actually seen in the ED recently.  Followup UA and CT along with urine culture normal.  She does have a history of MS.  A lot of her discomfort is made worse by palpation on pelvic exam suggesting the possibility of significant adnexal adhesions.  She presents now for laparoscopy with BSO.  This procedure including specific risks related to bleeding, infection, transfusion, adjacent organ injury, the possible need to complete the surgery by open technique along with her expected recovery time were reviewed, all of which she understands and accepts.  ALLERGIES:  LYRICA and PAXIL.  PRIOR SURGERY:  Tonsillectomy, appendectomy, cholecystectomy, sinus surgery, hysterectomy.  REVIEW OF SYSTEMS:  Significant for history of MS, osteoporosis.  CURRENT MEDICATIONS: 1. BuSpar. 2. Clonazepam. 3. Pantoprazole. 4. Suboxone. 5. Hydrocodone that she takes for pain related to her MS. 6. Viibryd 40 mg daily. 7. Methylphenidate 10 mg 3 times a day. 8. Elestrin gel 1 pump daily.  PHYSICAL EXAMINATION:  VITAL SIGNS:  Temperature 98.2, blood pressure 120/78. HEENT:  Unremarkable. NECK:  Supple without masses. LUNGS:  Clear. CARDIOVASCULAR:  Regular rate and rhythm without murmurs, rubs, or gallops noted. BREASTS:  Without masses. ABDOMEN:   Soft, flat.  She is very thin. GU:  Vulva, vagina, vaginal cuff normal.  There is tenderness on palpation.  No definite nodularity or masses appreciated however. EXTREMITIES:  Unremarkable. NEUROLOGIC:  Unremarkable.  IMPRESSION:  Acute on chronic pelvic pain, possible adnexal adhesions.  PLAN:  Laparoscopy with BSO, possible laparotomy with BSO.  Procedure and risks discussed as above.     Talyah Seder M. Matthew Saras, M.D.     [facility]/MEDQ  D:  11/28/2014  T:  11/29/2014  Job:  [id number]

## 2014-11-30 ENCOUNTER — Encounter (HOSPITAL_COMMUNITY): Payer: Self-pay | Admitting: Certified Registered Nurse Anesthetist

## 2014-11-30 ENCOUNTER — Ambulatory Visit (HOSPITAL_COMMUNITY): Payer: Medicare Other | Admitting: Certified Registered Nurse Anesthetist

## 2014-11-30 ENCOUNTER — Inpatient Hospital Stay (HOSPITAL_COMMUNITY)
Admission: RE | Admit: 2014-11-30 | Discharge: 2014-12-01 | DRG: 743 | Disposition: A | Discharge status: Home / Self Care | Payer: Medicare Other | Source: Ambulatory Visit | Attending: Obstetrics and Gynecology | Admitting: Obstetrics and Gynecology

## 2014-11-30 ENCOUNTER — Encounter (HOSPITAL_COMMUNITY)
Admission: RE | Disposition: A | Discharge status: Home / Self Care | Payer: Self-pay | Source: Ambulatory Visit | Attending: Obstetrics and Gynecology

## 2014-11-30 DIAGNOSIS — R102 Pelvic and perineal pain: Secondary | ICD-10-CM | POA: Diagnosis present

## 2014-11-30 DIAGNOSIS — Z79899 Other long term (current) drug therapy: Secondary | ICD-10-CM | POA: Diagnosis not present

## 2014-11-30 DIAGNOSIS — G35 Multiple sclerosis: Secondary | ICD-10-CM | POA: Diagnosis present

## 2014-11-30 DIAGNOSIS — N736 Female pelvic peritoneal adhesions (postinfective): Secondary | ICD-10-CM | POA: Diagnosis present

## 2014-11-30 DIAGNOSIS — G8929 Other chronic pain: Secondary | ICD-10-CM | POA: Diagnosis present

## 2014-11-30 DIAGNOSIS — M810 Age-related osteoporosis without current pathological fracture: Secondary | ICD-10-CM | POA: Diagnosis present

## 2014-11-30 HISTORY — PX: LAPAROTOMY: SHX154

## 2014-11-30 HISTORY — PX: SALPINGOOPHORECTOMY: SHX82

## 2014-11-30 HISTORY — PX: LAPAROSCOPIC BILATERAL SALPINGO OOPHERECTOMY: SHX5890

## 2014-11-30 SURGERY — SALPINGO-OOPHORECTOMY, BILATERAL, LAPAROSCOPIC
Anesthesia: General

## 2014-11-30 MED ORDER — MORPHINE SULFATE (PF) 1 MG/ML IV SOLN
INTRAVENOUS | Status: DC
  Administered 2014-11-30 (×2): via INTRAVENOUS
  Administered 2014-11-30: 20 mL via INTRAVENOUS
  Administered 2014-11-30: 3 mL via INTRAVENOUS
  Administered 2014-12-01: 4 mg via INTRAVENOUS
  Administered 2014-12-01: 6.52 mg via INTRAVENOUS
  Filled 2014-11-30 (×2): qty 25

## 2014-11-30 MED ORDER — METOCLOPRAMIDE HCL 5 MG/ML IJ SOLN: 10.0000 mg | Freq: Once | INTRAMUSCULAR | Status: DC | PRN

## 2014-11-30 MED ORDER — VORTIOXETINE HBR 10 MG PO TABS
10.0000 mg | ORAL_TABLET | Freq: Every morning | ORAL | Status: DC
  Filled 2014-11-30: qty 1

## 2014-11-30 MED ORDER — CLONAZEPAM 0.5 MG PO TABS
0.5000 mg | ORAL_TABLET | Freq: Four times a day (QID) | ORAL | Status: DC
Start: 2014-11-30 — End: 2014-11-30

## 2014-11-30 MED ORDER — DIPHENHYDRAMINE HCL 50 MG/ML IJ SOLN: 12.5000 mg | Freq: Four times a day (QID) | INTRAMUSCULAR | Status: DC | PRN

## 2014-11-30 MED ORDER — MIDAZOLAM HCL 2 MG/2ML IJ SOLN
INTRAMUSCULAR | Status: AC
  Filled 2014-11-30: qty 2

## 2014-11-30 MED ORDER — EPHEDRINE SULFATE 50 MG/ML IJ SOLN
INTRAMUSCULAR | Status: DC | PRN
  Administered 2014-11-30 (×2): 10 mg via INTRAVENOUS

## 2014-11-30 MED ORDER — FENTANYL CITRATE (PF) 250 MCG/5ML IJ SOLN
INTRAMUSCULAR | Status: AC
  Filled 2014-11-30: qty 5

## 2014-11-30 MED ORDER — LIDOCAINE HCL (PF) 1 % IJ SOLN
INTRAMUSCULAR | Status: AC
  Filled 2014-11-30: qty 5

## 2014-11-30 MED ORDER — BUPIVACAINE HCL (PF) 0.25 % IJ SOLN
INTRAMUSCULAR | Status: AC
  Filled 2014-11-30: qty 30

## 2014-11-30 MED ORDER — KETOROLAC TROMETHAMINE 30 MG/ML IJ SOLN: 30.0000 mg | Freq: Four times a day (QID) | INTRAMUSCULAR | Status: DC

## 2014-11-30 MED ORDER — BUPIVACAINE LIPOSOME 1.3 % IJ SUSP
Freq: Once | INTRAMUSCULAR | Status: DC
  Filled 2014-11-30: qty 20

## 2014-11-30 MED ORDER — OXYCODONE-ACETAMINOPHEN 5-325 MG PO TABS
1.0000 | ORAL_TABLET | ORAL | Status: DC | PRN
Start: 2014-11-30 — End: 2014-12-01
  Administered 2014-12-01 (×2): 2 via ORAL
  Filled 2014-11-30 (×2): qty 2

## 2014-11-30 MED ORDER — BUPIVACAINE LIPOSOME 1.3 % IJ SUSP
INTRAMUSCULAR | Status: DC | PRN
  Administered 2014-11-30: 20 mL
  Administered 2014-11-30: 10 mL

## 2014-11-30 MED ORDER — DEXTROSE IN LACTATED RINGERS 5 % IV SOLN
INTRAVENOUS | Status: DC
Start: 2014-11-30 — End: 2014-12-01
  Administered 2014-11-30 – 2014-12-01 (×3): via INTRAVENOUS

## 2014-11-30 MED ORDER — DEXAMETHASONE SODIUM PHOSPHATE 10 MG/ML IJ SOLN
INTRAMUSCULAR | Status: DC | PRN
  Administered 2014-11-30: 4 mg via INTRAVENOUS

## 2014-11-30 MED ORDER — CLONAZEPAM 0.5 MG PO TABS
1.0000 mg | ORAL_TABLET | Freq: Every day | ORAL | Status: DC
  Administered 2014-11-30: 1 mg via ORAL
  Filled 2014-11-30: qty 2

## 2014-11-30 MED ORDER — LUBIPROSTONE 8 MCG PO CAPS
8.0000 ug | ORAL_CAPSULE | Freq: Two times a day (BID) | ORAL | Status: DC
  Filled 2014-11-30: qty 1

## 2014-11-30 MED ORDER — SCOPOLAMINE 1 MG/3DAYS TD PT72
1.0000 | MEDICATED_PATCH | Freq: Once | TRANSDERMAL | Status: DC
  Administered 2014-11-30: 1.5 mg via TRANSDERMAL

## 2014-11-30 MED ORDER — PHENYLEPHRINE 40 MCG/ML (10ML) SYRINGE FOR IV PUSH (FOR BLOOD PRESSURE SUPPORT)
PREFILLED_SYRINGE | INTRAVENOUS | Status: AC
  Filled 2014-11-30: qty 10

## 2014-11-30 MED ORDER — BUPROPION HCL ER (XL) 300 MG PO TB24
450.0000 mg | ORAL_TABLET | Freq: Every day | ORAL | Status: DC
  Administered 2014-12-01: 450 mg via ORAL
  Filled 2014-11-30 (×2): qty 1

## 2014-11-30 MED ORDER — MESALAMINE 1.2 G PO TBEC
2.4000 g | DELAYED_RELEASE_TABLET | Freq: Every day | ORAL | Status: DC
  Administered 2014-12-01: 2.4 g via ORAL
  Filled 2014-11-30 (×3): qty 2

## 2014-11-30 MED ORDER — CLONAZEPAM 0.5 MG PO TABS
0.2500 mg | ORAL_TABLET | Freq: Two times a day (BID) | ORAL | Status: DC
  Administered 2014-11-30: 0.25 mg via ORAL

## 2014-11-30 MED ORDER — LUBIPROSTONE 8 MCG PO CAPS
8.0000 ug | ORAL_CAPSULE | Freq: Two times a day (BID) | ORAL | Status: DC
  Administered 2014-12-01: 8 ug via ORAL
  Filled 2014-11-30 (×4): qty 1

## 2014-11-30 MED ORDER — PROPOFOL 10 MG/ML IV BOLUS
INTRAVENOUS | Status: AC
  Filled 2014-11-30: qty 20

## 2014-11-30 MED ORDER — NEOSTIGMINE METHYLSULFATE 10 MG/10ML IV SOLN
INTRAVENOUS | Status: AC
  Filled 2014-11-30: qty 1

## 2014-11-30 MED ORDER — GABAPENTIN 300 MG PO CAPS
300.0000 mg | ORAL_CAPSULE | Freq: Four times a day (QID) | ORAL | Status: DC | PRN
  Filled 2014-11-30: qty 1

## 2014-11-30 MED ORDER — LACTATED RINGERS IV SOLN
INTRAVENOUS | Status: DC
  Administered 2014-11-30 (×2): via INTRAVENOUS

## 2014-11-30 MED ORDER — SODIUM CHLORIDE 0.9 % IJ SOLN
INTRAMUSCULAR | Status: AC
  Filled 2014-11-30: qty 20

## 2014-11-30 MED ORDER — KETOROLAC TROMETHAMINE 30 MG/ML IJ SOLN
INTRAMUSCULAR | Status: DC | PRN
  Administered 2014-11-30: 30 mg via INTRAVENOUS

## 2014-11-30 MED ORDER — MIDAZOLAM HCL 2 MG/2ML IJ SOLN
INTRAMUSCULAR | Status: DC | PRN
  Administered 2014-11-30: 2 mg via INTRAVENOUS

## 2014-11-30 MED ORDER — FENTANYL CITRATE (PF) 100 MCG/2ML IJ SOLN
INTRAMUSCULAR | Status: AC
  Administered 2014-11-30: 50 ug via INTRAVENOUS
  Filled 2014-11-30: qty 2

## 2014-11-30 MED ORDER — VORTIOXETINE HBR 10 MG PO TABS: 1.0000 | ORAL_TABLET | Freq: Every day | ORAL | Status: DC

## 2014-11-30 MED ORDER — GLYCOPYRROLATE 0.2 MG/ML IJ SOLN
INTRAMUSCULAR | Status: DC | PRN
  Administered 2014-11-30: 0.4 mg via INTRAVENOUS

## 2014-11-30 MED ORDER — CLONAZEPAM 0.5 MG PO TABS
0.5000 mg | ORAL_TABLET | Freq: Every day | ORAL | Status: DC
  Administered 2014-12-01: 0.5 mg via ORAL
  Filled 2014-11-30: qty 1

## 2014-11-30 MED ORDER — PROPOFOL 10 MG/ML IV BOLUS
INTRAVENOUS | Status: DC | PRN
  Administered 2014-11-30: 200 mg via INTRAVENOUS

## 2014-11-30 MED ORDER — ONDANSETRON HCL 4 MG PO TABS
4.0000 mg | ORAL_TABLET | Freq: Four times a day (QID) | ORAL | Status: DC | PRN
  Administered 2014-12-01: 4 mg via ORAL
  Filled 2014-11-30: qty 1

## 2014-11-30 MED ORDER — FENTANYL CITRATE (PF) 100 MCG/2ML IJ SOLN
25.0000 ug | INTRAMUSCULAR | Status: DC | PRN
  Administered 2014-11-30 (×2): 50 ug via INTRAVENOUS

## 2014-11-30 MED ORDER — FENTANYL CITRATE (PF) 100 MCG/2ML IJ SOLN
INTRAMUSCULAR | Status: DC | PRN
  Administered 2014-11-30 (×4): 50 ug via INTRAVENOUS

## 2014-11-30 MED ORDER — PANTOPRAZOLE SODIUM 40 MG PO TBEC
40.0000 mg | DELAYED_RELEASE_TABLET | Freq: Every day | ORAL | Status: DC
  Administered 2014-12-01: 40 mg via ORAL
  Filled 2014-11-30: qty 1

## 2014-11-30 MED ORDER — DICYCLOMINE HCL 20 MG PO TABS
20.0000 mg | ORAL_TABLET | Freq: Four times a day (QID) | ORAL | Status: DC | PRN
  Filled 2014-11-30: qty 1

## 2014-11-30 MED ORDER — IBUPROFEN 800 MG PO TABS
800.0000 mg | ORAL_TABLET | Freq: Three times a day (TID) | ORAL | Status: DC | PRN
  Administered 2014-12-01: 800 mg via ORAL
  Filled 2014-11-30: qty 1

## 2014-11-30 MED ORDER — KETOROLAC TROMETHAMINE 30 MG/ML IJ SOLN
INTRAMUSCULAR | Status: AC
  Filled 2014-11-30: qty 1

## 2014-11-30 MED ORDER — ONDANSETRON HCL 4 MG/2ML IJ SOLN: 4.0000 mg | Freq: Four times a day (QID) | INTRAMUSCULAR | Status: DC | PRN

## 2014-11-30 MED ORDER — ONDANSETRON HCL 4 MG/2ML IJ SOLN
INTRAMUSCULAR | Status: DC | PRN
  Administered 2014-11-30: 4 mg via INTRAVENOUS

## 2014-11-30 MED ORDER — EPHEDRINE 5 MG/ML INJ
INTRAVENOUS | Status: AC
  Filled 2014-11-30: qty 10

## 2014-11-30 MED ORDER — SODIUM CHLORIDE 0.9 % IJ SOLN: 9.0000 mL | INTRAMUSCULAR | Status: DC | PRN

## 2014-11-30 MED ORDER — LUBIPROSTONE 8 MCG PO CAPS: 8.0000 ug | ORAL_CAPSULE | Freq: Two times a day (BID) | ORAL | Status: DC

## 2014-11-30 MED ORDER — BUPIVACAINE LIPOSOME 1.3 % IJ SUSP
20.0000 mL | Freq: Once | INTRAMUSCULAR | Status: DC
Start: 2014-11-30 — End: 2014-11-30
  Filled 2014-11-30: qty 20

## 2014-11-30 MED ORDER — DEXAMETHASONE SODIUM PHOSPHATE 4 MG/ML IJ SOLN
INTRAMUSCULAR | Status: AC
Start: 2014-11-30 — End: 2014-11-30
  Filled 2014-11-30: qty 1

## 2014-11-30 MED ORDER — SCOPOLAMINE 1 MG/3DAYS TD PT72
MEDICATED_PATCH | TRANSDERMAL | Status: AC
  Filled 2014-11-30: qty 1

## 2014-11-30 MED ORDER — NEOSTIGMINE METHYLSULFATE 10 MG/10ML IV SOLN
INTRAVENOUS | Status: DC | PRN
  Administered 2014-11-30: 3 mg via INTRAVENOUS

## 2014-11-30 MED ORDER — BUPIVACAINE HCL (PF) 0.25 % IJ SOLN
INTRAMUSCULAR | Status: DC | PRN
  Administered 2014-11-30: 10 mL

## 2014-11-30 MED ORDER — MENTHOL 3 MG MT LOZG
1.0000 | LOZENGE | OROMUCOSAL | Status: DC | PRN
  Administered 2014-12-01: 3 mg via ORAL
  Filled 2014-11-30: qty 9

## 2014-11-30 MED ORDER — DIPHENHYDRAMINE HCL 12.5 MG/5ML PO ELIX: 12.5000 mg | ORAL_SOLUTION | Freq: Four times a day (QID) | ORAL | Status: DC | PRN

## 2014-11-30 MED ORDER — ROCURONIUM BROMIDE 100 MG/10ML IV SOLN
INTRAVENOUS | Status: DC | PRN
  Administered 2014-11-30: 5 mg via INTRAVENOUS
  Administered 2014-11-30: 35 mg via INTRAVENOUS

## 2014-11-30 MED ORDER — VORTIOXETINE HBR 10 MG PO TABS
10.0000 mg | ORAL_TABLET | Freq: Every morning | ORAL | Status: DC
  Administered 2014-12-01: 10 mg via ORAL
  Filled 2014-11-30 (×3): qty 1

## 2014-11-30 MED ORDER — GLYCOPYRROLATE 0.2 MG/ML IJ SOLN
INTRAMUSCULAR | Status: AC
  Filled 2014-11-30: qty 3

## 2014-11-30 MED ORDER — ONDANSETRON HCL 4 MG/2ML IJ SOLN
INTRAMUSCULAR | Status: AC
  Filled 2014-11-30: qty 2

## 2014-11-30 MED ORDER — KETOROLAC TROMETHAMINE 30 MG/ML IJ SOLN
30.0000 mg | Freq: Four times a day (QID) | INTRAMUSCULAR | Status: DC
  Administered 2014-11-30 – 2014-12-01 (×3): 30 mg via INTRAVENOUS
  Filled 2014-11-30 (×3): qty 1

## 2014-11-30 MED ORDER — METHYLPHENIDATE HCL 5 MG PO TABS
10.0000 mg | ORAL_TABLET | Freq: Three times a day (TID) | ORAL | Status: DC
  Administered 2014-11-30: 10 mg via ORAL
  Filled 2014-11-30: qty 2

## 2014-11-30 MED ORDER — KETOROLAC TROMETHAMINE 30 MG/ML IJ SOLN: 30.0000 mg | Freq: Once | INTRAMUSCULAR | Status: DC

## 2014-11-30 MED ORDER — NALOXONE HCL 0.4 MG/ML IJ SOLN: 0.4000 mg | INTRAMUSCULAR | Status: DC | PRN

## 2014-11-30 MED ORDER — LIDOCAINE HCL (CARDIAC) 20 MG/ML IV SOLN
INTRAVENOUS | Status: DC | PRN
  Administered 2014-11-30: 30 mg via INTRAVENOUS

## 2014-11-30 MED ORDER — BUTORPHANOL TARTRATE 1 MG/ML IJ SOLN: 1.0000 mg | INTRAMUSCULAR | Status: DC | PRN

## 2014-11-30 SURGICAL SUPPLY — 52 items
BAG SPEC RTRVL LRG 6X4 10 (ENDOMECHANICALS)
BLADE SURG 10 STRL SS (BLADE) ×6 IMPLANT
CABLE HIGH FREQUENCY MONO STRZ (ELECTRODE) IMPLANT
CANISTER SUCT 3000ML (MISCELLANEOUS) ×4 IMPLANT
CATH ROBINSON RED A/P 16FR (CATHETERS) ×2 IMPLANT
CLOSURE WOUND 1/4X4 (GAUZE/BANDAGES/DRESSINGS) ×1
CLOTH BEACON ORANGE TIMEOUT ST (SAFETY) ×4 IMPLANT
CONTAINER PREFILL 10% NBF 60ML (FORM) IMPLANT
DECANTER SPIKE VIAL GLASS SM (MISCELLANEOUS) ×2 IMPLANT
DRAPE WARM FLUID 44X44 (DRAPE) IMPLANT
DRSG COVADERM PLUS 2X2 (GAUZE/BANDAGES/DRESSINGS) ×4 IMPLANT
DRSG OPSITE POSTOP 3X4 (GAUZE/BANDAGES/DRESSINGS) IMPLANT
DRSG OPSITE POSTOP 4X10 (GAUZE/BANDAGES/DRESSINGS) ×2 IMPLANT
DRSG TELFA 3X8 NADH (GAUZE/BANDAGES/DRESSINGS) IMPLANT
GLOVE BIO SURGEON STRL SZ7 (GLOVE) ×6 IMPLANT
GOWN STRL REUS W/TWL LRG LVL3 (GOWN DISPOSABLE) ×12 IMPLANT
LIQUID BAND (GAUZE/BANDAGES/DRESSINGS) ×4 IMPLANT
NEEDLE HYPO 22GX1.5 SAFETY (NEEDLE) IMPLANT
NEEDLE INSUFFLATION 120MM (ENDOMECHANICALS) ×4 IMPLANT
NS IRRIG 1000ML POUR BTL (IV SOLUTION) ×4 IMPLANT
PACK ABDOMINAL GYN (CUSTOM PROCEDURE TRAY) ×2 IMPLANT
PACK LAPAROSCOPY BASIN (CUSTOM PROCEDURE TRAY) ×4 IMPLANT
PAD DRESSING TELFA 3X8 NADH (GAUZE/BANDAGES/DRESSINGS) ×2 IMPLANT
PAD OB MATERNITY 4.3X12.25 (PERSONAL CARE ITEMS) ×4 IMPLANT
POUCH SPECIMEN RETRIEVAL 10MM (ENDOMECHANICALS) IMPLANT
PROTECTOR NERVE ULNAR (MISCELLANEOUS) ×2 IMPLANT
RTRCTR WOUND ALEXIS 18CM SML (INSTRUMENTS) ×4
SAVER CELL AAL HAEMONETICS (INSTRUMENTS) IMPLANT
SEALER TISSUE G2 CVD JAW 35 (ENDOMECHANICALS) IMPLANT
SEALER TISSUE G2 CVD JAW 45CM (ENDOMECHANICALS) IMPLANT
SET IRRIG TUBING LAPAROSCOPIC (IRRIGATION / IRRIGATOR) IMPLANT
SPONGE GAUZE 4X4 12PLY STER LF (GAUZE/BANDAGES/DRESSINGS) ×4 IMPLANT
SPONGE LAP 18X18 X RAY DECT (DISPOSABLE) ×6 IMPLANT
STRIP CLOSURE SKIN 1/4X4 (GAUZE/BANDAGES/DRESSINGS) ×3 IMPLANT
SUT CHROMIC 0 CT 1 (SUTURE) ×4 IMPLANT
SUT MON AB 2-0 CT1 36 (SUTURE) ×6 IMPLANT
SUT MON AB 4-0 PS1 27 (SUTURE) ×4 IMPLANT
SUT PDS AB 0 CT1 27 (SUTURE) ×6 IMPLANT
SUT VIC AB 0 CT1 18XCR BRD8 (SUTURE) ×2 IMPLANT
SUT VIC AB 0 CT1 8-18 (SUTURE) ×4
SUT VIC AB 2-0 CT1 18 (SUTURE) ×6 IMPLANT
SUT VIC AB 3-0 CT1 27 (SUTURE) ×4
SUT VIC AB 3-0 CT1 TAPERPNT 27 (SUTURE) ×2 IMPLANT
SUT VICRYL 0 TIES 12 18 (SUTURE) ×2 IMPLANT
SUT VICRYL RAPIDE 4/0 PS 2 (SUTURE) ×4 IMPLANT
SYR CONTROL 10ML LL (SYRINGE) IMPLANT
TOWEL OR 17X24 6PK STRL BLUE (TOWEL DISPOSABLE) ×8 IMPLANT
TRAY FOLEY CATH SILVER 14FR (SET/KITS/TRAYS/PACK) ×4 IMPLANT
TROCAR OPTI TIP 5M 100M (ENDOMECHANICALS) ×8 IMPLANT
TROCAR XCEL DIL TIP R 11M (ENDOMECHANICALS) ×4 IMPLANT
WARMER LAPAROSCOPE (MISCELLANEOUS) ×4 IMPLANT
WATER STERILE IRR 1000ML POUR (IV SOLUTION) ×4 IMPLANT

## 2014-11-30 NOTE — Progress Notes (Signed)
The patient was re-examined with no change in status 

## 2014-11-30 NOTE — Anesthesia Postprocedure Evaluation (Signed)
  Anesthesia Post-op Note  Patient: Sierra Morris  Procedure(s) Performed: Procedure(s) with comments: LAPAROSCOPIC BILATERAL SALPINGO OOPHORECTOMY (Bilateral) - converted to open POSSIBLE EXPLORATORY LAPAROTOMY,  (N/A) BILATERAL SALPINGO OOPHORECTOMY (Bilateral)  Patient Location: PACU and Women's Unit  Anesthesia Type:General  Level of Consciousness: awake, alert  and oriented  Airway and Oxygen Therapy: Patient Spontanous Breathing and Patient connected to nasal cannula oxygen  Post-op Pain: none  Post-op Assessment: Post-op Vital signs reviewed and Patient's Cardiovascular Status Stable  Post-op Vital Signs: Reviewed  Last Vitals:  Filed Vitals:   11/30/14 1444  BP:   Pulse:   Temp:   Resp: 10    Complications: No apparent anesthesia complications

## 2014-11-30 NOTE — Transfer of Care (Signed)
Immediate Anesthesia Transfer of Care Note  Patient: Sierra Morris  Procedure(s) Performed: Procedure(s) with comments: LAPAROSCOPIC BILATERAL SALPINGO OOPHORECTOMY (Bilateral) - converted to open POSSIBLE EXPLORATORY LAPAROTOMY,  (N/A) BILATERAL SALPINGO OOPHORECTOMY (Bilateral)  Patient Location: PACU  Anesthesia Type:General  Level of Consciousness: awake, oriented and sedated  Airway & Oxygen Therapy: Patient Spontanous Breathing and Patient connected to nasal cannula oxygen  Post-op Assessment: Report given to RN and Post -op Vital signs reviewed and stable  Post vital signs: Reviewed and stable  Last Vitals:  Filed Vitals:   11/30/14 0619  BP: 91/69  Pulse: 87  Temp: 79.4 C    Complications: No apparent anesthesia complications

## 2014-11-30 NOTE — Addendum Note (Signed)
Addendum  created 11/30/14 1621 by Hewitt Blade, CRNA   Modules edited: Notes Section   Notes Section:  File: [id number]

## 2014-11-30 NOTE — Op Note (Signed)
Preoperative diagnosis: Chronic pelvic pain  Postoperative diagnosis: Same, dense bilateral adnexal adhesions  Procedure: Diagnostic laparoscopy, followed by laparotomy with lysis of adhesions, BSO  Surgeon: Matthew Saras  Anesthesia: Gen.  EBL: 100 cc  Specimens removed: Bilateral tubes and ovaries, to pathology  Drains: Foley  Procedure and findings:  Patient taken the operating room after an adequate level of general anesthesia was obtained with the patient's legs in stirrups the abdomen perineum and vagina prepped and draped, the bladder was drained with a Foley catheter. Appropriate timeouts were taken at that point. The subumbilical area was infiltrated with quarter percent Marcaine plain small incision was made, the varies needle was introduced without difficulty. The intra-abdominal position was verified by pressure and water testing. After 2-1/2 L pneumoperitoneum syncopated, lap scopic trocar and sleeve were then introduced that difficulty. There was no evidence of any bleeding or trauma. 3 finger breaths above the symphysis in the midline, a 5 mm trocar was inserted under direct visualization. The left tube and ovary were surrounded by adhesions from the colon could not be readily identified and there were moderate adhesions of the right tube and ovary to the right pelvic sidewall. Decision made proceed laparotomy. Scope was removed instruments removed gas allowed to escape the subumbilical incision closed with 4-0 subcuticular closure. Patient's legs were placed flat Pfannenstiel incision made tibial scar carried down to the fascia which was incised and extended transversely she was very thinned. Rectus muscle divided in the midline, peritoneum entered sparely without incident and extended vertically. A small Alexis retractor was positioned, bowel Speck superiorly out of the field, patient placed in Trendelenburg. The right tube and ovary were grasped with Babcock clamp, surrounding filmy  adhesions were lysed. The course of the ureter could be easily seen well below. The right IP ligament was clamped divided and suture ligated with 0 Vicryls pop-off. This was completed in sequential manner removing the right tube and ovary. This is hemostatic on the left lysis of adhesions was carried out freeing up the pericecal adhesions until the left ovary could be identified. This was then grasped with a Babcock clamp surrounding adhesions were lysed until this was freed up the course of the left pelvic ureter was well below the left IP ligament was clamped divided and suture ligated in sequential manner removing the left tube and ovary these areas were inspected and noted be hemostatic. Prior to closure sponge, needle, S precast reported as correct 2. Peritoneum closed with a running 20 Vicryls suture. 20 Vicryls interrupted sutures used to reapproximate the rectus muscles in the midline. 0 Vicryls from laterally to midline used to close the fascia. This was undermined to reduce tension at the subcutaneous level. Diluted X Pearl solution was then injected into the incisional site and subcutaneous tissue for postoperative analgesia.  4-0 Monocryl skin closure with pressure dressing she tolerated this well went to recovery room in good condition.  Dictated with WewokaD.

## 2014-11-30 NOTE — Anesthesia Preprocedure Evaluation (Addendum)
Anesthesia Evaluation  Patient identified by MRN, date of birth, ID band Patient awake    Reviewed: Allergy & Precautions, NPO status , Patient's Chart, lab work & pertinent test results  History of Anesthesia Complications (+) history of anesthetic complications  Airway Mallampati: II  TM Distance: >3 FB Neck ROM: Full    Dental  (+) Partial Upper   Pulmonary COPDCurrent Smoker,  breath sounds clear to auscultation  Pulmonary exam normal       Cardiovascular negative cardio ROS Normal cardiovascular examRhythm:Regular Rate:Normal     Neuro/Psych  Headaches, PSYCHIATRIC DISORDERS Anxiety Depression  Neuromuscular disease    GI/Hepatic Neg liver ROS, GERD-  Medicated and Controlled,  Endo/Other  Hyperthyroidism   Renal/GU negative Renal ROS  negative genitourinary   Musculoskeletal  (+) Arthritis -, Fibromyalgia -  Abdominal   Peds  Hematology   Anesthesia Other Findings   Reproductive/Obstetrics Pelvic pain                            Anesthesia Physical Anesthesia Plan  ASA: II  Anesthesia Plan: General   Post-op Pain Management:    Induction: Intravenous  Airway Management Planned: Oral ETT  Additional Equipment:   Intra-op Plan:   Post-operative Plan: Extubation in OR  Informed Consent: I have reviewed the patients History and Physical, chart, labs and discussed the procedure including the risks, benefits and alternatives for the proposed anesthesia with the patient or authorized representative who has indicated his/her understanding and acceptance.   Dental advisory given  Plan Discussed with: CRNA, Anesthesiologist and Surgeon  Anesthesia Plan Comments:        Anesthesia Quick Evaluation

## 2014-11-30 NOTE — Anesthesia Postprocedure Evaluation (Signed)
  Anesthesia Post-op Note  Patient: Sierra Morris  Procedure(s) Performed: Procedure(s) with comments: LAPAROSCOPIC BILATERAL SALPINGO OOPHORECTOMY (Bilateral) - converted to open POSSIBLE EXPLORATORY LAPAROTOMY,  (N/A) BILATERAL SALPINGO OOPHORECTOMY (Bilateral)  Patient Location: PACU  Anesthesia Type:General  Level of Consciousness: awake, alert  and oriented  Airway and Oxygen Therapy: Patient Spontanous Breathing  Post-op Pain: mild  Post-op Assessment: Post-op Vital signs reviewed, Patient's Cardiovascular Status Stable, Respiratory Function Stable, Patent Airway, No signs of Nausea or vomiting and Pain level controlled  Post-op Vital Signs: Reviewed and stable  Last Vitals:  Filed Vitals:   11/30/14 0945  BP:   Pulse: 89  Temp:   Resp: 24    Complications: No apparent anesthesia complications

## 2014-11-30 NOTE — Anesthesia Procedure Notes (Signed)
Procedure Name: Intubation Date/Time: 11/30/2014 7:31 AM Performed by: Bufford Spikes Pre-anesthesia Checklist: Patient identified, Emergency Drugs available, Suction available, Patient being monitored and Timeout performed Patient Re-evaluated:Patient Re-evaluated prior to inductionOxygen Delivery Method: Circle system utilized Preoxygenation: Pre-oxygenation with 100% oxygen Intubation Type: IV induction Ventilation: Mask ventilation with difficulty Laryngoscope Size: Miller and 2 Grade View: Grade I Tube type: Oral Tube size: 7.0 mm Number of attempts: 1 Airway Equipment and Method: Stylet Placement Confirmation: ETT inserted through vocal cords under direct vision,  positive ETCO2 and breath sounds checked- equal and bilateral Secured at: 20 cm Tube secured with: Tape Dental Injury: Teeth and Oropharynx as per pre-operative assessment

## 2014-12-01 ENCOUNTER — Encounter (HOSPITAL_COMMUNITY): Payer: Self-pay | Admitting: Obstetrics and Gynecology

## 2014-12-01 LAB — CBC
HCT: 31.5 % — ABNORMAL LOW (ref 36.0–46.0)
Hemoglobin: 10.2 g/dL — ABNORMAL LOW (ref 12.0–15.0)
MCH: 28.7 pg (ref 26.0–34.0)
MCHC: 32.4 g/dL (ref 30.0–36.0)
MCV: 88.7 fL (ref 78.0–100.0)
PLATELETS: 207 10*3/uL (ref 150–400)
RBC: 3.55 MIL/uL — ABNORMAL LOW (ref 3.87–5.11)
RDW: 14.7 % (ref 11.5–15.5)
WBC: 6.4 10*3/uL (ref 4.0–10.5)

## 2014-12-01 MED ORDER — METHYLPHENIDATE HCL 5 MG PO TABS
10.0000 mg | ORAL_TABLET | Freq: Two times a day (BID) | ORAL | Status: DC
  Administered 2014-12-01: 10 mg via ORAL
  Filled 2014-12-01: qty 2

## 2014-12-01 MED ORDER — IBUPROFEN 800 MG PO TABS: 800.0000 mg | ORAL_TABLET | Freq: Three times a day (TID) | ORAL | Status: DC | PRN

## 2014-12-01 NOTE — Discharge Summary (Signed)
Physician Discharge Summary  Patient ID: Sierra Morris MRN: [id number] DOB/AGE: [birth year] 54 y.o.  Admit date: 11/30/2014 Discharge date: 12/01/2014  Admission Diagnoses:pelvic pain Discharge Diagnoses: same, + adnexal adhesions Active Problems:   Pelvic pain in female   Discharged Condition: good  Hospital Course: adm for DL>EL with LOA and BSO for pelvic pain, D/C on POD 1, Inc C/D, afeb, tol PO  Consults: None  Significant Diagnostic Studies: labs:  CBC    Component Value Date/Time   WBC 6.4 12/01/2014 0525   RBC 3.55* 12/01/2014 0525   HGB 10.2* 12/01/2014 0525   HCT 31.5* 12/01/2014 0525   PLT 207 12/01/2014 0525   MCV 88.7 12/01/2014 0525   MCH 28.7 12/01/2014 0525   MCHC 32.4 12/01/2014 0525   RDW 14.7 12/01/2014 0525   LYMPHSABS 1.8 11/22/2014 0846   MONOABS 0.5 11/22/2014 0846   EOSABS 0.3 11/22/2014 0846   BASOSABS 0.0 11/22/2014 0846      Treatments: surgery: EL with BSO  Discharge Exam: Blood pressure 85/53, pulse 75, temperature 98.7 F (37.1 C), temperature source Oral, resp. rate 10, height 5' (1.524 m), weight 95 lb (43.092 kg), SpO2 99 %. General appearance: alert GI: soft, non-tender; bowel sounds normal; no masses,  no organomegaly, Inc C/D  Disposition: 01-Home or Self Care     Medication List    STOP taking these medications        conjugated estrogens vaginal cream  Commonly known as:  PREMARIN     dicyclomine 20 MG tablet  Commonly known as:  BENTYL     Vortioxetine HBr 10 MG Tabs      TAKE these medications        AMITIZA 8 MCG capsule  Generic drug:  lubiprostone  TAKE 1 CAPSULE (8 MCG TOTAL) BY MOUTH 2 (TWO) TIMES DAILY WITH A MEAL.     buPROPion 150 MG 24 hr tablet  Commonly known as:  WELLBUTRIN XL  Take 450 mg by mouth daily.     Chlorpheniramine-PSE-Ibuprofen 2-30-200 MG Tabs  Take 1 tablet by mouth daily as needed (for allergies/sinus/pain). advil allergy sinus     clonazePAM 0.5 MG tablet  Commonly known  as:  KLONOPIN  Take 0.5 mg by mouth 4 (four) times daily. Take 1 tablet in morning, 1/2 tablet afternoon, 1/2 tablet evening, 2 tablets at bedtime     etodolac 300 MG capsule  Commonly known as:  LODINE  Take 1 capsule (300 mg total) by mouth every 8 (eight) hours.     gabapentin 300 MG capsule  Commonly known as:  NEURONTIN  Take 300 mg by mouth 4 (four) times daily as needed (pain).     Glatiramer Acetate 40 MG/ML Sosy  Inject 1 each into the skin 3 (three) times a week.     HYDROcodone-acetaminophen 10-325 MG per tablet  Commonly known as:  NORCO  Take 1 tablet by mouth 2 (two) times daily.     ibuprofen 800 MG tablet  Commonly known as:  ADVIL,MOTRIN  Take 1 tablet (800 mg total) by mouth every 8 (eight) hours as needed (mild pain).     mesalamine 1.2 G EC tablet  Commonly known as:  LIALDA  Take 2 tablets (2.4 g total) by mouth daily with breakfast.     methylphenidate 10 MG tablet  Commonly known as:  RITALIN  Take 10 mg by mouth 3 (three) times daily.     omeprazole 20 MG capsule  Commonly known as:  PRILOSEC  Take 20 mg by mouth daily.     ondansetron 8 MG disintegrating tablet  Commonly known as:  ZOFRAN-ODT  Take 8 mg by mouth every 8 (eight) hours as needed for nausea.           Follow-up Information    Follow up with Margarette Asal, MD. Schedule an appointment as soon as possible for a visit in 1 week.   Specialty:  Obstetrics and Gynecology   Contact information:   [place] [address] [phone]       Signed: Margarette Asal 12/01/2014, 9:53 AM

## 2014-12-04 ENCOUNTER — Inpatient Hospital Stay (HOSPITAL_COMMUNITY)
Admission: AD | Admit: 2014-12-04 | Discharge: 2014-12-05 | Disposition: A | Discharge status: Home / Self Care | Payer: Medicare Other | Source: Ambulatory Visit | Attending: Obstetrics and Gynecology | Admitting: Obstetrics and Gynecology

## 2014-12-04 ENCOUNTER — Encounter (HOSPITAL_COMMUNITY): Payer: Self-pay | Admitting: *Deleted

## 2014-12-04 DIAGNOSIS — Z90722 Acquired absence of ovaries, bilateral: Secondary | ICD-10-CM | POA: Diagnosis not present

## 2014-12-04 DIAGNOSIS — K219 Gastro-esophageal reflux disease without esophagitis: Secondary | ICD-10-CM | POA: Diagnosis not present

## 2014-12-04 DIAGNOSIS — Z833 Family history of diabetes mellitus: Secondary | ICD-10-CM | POA: Insufficient documentation

## 2014-12-04 DIAGNOSIS — Z9079 Acquired absence of other genital organ(s): Secondary | ICD-10-CM | POA: Diagnosis not present

## 2014-12-04 DIAGNOSIS — M810 Age-related osteoporosis without current pathological fracture: Secondary | ICD-10-CM | POA: Insufficient documentation

## 2014-12-04 DIAGNOSIS — Z79899 Other long term (current) drug therapy: Secondary | ICD-10-CM | POA: Diagnosis not present

## 2014-12-04 DIAGNOSIS — Z8249 Family history of ischemic heart disease and other diseases of the circulatory system: Secondary | ICD-10-CM | POA: Diagnosis not present

## 2014-12-04 DIAGNOSIS — F17210 Nicotine dependence, cigarettes, uncomplicated: Secondary | ICD-10-CM | POA: Diagnosis not present

## 2014-12-04 DIAGNOSIS — G8918 Other acute postprocedural pain: Secondary | ICD-10-CM | POA: Diagnosis present

## 2014-12-04 DIAGNOSIS — J439 Emphysema, unspecified: Secondary | ICD-10-CM | POA: Diagnosis not present

## 2014-12-04 DIAGNOSIS — M797 Fibromyalgia: Secondary | ICD-10-CM | POA: Diagnosis not present

## 2014-12-04 DIAGNOSIS — M1990 Unspecified osteoarthritis, unspecified site: Secondary | ICD-10-CM | POA: Insufficient documentation

## 2014-12-04 DIAGNOSIS — G35 Multiple sclerosis: Secondary | ICD-10-CM | POA: Insufficient documentation

## 2014-12-04 LAB — CBC WITH DIFFERENTIAL/PLATELET
BASOS ABS: 0 10*3/uL (ref 0.0–0.1)
Basophils Relative: 0 % (ref 0–1)
EOS PCT: 4 % (ref 0–5)
Eosinophils Absolute: 0.3 10*3/uL (ref 0.0–0.7)
HCT: 37 % (ref 36.0–46.0)
Hemoglobin: 12.3 g/dL (ref 12.0–15.0)
LYMPHS ABS: 2.7 10*3/uL (ref 0.7–4.0)
LYMPHS PCT: 42 % (ref 12–46)
MCH: 29.1 pg (ref 26.0–34.0)
MCHC: 33.2 g/dL (ref 30.0–36.0)
MCV: 87.7 fL (ref 78.0–100.0)
Monocytes Absolute: 0.4 10*3/uL (ref 0.1–1.0)
Monocytes Relative: 7 % (ref 3–12)
NEUTROS ABS: 3.1 10*3/uL (ref 1.7–7.7)
Neutrophils Relative %: 47 % (ref 43–77)
Platelets: 287 10*3/uL (ref 150–400)
RBC: 4.22 MIL/uL (ref 3.87–5.11)
RDW: 14.5 % (ref 11.5–15.5)
WBC: 6.5 10*3/uL (ref 4.0–10.5)

## 2014-12-04 LAB — COMPREHENSIVE METABOLIC PANEL
ALBUMIN: 3.8 g/dL (ref 3.5–5.0)
ALT: 28 U/L (ref 14–54)
ANION GAP: 4 — AB (ref 5–15)
AST: 25 U/L (ref 15–41)
Alkaline Phosphatase: 69 U/L (ref 38–126)
BUN: 14 mg/dL (ref 6–20)
CHLORIDE: 106 mmol/L (ref 101–111)
CO2: 27 mmol/L (ref 22–32)
CREATININE: 0.79 mg/dL (ref 0.44–1.00)
Calcium: 9.1 mg/dL (ref 8.9–10.3)
GFR calc Af Amer: >60 mL/min (ref >60)
GFR calc non Af Amer: >60 mL/min (ref >60)
Glucose, Bld: 85 mg/dL (ref 65–99)
Potassium: 4 mmol/L (ref 3.5–5.1)
Sodium: 137 mmol/L (ref 135–145)
Total Bilirubin: 0.3 mg/dL (ref 0.3–1.2)
Total Protein: 6.2 g/dL — ABNORMAL LOW (ref 6.5–8.1)

## 2014-12-04 LAB — URINALYSIS, ROUTINE W REFLEX MICROSCOPIC
Bilirubin Urine: NEGATIVE
Glucose, UA: NEGATIVE mg/dL
Ketones, ur: NEGATIVE mg/dL
Leukocytes, UA: NEGATIVE
NITRITE: NEGATIVE
PH: 6 (ref 5.0–8.0)
Protein, ur: NEGATIVE mg/dL
SPECIFIC GRAVITY, URINE: 1.01 (ref 1.005–1.030)
Urobilinogen, UA: 0.2 mg/dL (ref 0.0–1.0)

## 2014-12-04 LAB — URINE MICROSCOPIC-ADD ON

## 2014-12-04 MED ORDER — KETOROLAC TROMETHAMINE 60 MG/2ML IM SOLN
60.0000 mg | Freq: Once | INTRAMUSCULAR | Status: AC
  Administered 2014-12-04: 60 mg via INTRAMUSCULAR
  Filled 2014-12-04: qty 2

## 2014-12-04 MED ORDER — OXYCODONE-ACETAMINOPHEN 5-325 MG PO TABS
2.0000 | ORAL_TABLET | Freq: Once | ORAL | Status: AC
  Administered 2014-12-04: 2 via ORAL
  Filled 2014-12-04: qty 2

## 2014-12-04 NOTE — MAU Note (Signed)
Surgery last Wednesday to remove ovaries. Abdominal pain, dysuria, nausea, and headache today.

## 2014-12-04 NOTE — MAU Provider Note (Signed)
History     CSN: [id number]  Arrival date and time: 12/04/14 2214   First Provider Initiated Contact with Patient 12/04/14 2258      Chief Complaint  Patient presents with  . Post-op Problem   HPI Comments: Sierra Morris is a 54 y.o who had surgery on 11/30/14. She had a BSO and lysis of adhesions. She states that she had hydrocodone and neurontin for pain, and was not given additional pain medications at DC. She states that yesterday and today her pain has increased. She rates her current pain 8/10. She states that the pain medication she has at home have not helped with her pain.   Wound Check She was originally treated 3 to 5 days ago. Prior ED Treatment: BSO and lysis of adhesions  The maximum temperature noted was less than 100.4 F. The temperature was taken using an oral thermometer. There has been no drainage from the wound. There is no redness present. There is no swelling present.   Past Medical History  Diagnosis Date  . MS (multiple sclerosis)   . Chronic depression   . Anorexia nervosa     patient denies  . Pituitary microadenoma   . Sleep disorder   . Tremor   . Osteoarthritis   . Osteoporosis   . Diverticulosis   . Anxiety   . Arthritis   . Chronic headaches   . Colitis   . Fibromyalgia   . Tubular adenoma of colon   . Emphysema/COPD     patient unaware  . Hyperthyroidism     borderline per patient  . History of blood in urine   . GERD (gastroesophageal reflux disease)   . PONV (postoperative nausea and vomiting)     Past Surgical History  Procedure Laterality Date  . Cholecystectomy    . Oophorectomy    . Cesarean section      x 2  . Appendectomy    . Tonsillectomy    . Dental surgery    . Abdominal hysterectomy  2008  . Colonoscopy    . Laparoscopy    . Tumor breast bone removal benign    . Laparoscopic bilateral salpingo oopherectomy Bilateral 11/30/2014    Procedure: LAPAROSCOPIC BILATERAL SALPINGO OOPHORECTOMY;  Surgeon: Molli Posey,  MD;  Location: [facility];  Service: Gynecology;  Laterality: Bilateral;  converted to open  . Laparotomy N/A 11/30/2014    Procedure: POSSIBLE EXPLORATORY LAPAROTOMY, ;  Surgeon: Molli Posey, MD;  Location: [facility];  Service: Gynecology;  Laterality: N/A;  . Salpingoophorectomy Bilateral 11/30/2014    Procedure: BILATERAL SALPINGO OOPHORECTOMY;  Surgeon: Molli Posey, MD;  Location: [facility];  Service: Gynecology;  Laterality: Bilateral;    Family History  Problem Relation Age of Onset  . Depression Father   . Diabetes Father   . Colon cancer Paternal Grandmother   . Colon polyps Mother   . Colon polyps Father   . COPD Father   . Hypertension Mother   . Diabetes Maternal Grandmother   . Pancreatitis Son   . Rectal cancer Paternal Grandmother   . Irritable bowel syndrome      cousin    History  Substance Use Topics  . Smoking status: Current Every Day Smoker -- 0.25 packs/day for .5 years    Types: Cigarettes  . Smokeless tobacco: Never Used  . Alcohol Use: 0.0 oz/week    0 Standard drinks or equivalent per week     Comment: rare    Allergies:  Allergies  Allergen Reactions  . Wheat Bran Shortness Of Breath  . Paroxetine Hcl Other (See Comments)    Crying, depressed  . Pregabalin Itching  . Suboxone [Buprenorphine Hcl-Naloxone Hcl] Other (See Comments)    Hallucinations            Prescriptions prior to admission  Medication Sig Dispense Refill Last Dose  . AMITIZA 8 MCG capsule TAKE 1 CAPSULE (8 MCG TOTAL) BY MOUTH 2 (TWO) TIMES DAILY WITH A MEAL. 60 capsule 1 11/29/2014 at Unknown time  . buPROPion (WELLBUTRIN XL) 150 MG 24 hr tablet Take 450 mg by mouth daily.   11/30/2014 at Unknown time  . Chlorpheniramine-PSE-Ibuprofen 2-30-200 MG TABS Take 1 tablet by mouth daily as needed (for allergies/sinus/pain). advil allergy sinus   Past Week at Unknown time  . clonazePAM (KLONOPIN) 0.5 MG tablet Take 0.5 mg by mouth 4 (four) times daily. Take 1 tablet in morning, 1/2  tablet afternoon, 1/2 tablet evening, 2 tablets at bedtime   11/30/2014 at Unknown time  . etodolac (LODINE) 300 MG capsule Take 1 capsule (300 mg total) by mouth every 8 (eight) hours. (Patient not taking: Reported on 11/25/2014) 21 capsule 0 Not Taking at Unknown time  . gabapentin (NEURONTIN) 300 MG capsule Take 300 mg by mouth 4 (four) times daily as needed (pain).    11/30/2014 at Unknown time  . Glatiramer Acetate 40 MG/ML SOSY Inject 1 each into the skin 3 (three) times a week.    11/29/2014 at Unknown time  . HYDROcodone-acetaminophen (NORCO) 10-325 MG per tablet Take 1 tablet by mouth 2 (two) times daily.   11/29/2014 at Unknown time  . ibuprofen (ADVIL,MOTRIN) 800 MG tablet Take 1 tablet (800 mg total) by mouth every 8 (eight) hours as needed (mild pain). 30 tablet 0   . mesalamine (LIALDA) 1.2 G EC tablet Take 2 tablets (2.4 g total) by mouth daily with breakfast. 60 tablet 5 11/29/2014 at Unknown time  . methylphenidate (RITALIN) 10 MG tablet Take 10 mg by mouth 3 (three) times daily.   11/30/2014 at Unknown time  . omeprazole (PRILOSEC) 20 MG capsule Take 20 mg by mouth daily.   11/30/2014 at Unknown time  . ondansetron (ZOFRAN-ODT) 8 MG disintegrating tablet Take 8 mg by mouth every 8 (eight) hours as needed for nausea.   5 11/29/2014 at Unknown time    Review of Systems  Constitutional: Positive for chills. Negative for fever.  Gastrointestinal: Positive for nausea and abdominal pain. Negative for vomiting, diarrhea and constipation.  Genitourinary: Positive for dysuria. Negative for urgency and frequency.   Physical Exam   Blood pressure 93/63, pulse 80, temperature 98.1 F (36.7 C), temperature source Oral, resp. rate 16, SpO2 100 %.  Physical Exam  Nursing note and vitals reviewed. Constitutional: She is oriented to person, place, and time. She appears well-developed and well-nourished. No distress.  Cardiovascular: Normal rate.   Respiratory: Effort normal.  GI: Soft.  Incision:  CDI, no erythema, no drainage. Slightly swollen on the left outer aspect of the pfannenstiel incision. Tender in that area as well.   Neurological: She is alert and oriented to person, place, and time.  Skin: Skin is warm and dry.  Psychiatric: She has a normal mood and affect.   Results for orders placed or performed during the hospital encounter of 12/04/14 (from the past 24 hour(s))  Urinalysis, Routine w reflex microscopic (not at [facility])     Status: Abnormal   Collection Time: 12/04/14 10:28 PM  Result  Value Ref Range   Color, Urine YELLOW YELLOW   APPearance CLEAR CLEAR   Specific Gravity, Urine 1.010 1.005 - 1.030   pH 6.0 5.0 - 8.0   Glucose, UA NEGATIVE NEGATIVE mg/dL   Hgb urine dipstick TRACE (A) NEGATIVE   Bilirubin Urine NEGATIVE NEGATIVE   Ketones, ur NEGATIVE NEGATIVE mg/dL   Protein, ur NEGATIVE NEGATIVE mg/dL   Urobilinogen, UA 0.2 0.0 - 1.0 mg/dL   Nitrite NEGATIVE NEGATIVE   Leukocytes, UA NEGATIVE NEGATIVE  Urine microscopic-add on     Status: Abnormal   Collection Time: 12/04/14 10:28 PM  Result Value Ref Range   Squamous Epithelial / LPF RARE RARE   WBC, UA 0-2 <3 WBC/hpf   RBC / HPF 0-2 <3 RBC/hpf   Bacteria, UA FEW (A) RARE  CBC with Differential     Status: None   Collection Time: 12/04/14 10:40 PM  Result Value Ref Range   WBC 6.5 4.0 - 10.5 K/uL   RBC 4.22 3.87 - 5.11 MIL/uL   Hemoglobin 12.3 12.0 - 15.0 g/dL   HCT 37.0 36.0 - 46.0 %   MCV 87.7 78.0 - 100.0 fL   MCH 29.1 26.0 - 34.0 pg   MCHC 33.2 30.0 - 36.0 g/dL   RDW 14.5 11.5 - 15.5 %   Platelets 287 150 - 400 K/uL   Neutrophils Relative % 47 43 - 77 %   Neutro Abs 3.1 1.7 - 7.7 K/uL   Lymphocytes Relative 42 12 - 46 %   Lymphs Abs 2.7 0.7 - 4.0 K/uL   Monocytes Relative 7 3 - 12 %   Monocytes Absolute 0.4 0.1 - 1.0 K/uL   Eosinophils Relative 4 0 - 5 %   Eosinophils Absolute 0.3 0.0 - 0.7 K/uL   Basophils Relative 0 0 - 1 %   Basophils Absolute 0.0 0.0 - 0.1 K/uL  Comprehensive metabolic  panel     Status: Abnormal   Collection Time: 12/04/14 10:40 PM  Result Value Ref Range   Sodium 137 135 - 145 mmol/L   Potassium 4.0 3.5 - 5.1 mmol/L   Chloride 106 101 - 111 mmol/L   CO2 27 22 - 32 mmol/L   Glucose, Bld 85 65 - 99 mg/dL   BUN 14 6 - 20 mg/dL   Creatinine, Ser 0.79 0.44 - 1.00 mg/dL   Calcium 9.1 8.9 - 10.3 mg/dL   Total Protein 6.2 (L) 6.5 - 8.1 g/dL   Albumin 3.8 3.5 - 5.0 g/dL   AST 25 15 - 41 U/L   ALT 28 14 - 54 U/L   Alkaline Phosphatase 69 38 - 126 U/L   Total Bilirubin 0.3 0.3 - 1.2 mg/dL   GFR calc non Af Amer >60 >60 mL/min   GFR calc Af Amer >60 >60 mL/min   Anion gap 4 (L) 5 - 15    MAU Course  Procedures  MDM 2311: D/W Dr. Matthew Saras, will give toradol IM and percocet for pain. Call back with UA and CBC results.  0024: Patient reports some improvement with toradol and percocet. Pain is now 6/10 (down from 8/10). 0028: D/W Dr. Matthew Saras, she can increase the frequency of hydrocodone to every 3 hours PRN, and take ibuprofen on a schedule  Patient states that the RX she has only allows for 2 vicodin per day. Will give a new RX today for extras for the postop time Assessment and Plan   1. Post-operative pain    DC home Comfort measures  reviewed  Increase hydrocodone to q 3 hours Ibuprofen around the clock  RX: Norco 10/325 #20 Return to MAU as needed FU with OB this week   Follow-up Information    Follow up with Margarette Asal, MD.   Specialty:  Obstetrics and Gynecology   Why:  As scheduled   Contact information:   [place] [address] [phone]        Mathis Bud 12/04/2014, 11:02 PM

## 2014-12-05 DIAGNOSIS — G8918 Other acute postprocedural pain: Secondary | ICD-10-CM

## 2014-12-05 MED ORDER — HYDROCODONE-ACETAMINOPHEN 10-325 MG PO TABS: 1.0000 | ORAL_TABLET | ORAL | Status: DC

## 2014-12-12 ENCOUNTER — Ambulatory Visit: Payer: Medicare Other | Admitting: Internal Medicine

## 2015-01-02 ENCOUNTER — Other Ambulatory Visit: Payer: Self-pay | Admitting: Obstetrics and Gynecology

## 2015-01-02 DIAGNOSIS — R928 Other abnormal and inconclusive findings on diagnostic imaging of breast: Secondary | ICD-10-CM

## 2015-01-16 ENCOUNTER — Ambulatory Visit
Admission: RE | Admit: 2015-01-16 | Discharge: 2015-01-16 | Disposition: A | Discharge status: Home / Self Care | Payer: Medicare Other | Source: Ambulatory Visit | Attending: Obstetrics and Gynecology | Admitting: Obstetrics and Gynecology

## 2015-01-16 DIAGNOSIS — R928 Other abnormal and inconclusive findings on diagnostic imaging of breast: Secondary | ICD-10-CM

## 2015-01-31 ENCOUNTER — Encounter: Payer: Self-pay | Admitting: *Deleted

## 2015-02-04 ENCOUNTER — Other Ambulatory Visit: Payer: Self-pay | Admitting: Internal Medicine

## 2015-02-07 ENCOUNTER — Encounter: Payer: Self-pay | Admitting: Internal Medicine

## 2015-02-07 ENCOUNTER — Other Ambulatory Visit: Payer: Self-pay | Admitting: Obstetrics and Gynecology

## 2015-02-07 ENCOUNTER — Ambulatory Visit (INDEPENDENT_AMBULATORY_CARE_PROVIDER_SITE_OTHER): Payer: Medicare Other | Admitting: Internal Medicine

## 2015-02-07 VITALS — BP 102/66 | HR 62 | Ht 60.0 in | Wt 94.4 lb

## 2015-02-07 DIAGNOSIS — K5730 Diverticulosis of large intestine without perforation or abscess without bleeding: Secondary | ICD-10-CM

## 2015-02-07 DIAGNOSIS — D126 Benign neoplasm of colon, unspecified: Secondary | ICD-10-CM | POA: Diagnosis not present

## 2015-02-07 DIAGNOSIS — K5900 Constipation, unspecified: Secondary | ICD-10-CM

## 2015-02-07 DIAGNOSIS — K5909 Other constipation: Secondary | ICD-10-CM

## 2015-02-07 NOTE — Progress Notes (Signed)
Subjective:    Patient ID: Sierra Morris, female    DOB: Jun 13, 1961, 54 y.o.   MRN: [id number]  HPI Sierra Morris is a 54 year old female with past medical history of diverticulosis with diverticulitis, adenomatous colon polyps, MS, chronic pain and fibromyalgia seen for follow-up. She was last seen in the office on 09/08/2014 and for colonoscopy on 10/12/2014. Colonoscopy revealed normal terminal ileum. A 3 mm tubular adenoma was removed from the ascending colon. There was diverticulosis with mild inflammatory change in the sigmoid colon. There was also very mild erythema in the distal rectum. Pathology from the sigmoid and rectum showed benign colorectal mucosa with melanosis coli. There was no active inflammation seen.  Since her colonoscopy she had bilateral oophorectomy which was completion after remote partial hysterectomy. She reports that her gynecologist told her she had adhesions to the colon and abdominal wall which were taken down. Her recovery has been somewhat prolonged and she has had steroids injections around her scar tissue which helped with neuropathic type pain.  All movements have been mostly regular. She stopped medications at the time of her surgery and never resumed lubiprostone. She also tried Lialda which did not improve symptoms. She did not restart it. She is using Colace 200 mg daily and daily probiotic. She's having some intermittent constipation and reports she'll feel like she is "plugged" will past heart stool followed by soft and even loose stool. She would like to avoid the hard stool and if so she feels like she could go more regular. When she gets constipated she feels lower cramping abdominal pain and pelvic pressure. She denies blood in her stool or melena. Appetite she reports is very good without nausea or vomiting. She has lost some additional weight since her surgery, by our scales 3 pounds. She has GYN follow-up today  Another abdominal CT scan was performed  after her surgery on 11/22/2014 for blood in urine. This study was reviewed. This showed large stool burden potentially represent an constipation. There is no inflammatory changes in the colon. There was diverticular disease noted without diverticulitis. The previously seen thickening of the sigmoid had resolved.   Review of Systems As per history of present illness, otherwise negative  Current Medications, Allergies, Past Medical History, Past Surgical History, Family History and Social History were reviewed in [employer] record.     Objective:   Physical Exam BP 102/66 mmHg  Pulse 62  Ht 5' (1.524 m)  Wt 94 lb 6.4 oz (42.82 kg)  BMI 18.44 kg/m2  SpO2 99% Constitutional: Well-developed and well-nourished. No distress. HEENT: Normocephalic and atraumatic. Oropharynx is clear and moist. No oropharyngeal exudate. Conjunctivae are normal.  No scleral icterus. Neck: Neck supple. Trachea midline. Cardiovascular: Normal rate, regular rhythm and intact distal pulses. No M/R/G Pulmonary/chest: Effort normal and breath sounds normal. No wheezing, rales or rhonchi. Abdominal: Soft, thin, nontender, nondistended. Bowel sounds active throughout. Well-healing transverse lower abdominal scar Extremities: no clubbing, cyanosis, or edema Lymphadenopathy: No cervical adenopathy noted. Neurological: Alert and oriented to person place and time. Skin: Skin is warm and dry. No rashes noted. Psychiatric: Normal mood and affect. Behavior is normal.  CBC    Component Value Date/Time   WBC 6.5 12/04/2014 2240   RBC 4.22 12/04/2014 2240   HGB 12.3 12/04/2014 2240   HCT 37.0 12/04/2014 2240   PLT 287 12/04/2014 2240   MCV 87.7 12/04/2014 2240   MCH 29.1 12/04/2014 2240   MCHC 33.2 12/04/2014 2240  RDW 14.5 12/04/2014 2240   LYMPHSABS 2.7 12/04/2014 2240   MONOABS 0.4 12/04/2014 2240   EOSABS 0.3 12/04/2014 2240   BASOSABS 0.0 12/04/2014 2240    CMP     Component Value  Date/Time   NA 137 12/04/2014 2240   K 4.0 12/04/2014 2240   CL 106 12/04/2014 2240   CO2 27 12/04/2014 2240   GLUCOSE 85 12/04/2014 2240   BUN 14 12/04/2014 2240   CREATININE 0.79 12/04/2014 2240   CALCIUM 9.1 12/04/2014 2240   PROT 6.2* 12/04/2014 2240   ALBUMIN 3.8 12/04/2014 2240   AST 25 12/04/2014 2240   ALT 28 12/04/2014 2240   ALKPHOS 69 12/04/2014 2240   BILITOT 0.3 12/04/2014 2240   GFRNONAA >60 12/04/2014 2240   GFRAA >60 12/04/2014 2240      Assessment & Plan:  54 year old female with past medical history of diverticulosis with diverticulitis, adenomatous colon polyps, MS, chronic pain and fibromyalgia seen for follow-up.   1. Chronic constipation/diverticulosis -- fortunately there was no evidence of inflammatory bowel disease at recent colonoscopy. She does have diverticulosis but no evidence of recent diverticulitis. She will continue Colace 200 mg daily. Will try glycerin suppositories to prevent hard stool in the rectum and hopefully facilitate more normal bowel movement. She can use these once daily as needed. She will remain off Amitiza for now I asked that she call should she develop recurrent diverticulitis symptoms   2. Adenomatous colon polyp  -- repeat colonoscopy recommended in 5 years

## 2015-02-07 NOTE — Patient Instructions (Addendum)
Please purchase the following medications over the counter and take as directed: Colace 200 mg daily Glycerin Suppositories-insert 1 suppository into rectum as needed for constipation.  Please follow up with Dr Hilarie Fredrickson as needed.

## 2015-02-08 LAB — CYTOLOGY - PAP

## 2015-05-25 ENCOUNTER — Emergency Department (HOSPITAL_COMMUNITY)
Admission: EM | Admit: 2015-05-25 | Discharge: 2015-05-26 | Disposition: A | Discharge status: Home / Self Care | Payer: Medicare Other | Attending: Emergency Medicine | Admitting: Emergency Medicine

## 2015-05-25 ENCOUNTER — Encounter (HOSPITAL_COMMUNITY): Payer: Self-pay | Admitting: Neurology

## 2015-05-25 DIAGNOSIS — R51 Headache: Secondary | ICD-10-CM | POA: Insufficient documentation

## 2015-05-25 DIAGNOSIS — Z86018 Personal history of other benign neoplasm: Secondary | ICD-10-CM | POA: Insufficient documentation

## 2015-05-25 DIAGNOSIS — Z9889 Other specified postprocedural states: Secondary | ICD-10-CM | POA: Diagnosis not present

## 2015-05-25 DIAGNOSIS — G479 Sleep disorder, unspecified: Secondary | ICD-10-CM | POA: Insufficient documentation

## 2015-05-25 DIAGNOSIS — Z9049 Acquired absence of other specified parts of digestive tract: Secondary | ICD-10-CM | POA: Diagnosis not present

## 2015-05-25 DIAGNOSIS — M797 Fibromyalgia: Secondary | ICD-10-CM | POA: Insufficient documentation

## 2015-05-25 DIAGNOSIS — R1032 Left lower quadrant pain: Secondary | ICD-10-CM | POA: Diagnosis not present

## 2015-05-25 DIAGNOSIS — M1990 Unspecified osteoarthritis, unspecified site: Secondary | ICD-10-CM | POA: Diagnosis not present

## 2015-05-25 DIAGNOSIS — F329 Major depressive disorder, single episode, unspecified: Secondary | ICD-10-CM | POA: Diagnosis not present

## 2015-05-25 DIAGNOSIS — Z8639 Personal history of other endocrine, nutritional and metabolic disease: Secondary | ICD-10-CM | POA: Diagnosis not present

## 2015-05-25 DIAGNOSIS — J449 Chronic obstructive pulmonary disease, unspecified: Secondary | ICD-10-CM | POA: Diagnosis not present

## 2015-05-25 DIAGNOSIS — R634 Abnormal weight loss: Secondary | ICD-10-CM | POA: Insufficient documentation

## 2015-05-25 DIAGNOSIS — R112 Nausea with vomiting, unspecified: Secondary | ICD-10-CM | POA: Diagnosis present

## 2015-05-25 DIAGNOSIS — R197 Diarrhea, unspecified: Secondary | ICD-10-CM | POA: Insufficient documentation

## 2015-05-25 DIAGNOSIS — Z79899 Other long term (current) drug therapy: Secondary | ICD-10-CM | POA: Insufficient documentation

## 2015-05-25 DIAGNOSIS — F17210 Nicotine dependence, cigarettes, uncomplicated: Secondary | ICD-10-CM | POA: Diagnosis not present

## 2015-05-25 DIAGNOSIS — K219 Gastro-esophageal reflux disease without esophagitis: Secondary | ICD-10-CM | POA: Insufficient documentation

## 2015-05-25 DIAGNOSIS — G35 Multiple sclerosis: Secondary | ICD-10-CM | POA: Diagnosis not present

## 2015-05-25 DIAGNOSIS — F419 Anxiety disorder, unspecified: Secondary | ICD-10-CM | POA: Insufficient documentation

## 2015-05-25 LAB — COMPREHENSIVE METABOLIC PANEL
ALT: 15 U/L (ref 14–54)
ANION GAP: 8 (ref 5–15)
AST: 20 U/L (ref 15–41)
Albumin: 4.5 g/dL (ref 3.5–5.0)
Alkaline Phosphatase: 69 U/L (ref 38–126)
BUN: 10 mg/dL (ref 6–20)
CHLORIDE: 107 mmol/L (ref 101–111)
CO2: 25 mmol/L (ref 22–32)
Calcium: 9.7 mg/dL (ref 8.9–10.3)
Creatinine, Ser: 0.96 mg/dL (ref 0.44–1.00)
GFR calc non Af Amer: >60 mL/min (ref >60)
Glucose, Bld: 101 mg/dL — ABNORMAL HIGH (ref 65–99)
POTASSIUM: 4.4 mmol/L (ref 3.5–5.1)
SODIUM: 140 mmol/L (ref 135–145)
Total Bilirubin: 0.2 mg/dL — ABNORMAL LOW (ref 0.3–1.2)
Total Protein: 7.1 g/dL (ref 6.5–8.1)

## 2015-05-25 LAB — CBC
HEMATOCRIT: 44.8 % (ref 36.0–46.0)
HEMOGLOBIN: 14.8 g/dL (ref 12.0–15.0)
MCH: 29.1 pg (ref 26.0–34.0)
MCHC: 33 g/dL (ref 30.0–36.0)
MCV: 88 fL (ref 78.0–100.0)
Platelets: 255 10*3/uL (ref 150–400)
RBC: 5.09 MIL/uL (ref 3.87–5.11)
RDW: 14.3 % (ref 11.5–15.5)
WBC: 5.5 10*3/uL (ref 4.0–10.5)

## 2015-05-25 LAB — LIPASE, BLOOD: LIPASE: 25 U/L (ref 11–51)

## 2015-05-25 LAB — URINALYSIS, ROUTINE W REFLEX MICROSCOPIC
Glucose, UA: NEGATIVE mg/dL
Ketones, ur: 15 mg/dL — AB
Leukocytes, UA: NEGATIVE
Nitrite: NEGATIVE
Protein, ur: NEGATIVE mg/dL
SPECIFIC GRAVITY, URINE: 1.025 (ref 1.005–1.030)
pH: 5.5 (ref 5.0–8.0)

## 2015-05-25 LAB — I-STAT CG4 LACTIC ACID, ED: LACTIC ACID, VENOUS: 0.64 mmol/L (ref 0.5–2.0)

## 2015-05-25 LAB — URINE MICROSCOPIC-ADD ON: Bacteria, UA: NONE SEEN

## 2015-05-25 LAB — POC OCCULT BLOOD, ED: Fecal Occult Bld: NEGATIVE

## 2015-05-25 MED ORDER — PROMETHAZINE HCL 25 MG/ML IJ SOLN
25.0000 mg | Freq: Once | INTRAMUSCULAR | Status: AC
  Administered 2015-05-25: 25 mg via INTRAVENOUS
  Filled 2015-05-25: qty 1

## 2015-05-25 MED ORDER — SODIUM CHLORIDE 0.9 % IV BOLUS (SEPSIS)
1000.0000 mL | Freq: Once | INTRAVENOUS | Status: AC
  Administered 2015-05-25: 1000 mL via INTRAVENOUS

## 2015-05-25 MED ORDER — ONDANSETRON HCL 4 MG/2ML IJ SOLN
4.0000 mg | Freq: Once | INTRAMUSCULAR | Status: AC
  Administered 2015-05-25: 4 mg via INTRAVENOUS
  Filled 2015-05-25: qty 2

## 2015-05-25 MED ORDER — IOHEXOL 300 MG/ML  SOLN
50.0000 mL | Freq: Once | INTRAMUSCULAR | Status: AC | PRN
  Administered 2015-05-25: 50 mL via ORAL

## 2015-05-25 MED ORDER — MORPHINE SULFATE (PF) 4 MG/ML IV SOLN
4.0000 mg | Freq: Once | INTRAVENOUS | Status: AC
  Administered 2015-05-25: 4 mg via INTRAVENOUS
  Filled 2015-05-25: qty 1

## 2015-05-25 NOTE — ED Provider Notes (Signed)
CSN: [id number]     Arrival date & time 05/25/15  1841 History   First MD Initiated Contact with Patient 05/25/15 2144     Chief Complaint  Patient presents with  . Emesis  . Headache  . Abdominal Pain     (Consider location/radiation/quality/duration/timing/severity/associated sxs/prior Treatment) Patient is a 54 y.o. female presenting with vomiting, headaches, and abdominal pain. The history is provided by the patient and medical records. No language interpreter was used.  Emesis Associated symptoms: abdominal pain and diarrhea   Associated symptoms: no headaches   Headache Associated symptoms: abdominal pain, diarrhea, fever (subjective fever), nausea and vomiting   Associated symptoms: no back pain, no cough, no fatigue, no neck pain, no neck stiffness and no weakness   Abdominal Pain Associated symptoms: diarrhea, fever (subjective fever), nausea and vomiting   Associated symptoms: no chest pain, no constipation, no cough, no dysuria, no fatigue, no hematuria and no shortness of breath     Sierra Morris is a 54 y.o. female  with a hx of MS, chronic depression, anorexia, pituitary microadenoma, diverticulitis, GERD, COPD, fibromyalgia presents to the Emergency Department complaining of gradual, persistent, progressively worsening nausea and left lower quadrant abdominal pain onset 1 week ago. Patient reports that the last time she had diverticulitis she was prescribed Flagyl which made her very "sick."  She reports she attempted to treat her potential diverticulitis at home by switching to a purely liquid diet for one week. She was reports persistent nausea and vomiting to approximately 3-5 times per day. She reports her abdominal pain is more generalized now located along the lower abdomen though it remains worst in the left lower quadrant. She reports subjective fevers, chills. She reports diarrhea and stools are dark and tarry without bright red blood. She denies history of bleeding  ulcer. She has no epigastric abdominal pain. Patient reports that 3 weeks ago she weighed 115 pounds and when weighed tonight weighs 94 pounds. She reports normal eating habits in the weeks prior to the onset of her abdominal pain nausea and vomiting. Nothing seems to make her symptoms better or worse.  Pt denies neck pain, chest pain, shortness of breath, weakness, dizziness, syncope, dysuria.  Patient is tearful throughout history taking and reports mild, generalized headache the same as her normal headaches.   Past Medical History  Diagnosis Date  . MS (multiple sclerosis) ([place])   . Chronic depression   . Anorexia nervosa     patient denies  . Pituitary microadenoma ([place])   . Sleep disorder   . Tremor   . Osteoarthritis   . Osteoporosis   . Diverticulosis   . Anxiety   . Arthritis   . Chronic headaches   . Colitis   . Fibromyalgia   . Tubular adenoma of colon   . Emphysema/COPD [facility])     patient unaware  . Hyperthyroidism     borderline per patient  . History of blood in urine   . GERD (gastroesophageal reflux disease)   . PONV (postoperative nausea and vomiting)   . Tubular adenoma of colon    Past Surgical History  Procedure Laterality Date  . Cholecystectomy    . Oophorectomy    . Cesarean section      x 2  . Appendectomy    . Tonsillectomy    . Dental surgery    . Abdominal hysterectomy  2008  . Colonoscopy    . Laparoscopy    . Tumor breast bone removal benign    .  Laparoscopic bilateral salpingo oopherectomy Bilateral 11/30/2014    Procedure: LAPAROSCOPIC BILATERAL SALPINGO OOPHORECTOMY;  Surgeon: Molli Posey, MD;  Location: [facility];  Service: Gynecology;  Laterality: Bilateral;  converted to open  . Laparotomy N/A 11/30/2014    Procedure: POSSIBLE EXPLORATORY LAPAROTOMY, ;  Surgeon: Molli Posey, MD;  Location: [facility];  Service: Gynecology;  Laterality: N/A;  . Salpingoophorectomy Bilateral 11/30/2014    Procedure: BILATERAL SALPINGO OOPHORECTOMY;   Surgeon: Molli Posey, MD;  Location: [facility];  Service: Gynecology;  Laterality: Bilateral;   Family History  Problem Relation Age of Onset  . Depression Father   . Diabetes Father   . Colon cancer Paternal Grandmother   . Colon polyps Mother   . Colon polyps Father   . COPD Father   . Hypertension Mother   . Diabetes Maternal Grandmother   . Pancreatitis Son   . Rectal cancer Paternal Grandmother   . Irritable bowel syndrome      cousin   Social History  Substance Use Topics  . Smoking status: Current Every Day Smoker -- 0.25 packs/day for .5 years    Types: Cigarettes  . Smokeless tobacco: Never Used  . Alcohol Use: 0.0 oz/week    0 Standard drinks or equivalent per week     Comment: rare   OB History    Gravida Para Term Preterm AB TAB SAB Ectopic Multiple Living   3 2 2  1   1 1 3      Review of Systems  Constitutional: Positive for fever (subjective fever) and unexpected weight change (weight loss). Negative for diaphoresis, appetite change and fatigue.  HENT: Negative for mouth sores and trouble swallowing.   Eyes: Negative for visual disturbance.  Respiratory: Negative for cough, chest tightness, shortness of breath, wheezing and stridor.   Cardiovascular: Negative for chest pain and palpitations.  Gastrointestinal: Positive for nausea, vomiting, abdominal pain and diarrhea. Negative for constipation, blood in stool, abdominal distention and rectal pain.       Dark tarry stools  Endocrine: Negative for polydipsia, polyphagia and polyuria.  Genitourinary: Negative for dysuria, urgency, frequency, hematuria, flank pain and difficulty urinating.  Musculoskeletal: Negative for back pain, neck pain and neck stiffness.  Skin: Negative for rash.  Allergic/Immunologic: Negative for immunocompromised state.  Neurological: Negative for syncope, weakness, light-headedness and headaches.  Hematological: Negative for adenopathy. Does not bruise/bleed easily.   Psychiatric/Behavioral: Negative for confusion and sleep disturbance. The patient is not nervous/anxious.   All other systems reviewed and are negative.     Allergies  Wheat bran; Paroxetine hcl; Pregabalin; and Suboxone  Home Medications   Prior to Admission medications   Medication Sig Start Date End Date Taking? Authorizing Provider  buPROPion (WELLBUTRIN XL) 150 MG 24 hr tablet Take 450 mg by mouth daily.   Yes Historical Provider, MD  carisoprodol (SOMA) 350 MG tablet Take 350 mg by mouth 4 (four) times daily as needed for muscle spasms.   Yes Historical Provider, MD  clonazePAM (KLONOPIN) 0.5 MG tablet Take 0.25-5 mg by mouth 4 (four) times daily. Take 1 tablet in morning, 1/2 tablet afternoon, 1/2 tablet evening, 2 tablets at bedtime   Yes Historical Provider, MD  dicyclomine (BENTYL) 20 MG tablet Take 20 mg by mouth every 6 (six) hours as needed for spasms.   Yes Historical Provider, MD  gabapentin (NEURONTIN) 300 MG capsule Take 300 mg by mouth 4 (four) times daily as needed (pain).    Yes Historical Provider, MD  Glatiramer Acetate 40  MG/ML SOSY Inject 1 each into the skin 3 (three) times a week.    Yes Historical Provider, MD  hydrOXYzine (ATARAX/VISTARIL) 25 MG tablet Take 25 mg by mouth every 8 (eight) hours as needed for itching.  05/24/15  Yes Historical Provider, MD  methylphenidate (RITALIN) 10 MG tablet Take 10 mg by mouth 3 (three) times daily.   Yes Historical Provider, MD  omeprazole (PRILOSEC) 20 MG capsule Take 20 mg by mouth daily.   Yes Historical Provider, MD  ondansetron (ZOFRAN-ODT) 8 MG disintegrating tablet Take 8 mg by mouth every 8 (eight) hours as needed for nausea.  08/15/14  Yes Historical Provider, MD  REXULTI 2 MG TABS Take 2 mg by mouth daily. 04/27/15  Yes Historical Provider, MD  HYDROcodone-acetaminophen (NORCO/VICODIN) 5-325 MG tablet Take 1-2 tablets by mouth every 6 (six) hours as needed for moderate pain or severe pain. 05/26/15   Keera Altidor, PA-C   BP 96/69 mmHg  Pulse 91  Temp(Src) 97.4 F (36.3 C) (Rectal)  Resp 22  SpO2 97% Physical Exam  Constitutional: She appears well-developed and well-nourished. No distress.  Awake, alert, nontoxic appearance  HENT:  Head: Normocephalic and atraumatic.  Mouth/Throat: Oropharynx is clear and moist. No oropharyngeal exudate.  Eyes: Conjunctivae are normal. No scleral icterus.  Neck: Normal range of motion. Neck supple.  Cardiovascular: Normal rate, regular rhythm, normal heart sounds and intact distal pulses.   Pulmonary/Chest: Effort normal and breath sounds normal. No respiratory distress. She has no wheezes.  Equal chest expansion  Abdominal: Soft. Bowel sounds are normal. She exhibits no distension and no mass. There is tenderness in the suprapubic area and left lower quadrant. There is guarding. There is no rebound and no CVA tenderness.  Generalized tenderness in the lower abdomen worsening in the suprapubic and left lower quadrants with guarding in the left lower quadrant No rebound No CVA tenderness  Musculoskeletal: Normal range of motion. She exhibits no edema.  Neurological: She is alert.  Speech is clear and goal oriented Moves extremities without ataxia  Skin: Skin is warm and dry. She is not diaphoretic.  Psychiatric: She has a normal mood and affect.  Nursing note and vitals reviewed.   ED Course  Procedures (including critical care time) Labs Review Labs Reviewed  COMPREHENSIVE METABOLIC PANEL - Abnormal; Notable for the following:    Glucose, Bld 101 (*)    Total Bilirubin 0.2 (*)    All other components within normal limits  URINALYSIS, ROUTINE W REFLEX MICROSCOPIC (NOT AT [facility]) - Abnormal; Notable for the following:    Color, Urine AMBER (*)    Hgb urine dipstick TRACE (*)    Bilirubin Urine SMALL (*)    Ketones, ur 15 (*)    All other components within normal limits  URINE MICROSCOPIC-ADD ON - Abnormal; Notable for the following:     Squamous Epithelial / LPF 6-30 (*)    Crystals CA OXALATE CRYSTALS (*)    All other components within normal limits  I-STAT CG4 LACTIC ACID, ED - Abnormal; Notable for the following:    Lactic Acid, Venous 0.39 (*)    All other components within normal limits  LIPASE, BLOOD  CBC  I-STAT CG4 LACTIC ACID, ED  POC OCCULT BLOOD, ED    Imaging Review Ct Abdomen Pelvis W Contrast  05/26/2015  CLINICAL DATA:  Acute onset of nausea and vomiting. Headache and left lower quadrant abdominal pain. Initial encounter. EXAM: CT ABDOMEN AND PELVIS WITH CONTRAST TECHNIQUE: Multidetector CT  imaging of the abdomen and pelvis was performed using the standard protocol following bolus administration of intravenous contrast. CONTRAST:  6mL OMNIPAQUE IOHEXOL 300 MG/ML  SOLN COMPARISON:  CT of the abdomen and pelvis from 11/22/2014 FINDINGS: Minimal bibasilar atelectasis is noted. The liver and spleen are unremarkable in appearance. The patient is status post cholecystectomy, with clips noted at the gallbladder fossa. The pancreas and adrenal glands are unremarkable. The kidneys are unremarkable in appearance. There is no evidence of hydronephrosis. No renal or ureteral stones are seen. No perinephric stranding is appreciated. No free fluid is identified. The small bowel is unremarkable in appearance. The stomach is within normal limits. No acute vascular abnormalities are seen. Scattered calcification is noted along the abdominal aorta and its branches. The patient is status post appendectomy. The colon is unremarkable in appearance. The bladder is mildly distended and grossly unremarkable. The patient is status post hysterectomy. No suspicious adnexal masses are seen. No inguinal lymphadenopathy is seen. No acute osseous abnormalities are identified. IMPRESSION: 1. No acute abnormality seen within the abdomen or pelvis. 2. Scattered calcification along the abdominal aorta and its branches. Electronically Signed   By:  Garald Balding M.D.   On: 05/26/2015 01:39   I have personally reviewed and evaluated these images and lab results as part of my medical decision-making.   EKG Interpretation None      MDM   Final diagnoses:  Left lower quadrant pain  Non-intractable vomiting with nausea, vomiting of unspecified type   [name] presents with one week of nausea, vomiting, dark hard stools and left lower quadrant abdominal pain.  Concern for diverticulitis, colitis vs small bowel obstruction.  Will obtain CT scan.  3:26 AM Labs and CT scan reassuring. Patient tolerating by mouth here in the emergency department. Repeat exam shows soft and nontender abdomen.  Though she has a history of diverticulitis there is no evidence of this on her CT scan.  Patient has both a primary care and gastroenterologist along with home prescription for Zofran. Will give short course of pain control. She is to follow-up with both PCP and gastroenterology. Should return precautions given including fevers, persistent vomiting or other concerns.  The patient was discussed with and seen by Dr. Leonides Schanz who agrees with the treatment plan.  BP 96/69 mmHg  Pulse 91  Temp(Src) 97.4 F (36.3 C) (Rectal)  Resp 22  SpO2 97%    Abigail Butts, PA-C 05/26/15 [phone]

## 2015-05-25 NOTE — ED Notes (Signed)
Pt reports n/v x 1 week, has been doing liquid diet for 1 week and still vomiting. Also has h/a and generalized abd pain mainly LLQ. Has hx of diverticulitis. Pt is tearful.

## 2015-05-26 ENCOUNTER — Encounter (HOSPITAL_COMMUNITY): Payer: Self-pay

## 2015-05-26 ENCOUNTER — Emergency Department (HOSPITAL_COMMUNITY): Payer: Medicare Other

## 2015-05-26 DIAGNOSIS — R112 Nausea with vomiting, unspecified: Secondary | ICD-10-CM | POA: Diagnosis not present

## 2015-05-26 LAB — I-STAT CG4 LACTIC ACID, ED: Lactic Acid, Venous: 0.39 mmol/L — ABNORMAL LOW (ref 0.5–2.0)

## 2015-05-26 MED ORDER — METOCLOPRAMIDE HCL 5 MG/ML IJ SOLN
10.0000 mg | Freq: Once | INTRAMUSCULAR | Status: AC
  Administered 2015-05-26: 10 mg via INTRAVENOUS
  Filled 2015-05-26: qty 2

## 2015-05-26 MED ORDER — HYDROCODONE-ACETAMINOPHEN 5-325 MG PO TABS: 1.0000 | ORAL_TABLET | Freq: Four times a day (QID) | ORAL | Status: DC | PRN

## 2015-05-26 MED ORDER — MORPHINE SULFATE (PF) 4 MG/ML IV SOLN: 4.0000 mg | Freq: Once | INTRAVENOUS | Status: DC

## 2015-05-26 MED ORDER — DIPHENHYDRAMINE HCL 50 MG/ML IJ SOLN
12.5000 mg | Freq: Once | INTRAMUSCULAR | Status: AC
  Administered 2015-05-26: 12.5 mg via INTRAVENOUS
  Filled 2015-05-26: qty 1

## 2015-05-26 MED ORDER — IOHEXOL 300 MG/ML  SOLN
80.0000 mL | Freq: Once | INTRAMUSCULAR | Status: AC | PRN
  Administered 2015-05-26: 80 mL via INTRAVENOUS

## 2015-05-26 NOTE — ED Provider Notes (Signed)
Medical screening examination/treatment/procedure(s) were conducted as a shared visit with non-physician practitioner(s) and myself.  I personally evaluated the patient during the encounter.   EKG Interpretation None      Pt is a 54 y.o. female who presents nausea and vomiting for 1 week, left lower quadrant pain. Has h/o diverticulitis. Labs unremarkable. Urine shows trace hemoglobin but no other sign of infection. CT scan shows no acute abnormality. Her abdominal exam on my evaluation is benign. She is tolerating by mouth. We'll discharge him with outpatient follow-up. She has a PCP and gastroenterologist. Discussed return precautions.  [place], DO 05/26/15 [phone]

## 2015-05-26 NOTE — ED Notes (Signed)
Pt to CT at this time.

## 2015-05-26 NOTE — Discharge Instructions (Signed)
1. Medications: zofran, vicodin, usual home medications 2. Treatment: rest, drink plenty of fluids, advance diet slowly 3. Follow Up: Please followup with your primary doctor in 2 days for discussion of your diagnoses and further evaluation after today's visit; if you do not have a primary care doctor use the resource guide provided to find one; Please return to the ER for persistent vomiting, high fevers or worsening symptoms     Abdominal Pain, Adult Many things can cause abdominal pain. Usually, abdominal pain is not caused by a disease and will improve without treatment. It can often be observed and treated at home. Your health care provider will do a physical exam and possibly order blood tests and X-rays to help determine the seriousness of your pain. However, in many cases, more time must pass before a clear cause of the pain can be found. Before that point, your health care provider may not know if you need more testing or further treatment. HOME CARE INSTRUCTIONS Monitor your abdominal pain for any changes. The following actions may help to alleviate any discomfort you are experiencing:  Only take over-the-counter or prescription medicines as directed by your health care provider.  Do not take laxatives unless directed to do so by your health care provider.  Try a clear liquid diet (broth, tea, or water) as directed by your health care provider. Slowly move to a bland diet as tolerated. SEEK MEDICAL CARE IF:  You have unexplained abdominal pain.  You have abdominal pain associated with nausea or diarrhea.  You have pain when you urinate or have a bowel movement.  You experience abdominal pain that wakes you in the night.  You have abdominal pain that is worsened or improved by eating food.  You have abdominal pain that is worsened with eating fatty foods.  You have a fever. SEEK IMMEDIATE MEDICAL CARE IF:  Your pain does not go away within 2 hours.  You keep throwing up  (vomiting).  Your pain is felt only in portions of the abdomen, such as the right side or the left lower portion of the abdomen.  You pass bloody or black tarry stools. MAKE SURE YOU:  Understand these instructions.  Will watch your condition.  Will get help right away if you are not doing well or get worse.   This information is not intended to replace advice given to you by your health care provider. Make sure you discuss any questions you have with your health care provider.   Document Released: 04/03/2005 Document Revised: 03/15/2015 Document Reviewed: 03/03/2013 Elsevier Interactive Patient Education [employer].

## 2015-05-26 NOTE — ED Notes (Signed)
Pt transported to CT ?

## 2015-06-14 ENCOUNTER — Telehealth: Payer: Self-pay | Admitting: Internal Medicine

## 2015-06-14 NOTE — Telephone Encounter (Signed)
History of chronic constipation and incomplete evacuation Recent colonoscopy She was using Colace time of her last office visit which was helping. Glycerin suppositories when necessary to avoid hard stool. Please ensure that she is having regular bowel movements as this has contributed to abdominal pain in the past. For nausea she can take Zofran 4-8 mg every 8 hours as needed. Phenergan 12.5-25 mg can be used every 8 hours as needed for nausea not relieved by Zofran. The latter medication can make her sleepy and she should not drive while using it She should see her PCP about her falling Can increase omeprazole to 40 mg once daily 30 minutes before breakfast to see if this helps nausea Office follow-up to discuss further

## 2015-06-14 NOTE — Telephone Encounter (Signed)
Pt states she has not been eating solid food for several weeks. States she has not been having regular bowel movements due to this. She is taking the colace. Pt states she is already taking the zofran 8mg  6 times a day. Pt states she cannot take phenergan because it makes her legs cramp. Pt states she is having a lot of pain in her left side and is afraid that she has diverticulitis. Pt reports that when she had a bowel movement the pain was so bad she "thinks she blacked out." Pt states she did not call because the last time she had diverticulitis and took flagyl it made her very sick. Please advise.

## 2015-06-14 NOTE — Telephone Encounter (Signed)
Pt states that she has been having problems with nausea for about a month and that she has lost 13 pounds. States she was seen in the ER but nothing was found. Pt also states that she has been having pain in her LLQ and that today the pain is going all the way around to her back, also states her stomach is very sore. Pt reports she has fallen 3 times and she just doesn't know what to do. She is taking zofran but still having nausea. Please advise.

## 2015-06-15 MED ORDER — AMOXICILLIN-POT CLAVULANATE 875-125 MG PO TABS: 1.0000 | ORAL_TABLET | Freq: Two times a day (BID) | ORAL | Status: DC

## 2015-06-15 NOTE — Telephone Encounter (Signed)
CT was done 05/26/15 and results are in epic.

## 2015-06-15 NOTE — Telephone Encounter (Signed)
Spoke with pt and she is aware. Script sent to pharmacy. Will call pt back when appt available.

## 2015-06-15 NOTE — Telephone Encounter (Signed)
She has had multiple CTs in the past several years so would like to avoid repeat CT if possible Empiric treatment with Augmentin 875 twice a day 7 days Needs office visit with me or app for check up

## 2015-06-15 NOTE — Telephone Encounter (Signed)
Maximum Zofran doses 24 mg per 24 hours Reportedly she just had a CT scan?? If so we need to see the results as this would rule out diverticulitis I expect this is constipation related and may be due to colonic spasm in this setting Let's try to get the CT scan results today to rule out diverticulitis Previously prescribed Bentyl should help with left-sided and lower abdominal crampy pain

## 2015-06-16 ENCOUNTER — Telehealth: Payer: Self-pay

## 2015-06-16 NOTE — Telephone Encounter (Signed)
-----   Message from Algernon Huxley, RN sent at 06/15/2015  2:15 PM EST ----- Regarding: OV Needs OV with Pyrlte  ----- Message -----    From: Jerene Bears, MD    Sent: 06/15/2015  11:37 AM      To: Algernon Huxley, RN  Do not overbook for her followup please Thanks jmp

## 2015-06-16 NOTE — Telephone Encounter (Signed)
Pt scheduled to see Dr. Hilarie Fredrickson 08/10/15@2pm . Appt letter mailed to pt.

## 2015-07-07 ENCOUNTER — Telehealth: Payer: Self-pay | Admitting: Internal Medicine

## 2015-07-07 MED ORDER — OMEPRAZOLE 40 MG PO CPDR: 40.0000 mg | DELAYED_RELEASE_CAPSULE | Freq: Every day | ORAL | Status: DC

## 2015-07-07 MED ORDER — DICYCLOMINE HCL 20 MG PO TABS: 20.0000 mg | ORAL_TABLET | Freq: Four times a day (QID) | ORAL | Status: DC | PRN

## 2015-07-07 NOTE — Telephone Encounter (Signed)
Per last phone note prilosec was to be increased to 40 mg and authorization for dicyclomine.  She is having continued LLQ pain.  She will come in next week 07-12-15 for eval. Rsxsent

## 2015-07-12 ENCOUNTER — Ambulatory Visit: Payer: Medicare Other | Admitting: Physician Assistant

## 2015-08-10 ENCOUNTER — Ambulatory Visit (INDEPENDENT_AMBULATORY_CARE_PROVIDER_SITE_OTHER): Payer: Medicare Other | Admitting: Internal Medicine

## 2015-08-10 ENCOUNTER — Encounter: Payer: Self-pay | Admitting: Internal Medicine

## 2015-08-10 VITALS — BP 112/66 | HR 104 | Ht 60.25 in | Wt 97.1 lb

## 2015-08-10 DIAGNOSIS — K5900 Constipation, unspecified: Secondary | ICD-10-CM

## 2015-08-10 DIAGNOSIS — R1030 Lower abdominal pain, unspecified: Secondary | ICD-10-CM | POA: Diagnosis not present

## 2015-08-10 DIAGNOSIS — K589 Irritable bowel syndrome without diarrhea: Secondary | ICD-10-CM

## 2015-08-10 DIAGNOSIS — K5903 Drug induced constipation: Secondary | ICD-10-CM

## 2015-08-10 DIAGNOSIS — T402X5A Adverse effect of other opioids, initial encounter: Secondary | ICD-10-CM

## 2015-08-10 DIAGNOSIS — Z8719 Personal history of other diseases of the digestive system: Secondary | ICD-10-CM

## 2015-08-10 DIAGNOSIS — K5909 Other constipation: Secondary | ICD-10-CM

## 2015-08-10 MED ORDER — DICYCLOMINE HCL 20 MG PO TABS: 20.0000 mg | ORAL_TABLET | Freq: Four times a day (QID) | ORAL | Status: DC | PRN

## 2015-08-10 MED ORDER — NALOXEGOL OXALATE 25 MG PO TABS: 25.0000 mg | ORAL_TABLET | Freq: Every day | ORAL | Status: DC

## 2015-08-10 NOTE — Patient Instructions (Signed)
We have sent the following medications to your pharmacy for you to pick up at your convenience: Movantik 25 mg daily Bentyl  Please follow up with Dr Hilarie Fredrickson in 4 months.

## 2015-08-10 NOTE — Progress Notes (Signed)
Subjective:    Patient ID: Sierra Morris, female    DOB: 1960/07/09, 55 y.o.   MRN: [id number]  HPI Sierra Morris is as 55 yo female with a past medical history of diverticulosis and diverticulitis, adenomatous colon polyps, chronic constipation, chronic pain on narcotics, MS and fibromyalgia who is here for follow-up. Last seen in the office in August 2016. She had a colonoscopy in March of last year which showed a 3 mm tubular adenoma in the ascending colon and mild inflammatory changes in the sigmoid and distal rectum near her diverticular segments. She also had melanosis coli. Biopsy showed no active inflammation.  She has chronic constipation which had improved after having bilateral oophorectomy but constipation has now returned. In late November she developed severe left lower quadrant pain had an abdominal CT which was largely unremarkable. She was then empirically treated for diverticulitis with Augmentin 7 days. This improved her left lower quadrant pain dramatically. It also improved nausea.  Recently she's been having significant onstipation going to the bathroom about once per week. When she does have a bowel movement she has intense lower abdominal cramping which can be so severe that "I want to pass out". This can trigger nausea and vomiting. She is using stool softeners each day. In between bowel movements she is not having abdominal pain. She is taking hydrocodone for chronic pain twice daily.   Review of Systems As per history of present illness, otherwise negative  Current Medications, Allergies, Past Medical History, Past Surgical History, Family History and Social History were reviewed in [employer] record.     Objective:   Physical Exam BP 112/66 mmHg  Pulse 104  Ht 5' 0.25" (1.53 m)  Wt 97 lb 2 oz (44.056 kg)  BMI 18.82 kg/m2 Constitutional: Well-developed and thin female . No distress. HEENT: Normocephalic and atraumatic.  No scleral  icterus. Neck: Neck supple. Trachea midline. Cardiovascular: Normal rate, regular rhythm and intact distal pulses. No M/R/G Pulmonary/chest: Effort normal and breath sounds normal. No wheezing, rales or rhonchi. Abdominal: Soft, lower abdominal tenderness without rebound or guarding, nondistended. Bowel sounds active throughout.  Extremities: no clubbing, cyanosis, or edema Neurological: Alert and oriented to person place and time. Skin: Skin is warm and dry.  Psychiatric: Normal mood and affect. Behavior is normal.  CBC    Component Value Date/Time   WBC 5.5 05/25/2015 1900   RBC 5.09 05/25/2015 1900   HGB 14.8 05/25/2015 1900   HCT 44.8 05/25/2015 1900   PLT 255 05/25/2015 1900   MCV 88.0 05/25/2015 1900   MCH 29.1 05/25/2015 1900   MCHC 33.0 05/25/2015 1900   RDW 14.3 05/25/2015 1900   LYMPHSABS 2.7 12/04/2014 2240   MONOABS 0.4 12/04/2014 2240   EOSABS 0.3 12/04/2014 2240   BASOSABS 0.0 12/04/2014 2240    CMP     Component Value Date/Time   NA 140 05/25/2015 1900   K 4.4 05/25/2015 1900   CL 107 05/25/2015 1900   CO2 25 05/25/2015 1900   GLUCOSE 101* 05/25/2015 1900   BUN 10 05/25/2015 1900   CREATININE 0.96 05/25/2015 1900   CALCIUM 9.7 05/25/2015 1900   PROT 7.1 05/25/2015 1900   ALBUMIN 4.5 05/25/2015 1900   AST 20 05/25/2015 1900   ALT 15 05/25/2015 1900   ALKPHOS 69 05/25/2015 1900   BILITOT 0.2* 05/25/2015 1900   GFRNONAA >60 05/25/2015 1900   GFRAA >60 05/25/2015 1900   EXAM: CT ABDOMEN AND PELVIS WITH CONTRAST  TECHNIQUE: Multidetector CT imaging of the abdomen and pelvis was performed using the standard protocol following bolus administration of intravenous contrast.   CONTRAST:  39mL OMNIPAQUE IOHEXOL 300 MG/ML  SOLN   COMPARISON:  CT of the abdomen and pelvis from 11/22/2014   FINDINGS: Minimal bibasilar atelectasis is noted.   The liver and spleen are unremarkable in appearance. The patient is status post cholecystectomy, with clips  noted at the gallbladder fossa. The pancreas and adrenal glands are unremarkable.   The kidneys are unremarkable in appearance. There is no evidence of hydronephrosis. No renal or ureteral stones are seen. No perinephric stranding is appreciated.   No free fluid is identified. The small bowel is unremarkable in appearance. The stomach is within normal limits. No acute vascular abnormalities are seen. Scattered calcification is noted along the abdominal aorta and its branches.   The patient is status post appendectomy. The colon is unremarkable in appearance.   The bladder is mildly distended and grossly unremarkable. The patient is status post hysterectomy. No suspicious adnexal masses are seen. No inguinal lymphadenopathy is seen.   No acute osseous abnormalities are identified.   IMPRESSION: 1. No acute abnormality seen within the abdomen or pelvis. 2. Scattered calcification along the abdominal aorta and its branches.     Electronically Signed   By: Garald Balding M.D.   On: 05/26/2015 01:39        Assessment & Plan:   55 yo female with a past medical history of diverticulosis and diverticulitis, adenomatous colon polyps, chronic constipation, chronic pain on narcotics, MS and fibromyalgia who is here for follow-up.   1. Chronic constipation/ history of diverticulitis/IBS-- despite the unremarkable CT scan, her left lower quadrant abdominal pain improved dramatically with Augmentin and has resolved. This argues strongly for diverticulitis. She is having narcotic induced constipation. No response previously to Amitiza or MiraLAX. I recommended Movantik 25 mg daily. Call if nausea or abdominal pain or diarrhea. She can continue Colace if necessary. She uses Bentyl for cramping lower abdominal pain which can continue at current dose.  Four-month follow-up, sooner if necessary 25 minutes spent with the patient today. Greater than 50% was spent in counseling and coordination of  care with the patient

## 2015-08-28 ENCOUNTER — Telehealth: Payer: Self-pay | Admitting: Internal Medicine

## 2015-08-28 ENCOUNTER — Encounter: Payer: Self-pay | Admitting: *Deleted

## 2015-08-28 NOTE — Telephone Encounter (Signed)
I did not previously receive anything from the pharmacy about this. I contacted pharmacy today and they have faxed a prior authorization request for Movantik. I contacted Megan at [facility] (phone [phone]). Advised patient has opiod induced constipation and has tried and failed Amitiza, Miralax and OTC meds. Request has been sent to pharmacist for review (they want patient to have tried and failed lactulose). [id number].

## 2015-08-29 NOTE — Telephone Encounter (Signed)
Dr Hilarie Fredrickson- Patient's insurance (Medicare D) has denied patient's Movantik until she has tried and failed consulose, Enulose, Cabin crew or lactulose. Please advise.Marland KitchenMarland KitchenMarland Kitchen

## 2015-08-31 MED ORDER — LACTULOSE 10 GM/15ML PO SOLN: 20.0000 g | Freq: Two times a day (BID) | ORAL | Status: DC

## 2015-08-31 NOTE — Telephone Encounter (Signed)
Ok, let pt know we have to try 1 more med before movantik will be approved Lactulose 30 ml BID Call with update after 2-3 week trial

## 2015-08-31 NOTE — Addendum Note (Signed)
Addended by: Larina Bras on: 08/31/2015 01:45 PM   Modules accepted: Orders

## 2015-08-31 NOTE — Telephone Encounter (Signed)
Patient advised that insurance requires we try lactulose prior to approving Movantik. Patient verbalizes understanding and will call us back with an update in 2-3 weeks.

## 2015-09-05 ENCOUNTER — Telehealth: Payer: Self-pay | Admitting: Internal Medicine

## 2015-09-05 MED ORDER — NALOXEGOL OXALATE 25 MG PO TABS: 25.0000 mg | ORAL_TABLET | Freq: Every day | ORAL | Status: DC

## 2015-09-05 NOTE — Telephone Encounter (Signed)
Pt states lactulose is causing nausea, cramping, and dizziness. Pt would like to try movantik. Please advise.

## 2015-09-05 NOTE — Telephone Encounter (Signed)
Pt aware and script called in for pt. 

## 2015-09-05 NOTE — Telephone Encounter (Signed)
Intolerance now to lactulose Trial of Movantik 25 mg every am

## 2015-09-25 ENCOUNTER — Telehealth: Payer: Self-pay | Admitting: Internal Medicine

## 2015-09-25 NOTE — Telephone Encounter (Signed)
Movantik prior authorization initiated through [email or web address]

## 2015-10-06 ENCOUNTER — Other Ambulatory Visit (HOSPITAL_COMMUNITY): Payer: Self-pay

## 2015-10-09 ENCOUNTER — Ambulatory Visit (HOSPITAL_COMMUNITY)
Admission: RE | Admit: 2015-10-09 | Discharge: 2015-10-09 | Disposition: A | Discharge status: Home / Self Care | Payer: Medicare Other | Source: Ambulatory Visit | Attending: Obstetrics and Gynecology | Admitting: Obstetrics and Gynecology

## 2015-10-09 DIAGNOSIS — M810 Age-related osteoporosis without current pathological fracture: Secondary | ICD-10-CM | POA: Insufficient documentation

## 2015-10-09 MED ORDER — ZOLEDRONIC ACID 5 MG/100ML IV SOLN
INTRAVENOUS | Status: AC
Start: 2015-10-09 — End: 2015-10-09
  Administered 2015-10-09: 5 mg
  Filled 2015-10-09: qty 100

## 2015-10-09 MED ORDER — ZOLEDRONIC ACID 5 MG/100ML IV SOLN: 5.0000 mg | Freq: Once | INTRAVENOUS | Status: DC

## 2015-10-13 ENCOUNTER — Other Ambulatory Visit: Payer: Self-pay | Admitting: Internal Medicine

## 2015-10-13 ENCOUNTER — Telehealth: Payer: Self-pay | Admitting: Internal Medicine

## 2015-10-13 NOTE — Telephone Encounter (Signed)
Rx was sent  

## 2015-10-27 ENCOUNTER — Telehealth: Payer: Self-pay | Admitting: Internal Medicine

## 2015-10-27 ENCOUNTER — Other Ambulatory Visit: Payer: Self-pay | Admitting: Internal Medicine

## 2015-10-27 MED ORDER — NALOXEGOL OXALATE 25 MG PO TABS: 25.0000 mg | ORAL_TABLET | Freq: Every day | ORAL | Status: DC

## 2015-10-27 NOTE — Telephone Encounter (Signed)
Rx sent 

## 2016-01-01 ENCOUNTER — Other Ambulatory Visit: Payer: Self-pay | Admitting: Internal Medicine

## 2016-01-01 ENCOUNTER — Ambulatory Visit (INDEPENDENT_AMBULATORY_CARE_PROVIDER_SITE_OTHER): Payer: Medicare Other | Admitting: Internal Medicine

## 2016-01-01 ENCOUNTER — Encounter: Payer: Self-pay | Admitting: Internal Medicine

## 2016-01-01 VITALS — BP 98/64 | HR 96 | Ht 60.25 in | Wt 96.4 lb

## 2016-01-01 DIAGNOSIS — K589 Irritable bowel syndrome without diarrhea: Secondary | ICD-10-CM | POA: Diagnosis not present

## 2016-01-01 DIAGNOSIS — K5900 Constipation, unspecified: Secondary | ICD-10-CM | POA: Diagnosis not present

## 2016-01-01 DIAGNOSIS — K5909 Other constipation: Secondary | ICD-10-CM

## 2016-01-01 DIAGNOSIS — R198 Other specified symptoms and signs involving the digestive system and abdomen: Secondary | ICD-10-CM

## 2016-01-01 MED ORDER — MESALAMINE 1000 MG RE SUPP: 1000.0000 mg | Freq: Every day | RECTAL | Status: DC

## 2016-01-01 NOTE — Progress Notes (Signed)
Subjective:    Patient ID: Sierra Morris, female    DOB: 09/19/1960, 55 y.o.   MRN: [id number]  HPI Sierra Morris is a 55 year old female with a history of diverticulosis with history of diverticulitis, adenomatous colon polyps, chronic constipation, chronic pain on chronic narcotics, MS and fibromyalgia who is here for follow-up. She was last seen in February 2017. At that time she was started on Movantik 25 mg daily. She reports that she is going more frequently and has had a favorable response to the new medication. She at times still fills like she is not fully evacuated despite having more frequent bowel movement. She does continue to feel rectal pressure which is worse before bowel movement. It can be present after bowel movement as well. No bleeding such as blood in her stool, rectal bleeding or melena. She denies abdominal pain today. She was told recently by primary care that her cholesterol and blood sugars were elevated and she is getting ready to change her diet. She is eating gluten-free but planning to cut out "white sugar" from her diet.  Last colonoscopy reviewed March 2016. 3 mm tubular adenoma in the ascending colon and mild inflammatory changes in the sigmoid and distal rectum near diverticular segments. Biopsy showed melanosis without active inflammation.  Review of Systems As per history of present illness, otherwise negative   Current Medications, Allergies, Past Medical History, Past Surgical History, Family History and Social History were reviewed in [employer] record.     Objective:   Physical Exam BP 98/64 mmHg  Pulse 96  Ht 5' 0.25" (1.53 m)  Wt 96 lb 6 oz (43.715 kg)  BMI 18.67 kg/m2 Constitutional: Well-developed and well-nourished. No distress. HEENT: Normocephalic and atraumatic. Marland Kitchen Conjunctivae are normal.  No scleral icterus. Neck: Neck supple. Trachea midline. Cardiovascular: Normal rate, regular rhythm and intact distal pulses. No  M/R/G Pulmonary/chest: Effort normal and breath sounds normal. No wheezing, rales or rhonchi. Abdominal: Soft, thin, nontender, nondistended. Bowel sounds active throughout.  Extremities: no clubbing, cyanosis, or edema Lymphadenopathy: No cervical adenopathy noted. Neurological: Alert and oriented to person place and time. Skin: Skin is warm and dry. No rashes noted. Psychiatric: Normal mood and affect. Behavior is normal.  CBC    Component Value Date/Time   WBC 5.5 05/25/2015 1900   RBC 5.09 05/25/2015 1900   HGB 14.8 05/25/2015 1900   HCT 44.8 05/25/2015 1900   PLT 255 05/25/2015 1900   MCV 88.0 05/25/2015 1900   MCH 29.1 05/25/2015 1900   MCHC 33.0 05/25/2015 1900   RDW 14.3 05/25/2015 1900   LYMPHSABS 2.7 12/04/2014 2240   MONOABS 0.4 12/04/2014 2240   EOSABS 0.3 12/04/2014 2240   BASOSABS 0.0 12/04/2014 2240       Assessment & Plan:   55 year old female with a history of diverticulosis with history of diverticulitis, adenomatous colon polyps, chronic constipation, chronic pain on chronic narcotics, MS and fibromyalgia who is here for follow-up.  1. Chronic constipation/IBS -- constipation has improved with Movantik. We will continue this medication 25 mg daily. I will have her add back her Colace at 200 mg daily at bedtime. Hopefully with the addition of stool softener she will have more complete evacuation and somewhat softer stools.  2. Rectal pressure -- likely a consequence ofChronic constipation as discussed in #1. She did have mild endoscopic inflammation seen in the distal rectum which also could contribute to rectal pressure. Trial of Canasa suppository 1 g daily at bedtime 8  weeks.  Follow-up in 6 months though I have asked that she call me in 2-4 weeks to let me know her response to Canasa. She voiced understanding 25 minutes spent with the patient today. Greater than 50% was spent in counseling and coordination of care with the patient

## 2016-01-01 NOTE — Patient Instructions (Addendum)
Continue Movantik.  Restart colace 200 mg every night.  We have sent the following medications to your pharmacy for you to pick up at your convenience: Canasa suppositories- 1 suppository in rectum every night x 8 weeks (we have given you a few samples of this as well)  If you are age 55 or older, your body mass index should be between 23-30. Your Body mass index is 18.67 kg/(m^2). If this is out of the aforementioned range listed, please consider follow up with your Primary Care Provider.  If you are age 36 or younger, your body mass index should be between 19-25. Your Body mass index is 18.67 kg/(m^2). If this is out of the aformentioned range listed, please consider follow up with your Primary Care Provider.

## 2016-01-24 ENCOUNTER — Other Ambulatory Visit: Payer: Self-pay | Admitting: Internal Medicine

## 2016-02-27 ENCOUNTER — Other Ambulatory Visit: Payer: Self-pay | Admitting: Obstetrics and Gynecology

## 2016-02-27 DIAGNOSIS — R928 Other abnormal and inconclusive findings on diagnostic imaging of breast: Secondary | ICD-10-CM

## 2016-02-29 ENCOUNTER — Ambulatory Visit
Admission: RE | Admit: 2016-02-29 | Discharge: 2016-02-29 | Disposition: A | Discharge status: Home / Self Care | Payer: Medicare Other | Source: Ambulatory Visit | Attending: Obstetrics and Gynecology | Admitting: Obstetrics and Gynecology

## 2016-02-29 DIAGNOSIS — R928 Other abnormal and inconclusive findings on diagnostic imaging of breast: Secondary | ICD-10-CM

## 2016-06-24 ENCOUNTER — Other Ambulatory Visit: Payer: Self-pay | Admitting: Internal Medicine

## 2016-06-24 ENCOUNTER — Telehealth: Payer: Self-pay | Admitting: Internal Medicine

## 2016-06-24 NOTE — Telephone Encounter (Signed)
Patient advised that we are glad to refill her medication but she needs to schedule a follow up office visit as requested at her visit on 01-01-16. Patient states that she will call back to schedule an appointment.

## 2016-07-02 ENCOUNTER — Other Ambulatory Visit: Payer: Self-pay | Admitting: Internal Medicine

## 2016-07-03 ENCOUNTER — Other Ambulatory Visit: Payer: Self-pay | Admitting: Internal Medicine

## 2016-07-03 MED ORDER — DICYCLOMINE HCL 20 MG PO TABS: ORAL_TABLET | ORAL | 0 refills | Status: DC

## 2016-07-03 NOTE — Telephone Encounter (Signed)
Prescription sent to [facility] per patient's request and informed to schedule a follow up visit. Patient verbalized understanding.

## 2016-07-29 ENCOUNTER — Other Ambulatory Visit: Payer: Self-pay | Admitting: Internal Medicine

## 2016-07-31 ENCOUNTER — Other Ambulatory Visit: Payer: Self-pay | Admitting: Internal Medicine

## 2016-08-07 ENCOUNTER — Telehealth: Payer: Self-pay | Admitting: Internal Medicine

## 2016-08-07 NOTE — Telephone Encounter (Signed)
[email or web address] prior auth initiated

## 2016-08-08 NOTE — Telephone Encounter (Signed)
Movantik has been approved through [employer] Part D through 07/07/17. [id number].

## 2016-08-19 ENCOUNTER — Other Ambulatory Visit: Payer: Self-pay | Admitting: Internal Medicine

## 2016-08-30 ENCOUNTER — Telehealth: Payer: Self-pay | Admitting: Internal Medicine

## 2016-08-30 ENCOUNTER — Ambulatory Visit: Payer: Medicare Other | Admitting: Internal Medicine

## 2016-08-30 ENCOUNTER — Other Ambulatory Visit: Payer: Self-pay | Admitting: Internal Medicine

## 2016-08-30 MED ORDER — OMEPRAZOLE 40 MG PO CPDR: DELAYED_RELEASE_CAPSULE | ORAL | 0 refills | Status: DC

## 2016-08-30 NOTE — Telephone Encounter (Signed)
Limited rx sent

## 2016-09-12 ENCOUNTER — Other Ambulatory Visit: Payer: Self-pay | Admitting: Internal Medicine

## 2016-09-17 ENCOUNTER — Other Ambulatory Visit: Payer: Self-pay | Admitting: Internal Medicine

## 2016-10-03 ENCOUNTER — Other Ambulatory Visit: Payer: Self-pay | Admitting: Internal Medicine

## 2016-10-14 ENCOUNTER — Ambulatory Visit (INDEPENDENT_AMBULATORY_CARE_PROVIDER_SITE_OTHER): Payer: Medicare Other | Admitting: Internal Medicine

## 2016-10-14 ENCOUNTER — Encounter: Payer: Self-pay | Admitting: Internal Medicine

## 2016-10-14 VITALS — BP 100/62 | HR 84 | Ht 62.5 in | Wt 94.8 lb

## 2016-10-14 DIAGNOSIS — K581 Irritable bowel syndrome with constipation: Secondary | ICD-10-CM | POA: Diagnosis not present

## 2016-10-14 DIAGNOSIS — K5909 Other constipation: Secondary | ICD-10-CM

## 2016-10-14 DIAGNOSIS — K219 Gastro-esophageal reflux disease without esophagitis: Secondary | ICD-10-CM

## 2016-10-14 DIAGNOSIS — Z86010 Personal history of colonic polyps: Secondary | ICD-10-CM | POA: Diagnosis not present

## 2016-10-14 MED ORDER — RANITIDINE HCL 150 MG PO TABS: 150.0000 mg | ORAL_TABLET | Freq: Two times a day (BID) | ORAL | 3 refills | Status: DC | PRN

## 2016-10-14 NOTE — Patient Instructions (Signed)
Discontinue Prilosec.  We have sent the following medications to your pharmacy for you to pick up at your convenience: Ranitidine 150 mg twice daily as needed. You may need daily while discontinuing prilosec.  Continue movantik and bentyl.  Follow up in 1 year, sooner if needed.  If you are age 56 or older, your body mass index should be between 23-30. Your Body mass index is 17.06 kg/m. If this is out of the aforementioned range listed, please consider follow up with your Primary Care Provider.  If you are age 83 or younger, your body mass index should be between 19-25. Your Body mass index is 17.06 kg/m. If this is out of the aformentioned range listed, please consider follow up with your Primary Care Provider.

## 2016-10-15 NOTE — Progress Notes (Signed)
   Subjective:    Patient ID: Sierra Morris, female    DOB: 01-25-1961, 56 y.o.   MRN: [id number]  HPI Sierra Morris is a 56 year old female with history of diverticulosis with history of diverticulitis, adenomatous colon polyps, chronic constipation, chronic pain on chronic narcotics, GERD, MS and fibromyalgia is here for follow-up. She was last seen in June 2017. She has been maintained on Movantik 25 mg daily for constipation. She also uses Bentyl 20 mg every 6 hours as needed for abdominal pain and spasm. She takes omeprazole 40 mg daily for GERD and indigestion symptoms.  Movantik had been working well for her but about a month ago she changed her multiple sclerosis medication via her neurologist to Pyote from glatiramer.  With this medication she developed abdominal pain, spasm, diarrhea and headaches. She spoke to her neurologist Dr. Rana Snare and they made the decision to discontinue the new medication and go back to glatiramer.  Since stopping Aubagio her diarrhea has improved that she has still had more abdominal cramping than usual. The Bentyl is helping with her abdominal pain. She plans to restart Movantik very soon. She denies blood in her stool or melena.  She wishes to discuss probably sec. She is not having heartburn, dysphagia or odynophagia. She is concerned about possible side effects of this medication. She does have a history of osteoporosis.  Review of Systems As per history of present illness, otherwise negative  Current Medications, Allergies, Past Medical History, Past Surgical History, Family History and Social History were reviewed in [employer] record.     Objective:   Physical Exam BP 100/62   Pulse 84   Ht 5' 2.5" (1.588 m)   Wt 94 lb 12.8 oz (43 kg)   BMI 17.06 kg/m  Constitutional: Well-developed and well-nourished. No distress. HEENT: Normocephalic and atraumatic. Oropharynx is clear and moist. Conjunctivae are normal.  No  scleral icterus. Neck: Neck supple. Trachea midline. Cardiovascular: Normal rate, regular rhythm and intact distal pulses. No M/R/G Pulmonary/chest: Effort normal and breath sounds normal. No wheezing, rales or rhonchi. Abdominal: Soft, nontender, nondistended. Bowel sounds active throughout. There are no masses palpable. No hepatosplenomegaly. Extremities: no clubbing, cyanosis, or edema Neurological: Alert and oriented to person place and time. Skin: Skin is warm and dry. Psychiatric: Normal mood and affect. Behavior is normal.     Assessment & Plan:  56 year old female with history of diverticulosis with history of diverticulitis, adenomatous colon polyps, chronic constipation, chronic pain on chronic narcotics, GERD, MS and fibromyalgia is here for follow-up.  1. IBS with chronic constipation -- bowel habits altered with recent change in multiple sclerosis medication. She has gone back to her previously tolerated medication. I expect her IBS with chronic constipation to persist. We will continue Movantik 25 mg daily. Her constipation is exacerbated by chronic narcotic medication use. She can continue Bentyl 20 mg 4 times a day when necessary  2. GERD -- no alarm symptom. Will stop omeprazole. She can use ranitidine 150 mg twice a day on an as-needed basis for heartburn and indigestion. I suggested she use it at least daily for the first week after discontinuation of omeprazole as there may be some rebound hyper acid secretion with PPI discontinuation. If she does not tolerate this change she is asked to notify me.  3. History of adenomatous colon polyp -- surveillance colonoscopy due April 2021  One-year follow-up, sooner if necessary

## 2016-10-20 ENCOUNTER — Other Ambulatory Visit: Payer: Self-pay | Admitting: Internal Medicine

## 2016-10-28 ENCOUNTER — Other Ambulatory Visit (HOSPITAL_COMMUNITY): Payer: Self-pay | Admitting: *Deleted

## 2016-10-29 ENCOUNTER — Ambulatory Visit (HOSPITAL_COMMUNITY)
Admission: RE | Admit: 2016-10-29 | Discharge: 2016-10-29 | Disposition: A | Discharge status: Home / Self Care | Payer: Medicare Other | Source: Ambulatory Visit | Attending: Obstetrics and Gynecology | Admitting: Obstetrics and Gynecology

## 2016-10-29 DIAGNOSIS — M810 Age-related osteoporosis without current pathological fracture: Secondary | ICD-10-CM | POA: Diagnosis present

## 2016-10-29 MED ORDER — ZOLEDRONIC ACID 5 MG/100ML IV SOLN
INTRAVENOUS | Status: AC
  Filled 2016-10-29: qty 100

## 2016-10-29 MED ORDER — ZOLEDRONIC ACID 5 MG/100ML IV SOLN
5.0000 mg | Freq: Once | INTRAVENOUS | Status: AC
  Administered 2016-10-29: 5 mg via INTRAVENOUS

## 2016-11-10 ENCOUNTER — Other Ambulatory Visit: Payer: Self-pay | Admitting: Internal Medicine

## 2016-11-19 ENCOUNTER — Telehealth: Payer: Self-pay | Admitting: Internal Medicine

## 2016-11-19 MED ORDER — DICYCLOMINE HCL 20 MG PO TABS: ORAL_TABLET | ORAL | 0 refills | Status: DC

## 2016-11-19 NOTE — Telephone Encounter (Signed)
Last rx was written correctly but we sent only 5 tablets in error. I have correct rx and [facility] #[facility].

## 2016-11-24 IMAGING — CT CT ABD-PELV W/ CM
2 of 5 series · 16 of 46 positions shown, 18 images · IV contrast (APPLIED)
Comparison: CT of the abdomen and pelvis from 11/22/2014

CLINICAL DATA: Acute onset of nausea and vomiting. Headache and
left lower quadrant abdominal pain. Initial encounter.

EXAM:
CT ABDOMEN AND PELVIS WITH CONTRAST
TECHNIQUE: Multidetector CT imaging of the abdomen and pelvis was performed
using the standard protocol following bolus administration of
intravenous contrast.
CONTRAST:  80mL OMNIPAQUE IOHEXOL 300 MG/ML  SOLN

[Series 2: abd/ pelvis 5.0 i30f 1 · axial · 0.63mm/px · z∈[-461,-81]mm · 13 of 86 slices shown, 15 images]
[im 5/86  soft-tissue]
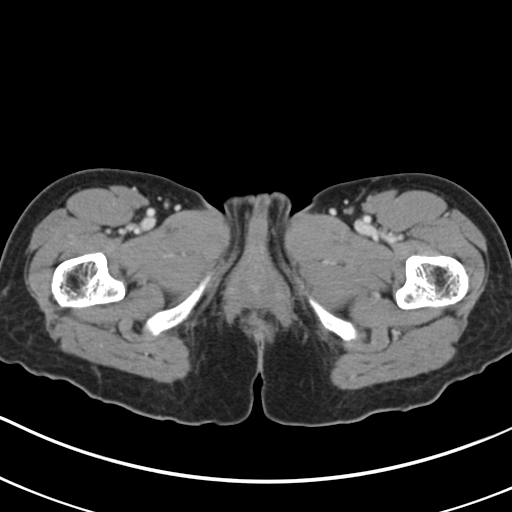
[im 5/86  bone]
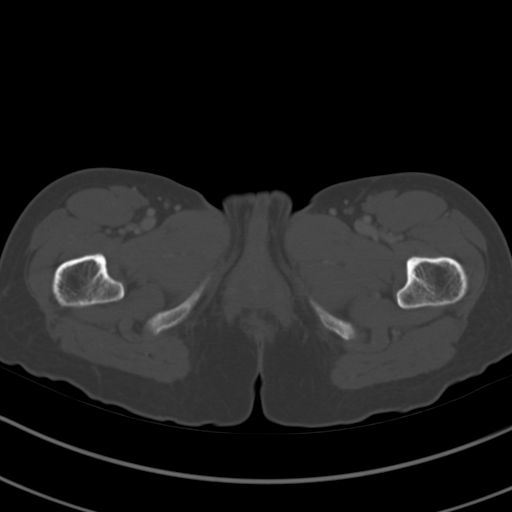
[im 14/86  soft-tissue]
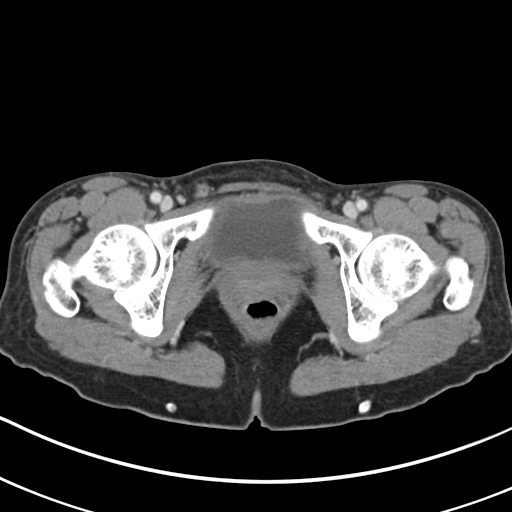
[im 18/86  soft-tissue]
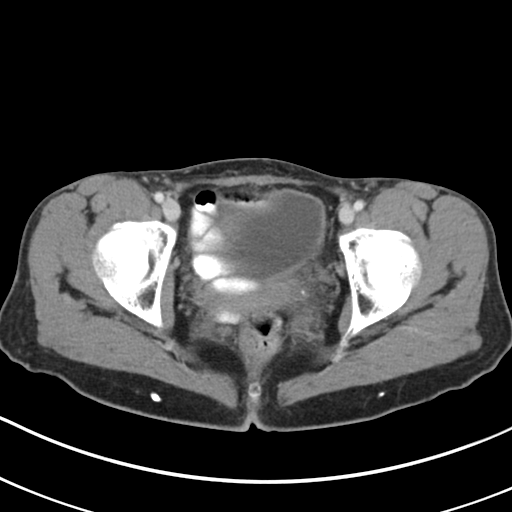
[im 23/86  soft-tissue]
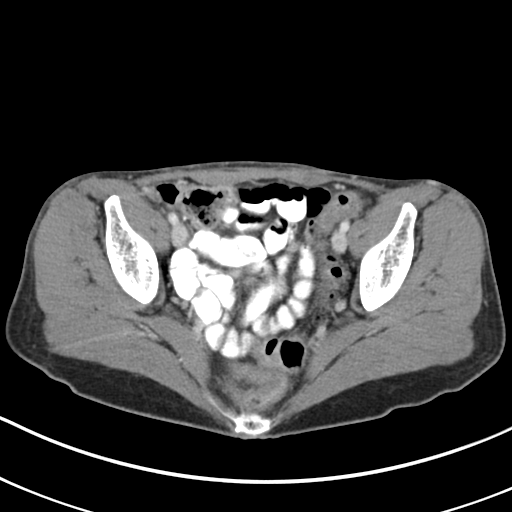
[im 32/86  soft-tissue]
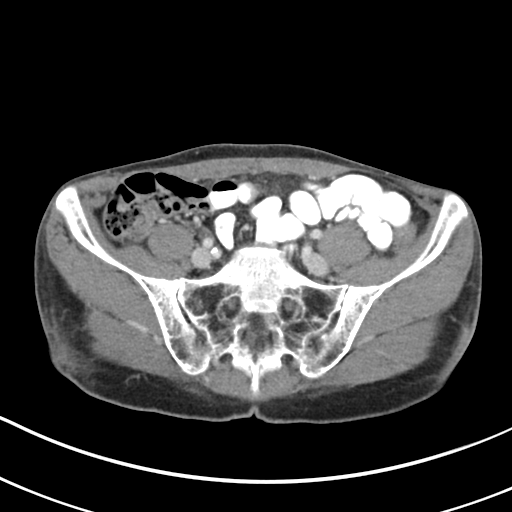
[im 36/86  soft-tissue]
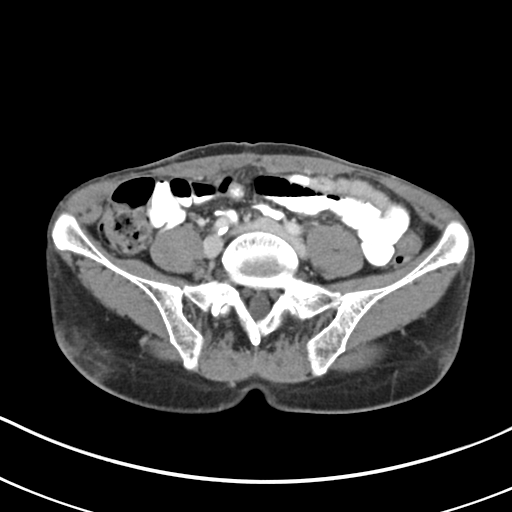
[im 45/86  soft-tissue]
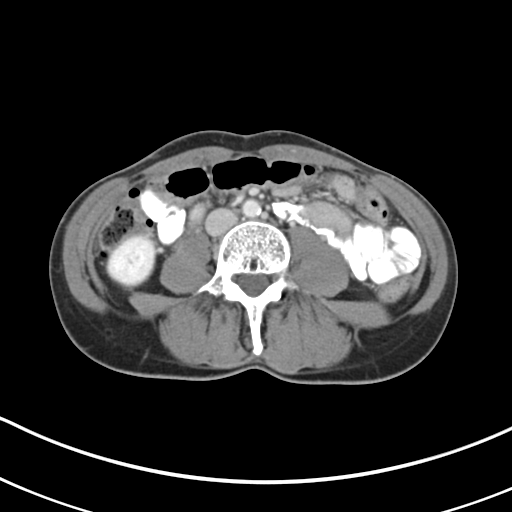
[im 50/86  soft-tissue]
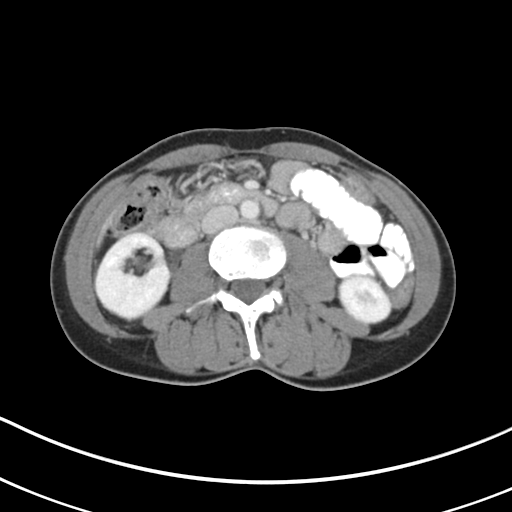
[im 54/86  soft-tissue]
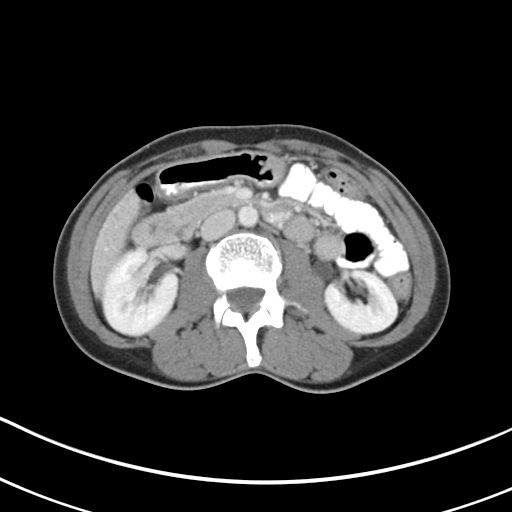
[im 54/86  bone]
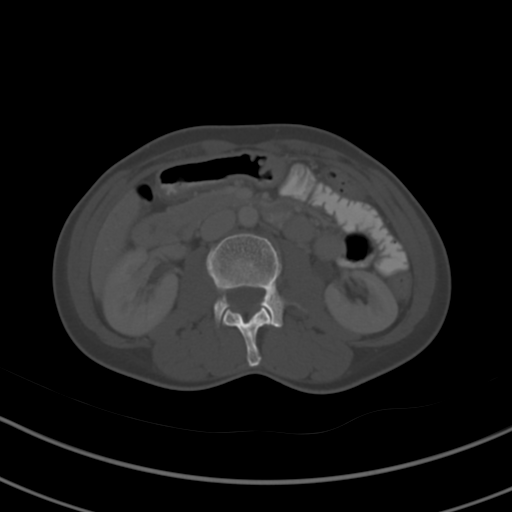
[im 63/86  soft-tissue]
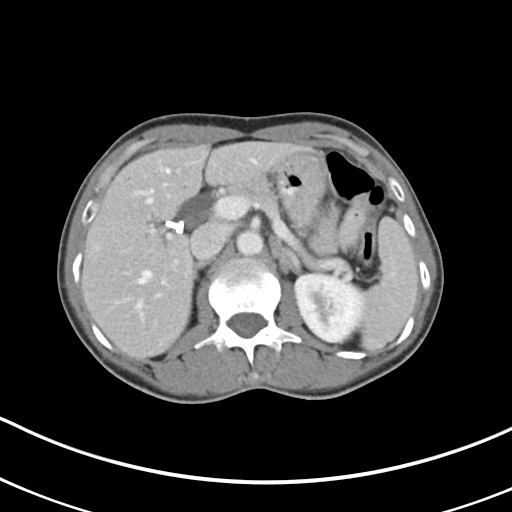
[im 68/86  soft-tissue]
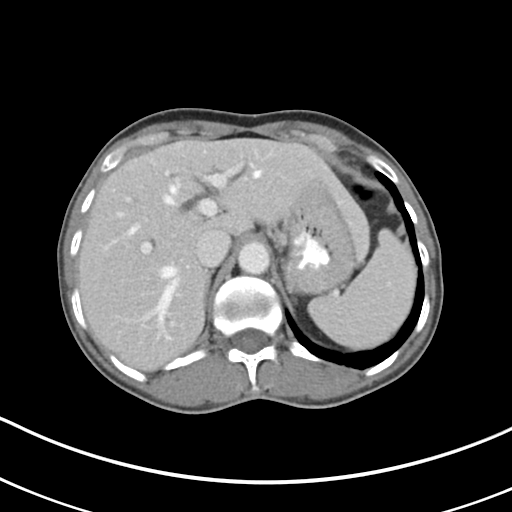
[im 72/86  soft-tissue]
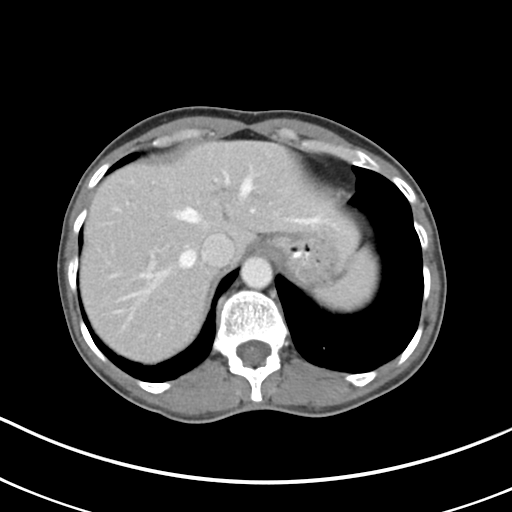
[im 81/86  soft-tissue]
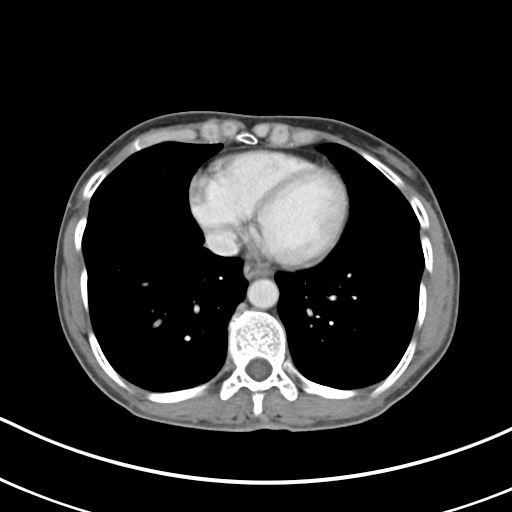

[Series 5: coronal soft tissue · coronal · 0.63mm/px · 3 of 67 slices shown]
[im 23/67  soft-tissue]
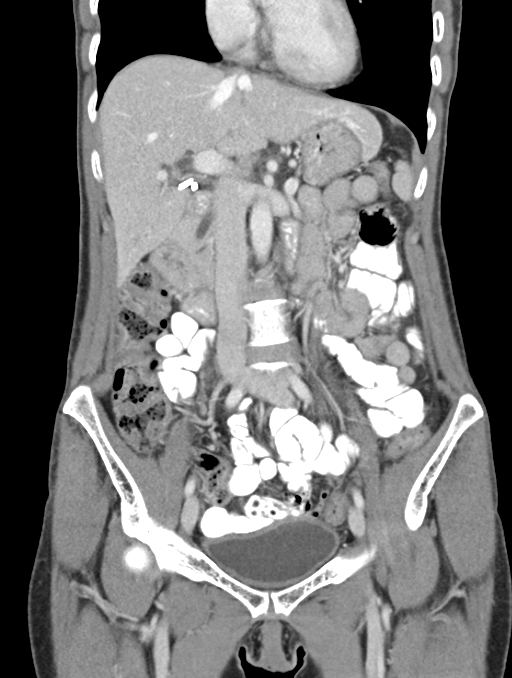
[im 30/67  soft-tissue]
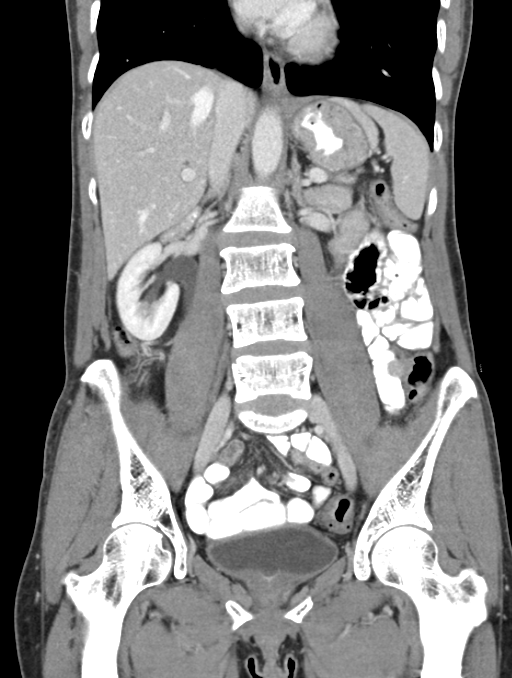
[im 37/67  soft-tissue]
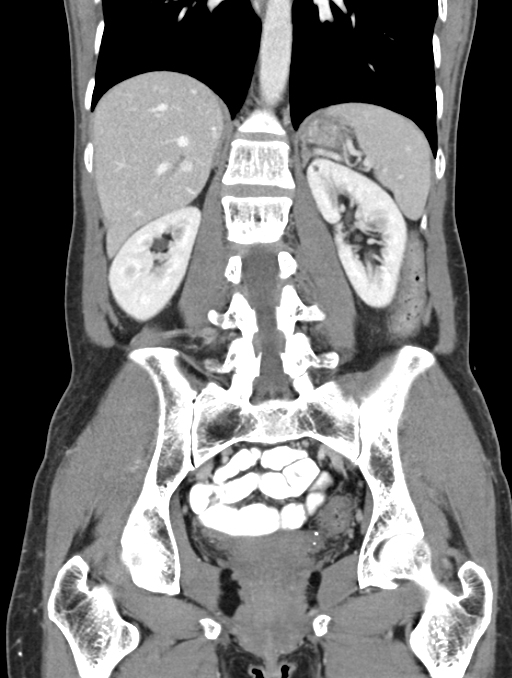

[16 of 46 positions shown; findings below may reference images not displayed]

FINDINGS: Minimal bibasilar atelectasis is noted.

The liver and spleen are unremarkable in appearance. The patient is
status post cholecystectomy, with clips noted at the gallbladder
fossa. The pancreas and adrenal glands are unremarkable.

The kidneys are unremarkable in appearance. There is no evidence of
hydronephrosis. No renal or ureteral stones are seen. No perinephric
stranding is appreciated.

No free fluid is identified. The small bowel is unremarkable in
appearance. The stomach is within normal limits. No acute vascular
abnormalities are seen. Scattered calcification is noted along the
abdominal aorta and its branches.

The patient is status post appendectomy. The colon is unremarkable
in appearance.

The bladder is mildly distended and grossly unremarkable. The
patient is status post hysterectomy. No suspicious adnexal masses
are seen. No inguinal lymphadenopathy is seen.

No acute osseous abnormalities are identified.
IMPRESSION: 1. No acute abnormality seen within the abdomen or pelvis.
2. Scattered calcification along the abdominal aorta and its
branches.

## 2017-01-06 ENCOUNTER — Other Ambulatory Visit: Payer: Self-pay | Admitting: Internal Medicine

## 2017-02-24 ENCOUNTER — Telehealth: Payer: Self-pay | Admitting: Internal Medicine

## 2017-02-24 NOTE — Telephone Encounter (Signed)
Pt scheduled to see Amy Esterwood PA 02/26/17@2pm . Pt aware of appt.

## 2017-02-26 ENCOUNTER — Ambulatory Visit (INDEPENDENT_AMBULATORY_CARE_PROVIDER_SITE_OTHER): Payer: Medicare Other | Admitting: Physician Assistant

## 2017-02-26 ENCOUNTER — Ambulatory Visit (INDEPENDENT_AMBULATORY_CARE_PROVIDER_SITE_OTHER)
Admission: RE | Admit: 2017-02-26 | Discharge: 2017-02-26 | Disposition: A | Discharge status: Home / Self Care | Payer: Medicare Other | Source: Ambulatory Visit | Attending: Physician Assistant | Admitting: Physician Assistant

## 2017-02-26 ENCOUNTER — Encounter: Payer: Self-pay | Admitting: Physician Assistant

## 2017-02-26 ENCOUNTER — Other Ambulatory Visit (INDEPENDENT_AMBULATORY_CARE_PROVIDER_SITE_OTHER): Payer: Medicare Other

## 2017-02-26 VITALS — BP 100/60 | HR 100 | Ht 60.25 in | Wt 101.1 lb

## 2017-02-26 DIAGNOSIS — R1084 Generalized abdominal pain: Secondary | ICD-10-CM

## 2017-02-26 DIAGNOSIS — R935 Abnormal findings on diagnostic imaging of other abdominal regions, including retroperitoneum: Secondary | ICD-10-CM

## 2017-02-26 DIAGNOSIS — Z86010 Personal history of colonic polyps: Secondary | ICD-10-CM | POA: Diagnosis not present

## 2017-02-26 LAB — CBC WITH DIFFERENTIAL/PLATELET
Basophils Absolute: 0.1 10*3/uL (ref 0.0–0.1)
Basophils Relative: 0.9 % (ref 0.0–3.0)
EOS PCT: 2.5 % (ref 0.0–5.0)
Eosinophils Absolute: 0.2 10*3/uL (ref 0.0–0.7)
HEMATOCRIT: 44.8 % (ref 36.0–46.0)
HEMOGLOBIN: 14.6 g/dL (ref 12.0–15.0)
Lymphocytes Relative: 29.6 % (ref 12.0–46.0)
Lymphs Abs: 2 10*3/uL (ref 0.7–4.0)
MCHC: 32.6 g/dL (ref 30.0–36.0)
MCV: 90.2 fl (ref 78.0–100.0)
MONO ABS: 0.4 10*3/uL (ref 0.1–1.0)
MONOS PCT: 6.2 % (ref 3.0–12.0)
Neutro Abs: 4 10*3/uL (ref 1.4–7.7)
Neutrophils Relative %: 60.8 % (ref 43.0–77.0)
Platelets: 278 10*3/uL (ref 150.0–400.0)
RBC: 4.96 Mil/uL (ref 3.87–5.11)
RDW: 14.9 % (ref 11.5–15.5)
WBC: 6.7 10*3/uL (ref 4.0–10.5)

## 2017-02-26 LAB — LIPASE: Lipase: 17 U/L (ref 11.0–59.0)

## 2017-02-26 LAB — SEDIMENTATION RATE: Sed Rate: 5 mm/hr (ref 0–30)

## 2017-02-26 MED ORDER — OMEPRAZOLE 40 MG PO CPDR: DELAYED_RELEASE_CAPSULE | ORAL | 1 refills | Status: DC

## 2017-02-26 NOTE — Progress Notes (Addendum)
Subjective:    Patient ID: Sierra Morris, female    DOB: 01/26/1961, 56 y.o.   MRN: [id number]  HPI Sierra Morris is a pleasant 57 year old white female, known to Dr. Hilarie Fredrickson who comes in today with complaints of several week history of abdominal pain, and abnormal CT scan at time of recent ER visit. Patient has history of adenomatous colon polyps, last colonoscopy April 2016 with a 3 mm polyp removed from the ascending colon and noted to have mild sigmoid diverticulosis and question SCAD. Path on the polyp consistent with a tubular adenoma. She was advised to have 5 year interval follow-up. Patient also with a chronic pain syndrome, history of chronic narcotic use, MS and is status post cholecystectomy and hysterectomy/BSO. Patient says she hasn't been feeling well over the past couple of weeks and worse over the past week or so. She says she's been having a lot of grumbling in her abdomen. She stopped taking multiple Zantac, and has not been taking Bentyl on a regular basis. She has had worsening symptoms of rather generalized abdominal discomfort across the mid abdomen radiating into her back more so on the left over the past week. She says this is worse with sitting and standing and better with lying down. She feels bloated and swollen incision she can't lay on her sides because it feels like there is "a bowling ball" in her abdomen. She has been having fairly regular bowel movements every couple of days no melena or hematochezia. No documented fever but has had some chills. She's been feeling very fatigued and says she's not able to be off for more than 45 hours a day and has to lie down. She's not had any known injury to her back. She went to the emergency room at [facility] on 02/23/2017, CBC and CMET were unremarkable. CT of the abdomen and pelvis was done with contrast showing bibasilar atelectasis, mild prominence of the common hepatic duct, and common bile duct measuring 6 mm, there appears to  be pancreas divisum, and there is a localized area of enhancement in the sigmoid colon at the level of the left acetabulum measuring 1.6 x 1.5 cm, no associated bowel wall thickening or inflammatory change no bowel obstruction, major mesenteric vessels appear patent.  Review of Systems Pertinent positive and negative review of systems were noted in the above HPI section.  All other review of systems was otherwise negative.  Outpatient Encounter Prescriptions as of 02/26/2017  Medication Sig  . carisoprodol (SOMA) 350 MG tablet Take 350 mg by mouth 4 (four) times daily as needed for muscle spasms.  . ciprofloxacin (CIPRO) 500 MG tablet Take 1 tablet by mouth 2 (two) times daily.  . clonazePAM (KLONOPIN) 0.5 MG tablet Take 0.5 mg by mouth daily.   Marland Kitchen dicyclomine (BENTYL) 20 MG tablet TAKE 1 TABLET (20 MG TOTAL) BY MOUTH EVERY 6 (SIX) HOURS AS NEEDED FOR SPASMS.  Marland Kitchen estrogen-methylTESTOSTERone 0.625-1.25 MG tablet Take 1 tablet by mouth every other day.  Marland Kitchen FLUoxetine (PROZAC) 40 MG capsule Take 1 capsule by mouth daily.  Marland Kitchen gabapentin (NEURONTIN) 300 MG capsule Take 300 mg by mouth as needed (pain).   . Glatiramer Acetate 40 MG/ML SOSY Inject 1 each into the skin 3 (three) times a week.   Marland Kitchen HYDROcodone-acetaminophen (NORCO/VICODIN) 5-325 MG tablet Take 1-2 tablets by mouth every 6 (six) hours as needed for moderate pain or severe pain.  . hydrOXYzine (ATARAX/VISTARIL) 25 MG tablet Take 25 mg by mouth as needed for  itching.   . methylphenidate (RITALIN) 20 MG tablet Take 20 mg by mouth 2 (two) times daily.  Marland Kitchen MOVANTIK 25 MG TABS tablet TAKE 1 TABLET BY MOUTH EVERY DAY  . ondansetron (ZOFRAN-ODT) 8 MG disintegrating tablet Take 8 mg by mouth every 8 (eight) hours as needed for nausea.   . ranitidine (ZANTAC) 150 MG tablet Take 1 tablet (150 mg total) by mouth 2 (two) times daily as needed for heartburn. (Patient taking differently: Take 150 mg by mouth as needed for heartburn. )  . omeprazole (PRILOSEC)  40 MG capsule Take 1 tab twice daily for week then once daily every morning.  . [DISCONTINUED] dicyclomine (BENTYL) 20 MG tablet TAKE 1 TABLET (20 MG TOTAL) BY MOUTH EVERY 6 (SIX) HOURS AS NEEDED FOR SPASMS.  . [DISCONTINUED] FLUoxetine (PROZAC) 20 MG capsule Take 1 capsule by mouth 2 (two) times daily.   No facility-administered encounter medications on file as of 02/26/2017.    Allergies  Allergen Reactions  . Wheat Bran Shortness Of Breath  . Prednisone Other (See Comments)    Make pt feel crazy  . Aubagio [Teriflunomide]     Abdominal cramping, headaches, blurry vision and mouth sores  . Benadryl [Diphenhydramine] Itching    Leg cramps/spasms   . Paroxetine Hcl Other (See Comments)    Crying, depressed  . Pregabalin Itching  . Suboxone [Buprenorphine Hcl-Naloxone Hcl] Other (See Comments)    Hallucinations          . Miralax [Polyethylene Glycol] Rash   Patient Active Problem List   Diagnosis Date Noted  . Adenomatous colon polyp 02/07/2015  . Diverticulosis of colon without hemorrhage 02/07/2015  . Chronic constipation 02/07/2015  . Pelvic pain in female 11/30/2014   Social History   Social History  . Marital status: Single    Spouse name: N/A  . Number of children: 3  . Years of education: N/A   Occupational History  . disabled     Social History Main Topics  . Smoking status: Current Every Day Smoker    Packs/day: 0.25    Years: 0.50    Types: Cigarettes  . Smokeless tobacco: Never Used  . Alcohol use 0.0 oz/week     Comment: rare  . Drug use: No  . Sexual activity: Not on file   Other Topics Concern  . Not on file   Social History Narrative  . No narrative on file    [name]'s family history includes COPD in her father; Colon cancer in her paternal grandmother; Colon polyps in her father and mother; Depression in her father; Diabetes in her father and maternal grandmother; Hypertension in her mother; Irritable bowel syndrome in her unknown  relative; Pancreatitis in her son; Rectal cancer in her paternal grandmother.      Objective:    Vitals:   02/26/17 1351  BP: 100/60  Pulse: 100    Physical Exam well-developed white female in no acute distress, very thin, blood pressure 100/60, pulse 100 height 5 foot weight 101, BMI of 19.5. HEENT; nontraumatic normocephalic EOMI PERRLA sclera anicteric Cardiovascular; regular rate and rhythm with S1-S2 no murmur or gallop, Pulmonary ;clear bilaterally, Abdomen,; there is mild generalized tenderness, nonfocal bowel sounds are present, no double mass or hepatosplenomegaly, she does have some mild distention with palpable bowel loops, Rectal; exam not done, Extremities ;no clubbing cyanosis or edema skin warm and dry, Neuropsych; mood and affect appropriate       Assessment & Plan:   #56 year old white  female with 2-3 week history of rather generalized abdominal discomfort, worse over the past week associated with bloating. Patient is also had fatigue and chills. Labs done on 02/23/2017 reassuring. CT abdomen and pelvis same day showed no acute findings to explain her current symptoms, and no evidence of bowel obstruction. She does have a 1.5 cm enhancing focus in the sigmoid colon cannot rule out mass versus inflammatory focus though there was no surrounding wall thickening or evidence of diverticulitis. #2 status post cholecystectomy with 6 mm CBD minimal common hepatic duct dilation #3 probable pancreas divisum #4 multiple sclerosis #5 history of adenomatous colon polyps last colonoscopy April 2016 #6 fibromyalgia #7 status post hysterectomy BSO #8  chronic pain syndrome and chronic narcotic use  Plan; Patient has been taking ibuprofen regularly,, we will stop this. Increase Prilosec to 40 mg by mouth twice daily over the next 2-3 weeks Restart Bentyl 10 mg by mouth 3 times daily Sedimentation rate CRP, lipase and repeat CBC with differential We'll check plain abdominal films  today Patient will be scheduled for colonoscopy with Dr. Hilarie Fredrickson Procedure discussed in detail with patient including risks and benefits and she is agreeable to proceed.   Amy Genia Harold PA-C 02/26/2017   Cc: Myrlene Broker, MD  Addendum: Reviewed and agree with initial management. Pyrtle, Lajuan Lines, MD

## 2017-02-26 NOTE — Patient Instructions (Addendum)
Please go to the basement level to have your labs drawn and go to the Radiology department for an X-RAY.  Take Bentyl 10 mg by mouth 3 times daily. We have sent the following medications to your pharmacy for you to pick up at your convenience:[facility] [address], [place], [place].  Stop Ibuprofen and Aleve.  You have been scheduled for a colonoscopy. Please follow written instructions given to you at your visit today.  We have given you a prep. If you use inhalers (even only as needed), please bring them with you on the day of your procedure. Your physician has requested that you go to www.startemmi.com and enter the access code given to you at your visit today. This web site gives a general overview about your procedure. However, you should still follow specific instructions given to you by our office regarding your preparation for the procedure.

## 2017-02-27 ENCOUNTER — Ambulatory Visit (AMBULATORY_SURGERY_CENTER): Payer: Medicare Other | Admitting: Internal Medicine

## 2017-02-27 ENCOUNTER — Encounter: Payer: Self-pay | Admitting: Internal Medicine

## 2017-02-27 VITALS — BP 120/72 | HR 74 | Temp 99.1°F | Resp 17 | Ht 60.0 in | Wt 101.0 lb

## 2017-02-27 DIAGNOSIS — D122 Benign neoplasm of ascending colon: Secondary | ICD-10-CM

## 2017-02-27 DIAGNOSIS — R935 Abnormal findings on diagnostic imaging of other abdominal regions, including retroperitoneum: Secondary | ICD-10-CM | POA: Diagnosis not present

## 2017-02-27 MED ORDER — SODIUM CHLORIDE 0.9 % IV SOLN: 500.0000 mL | INTRAVENOUS | Status: DC

## 2017-02-27 MED ORDER — LINACLOTIDE 145 MCG PO CAPS: 145.0000 ug | ORAL_CAPSULE | Freq: Every day | ORAL | Status: DC

## 2017-02-27 NOTE — Op Note (Signed)
[facility] Patient Name: Sierra Morris Procedure Date: 02/27/2017 2:17 PM MRN: [id number] Endoscopist: Jerene Bears , MD Age: 56 Referring MD:  Date of Birth: 1961/05/25 Gender: Female Account #: [id number] Procedure:                Colonoscopy Indications:              Abnormal CT of the GI tract Medicines:                Monitored Anesthesia Care Procedure:                Pre-Anesthesia Assessment:                           - Prior to the procedure, a History and Physical                            was performed, and patient medications and                            allergies were reviewed. The patient's tolerance of                            previous anesthesia was also reviewed. The risks                            and benefits of the procedure and the sedation                            options and risks were discussed with the patient.                            All questions were answered, and informed consent                            was obtained. Prior Anticoagulants: The patient has                            taken no previous anticoagulant or antiplatelet                            agents. ASA Grade Assessment: II - A patient with                            mild systemic disease. After reviewing the risks                            and benefits, the patient was deemed in                            satisfactory condition to undergo the procedure.                           After obtaining informed consent, the colonoscope  was passed under direct vision. Throughout the                            procedure, the patient's blood pressure, pulse, and                            oxygen saturations were monitored continuously. The                            Endoscope was introduced through the anus and                            advanced to the the cecum, identified by                            appendiceal orifice and ileocecal valve. The                           colonoscopy was performed without difficulty. The                            colonoscopy was performed without difficulty. The                            patient tolerated the procedure well. The quality                            of the bowel preparation was good. The ileocecal                            valve, appendiceal orifice, and rectum were                            photographed. Scope In: 2:31:36 PM Scope Out: 2:52:47 PM Scope Withdrawal Time: 0 hours 17 minutes 30 seconds  Total Procedure Duration: 0 hours 21 minutes 11 seconds  Findings:                 The digital rectal exam was normal.                           A 3 mm polyp was found in the ascending colon. The                            polyp was sessile. The polyp was removed with a                            cold biopsy forceps. Resection and retrieval were                            complete.                           A few small-mouthed diverticula were found in the  sigmoid colon and hepatic flexure.                           The retroflexed view of the distal rectum and anal                            verge was normal and showed no anal or rectal                            abnormalities. Complications:            No immediate complications. Estimated Blood Loss:     Estimated blood loss: none. Impression:               - One 3 mm polyp in the ascending colon, removed                            with a cold biopsy forceps. Resected and retrieved.                           - Mild diverticulosis in the sigmoid colon and at                            the hepatic flexure.                           - The distal rectum and anal verge are normal on                            retroflexion view.                           - No evidence of sigmoid abnormality. Recommendation:           - Patient has a contact number available for                            emergencies. The signs  and symptoms of potential                            delayed complications were discussed with the                            patient. Return to normal activities tomorrow.                            Written discharge instructions were provided to the                            patient.                           - Resume previous diet.                           - Continue present medications.                           -  Await pathology results.                           - Repeat colonoscopy is recommended for                            surveillance. The colonoscopy date will be                            determined after pathology results from today's                            exam become available for review. Jerene Bears, MD 02/27/2017 3:03:02 PM This report has been signed electronically.

## 2017-02-27 NOTE — Progress Notes (Signed)
Report given to PACU, vss 

## 2017-02-27 NOTE — Patient Instructions (Addendum)
YOU HAD AN ENDOSCOPIC PROCEDURE TODAY AT [facility]:   Refer to the procedure report that was given to you for any specific questions about what was found during the examination.  If the procedure report does not answer your questions, please call your gastroenterologist to clarify.  If you requested that your care partner not be given the details of your procedure findings, then the procedure report has been included in a sealed envelope for you to review at your convenience later.  YOU SHOULD EXPECT: Some feelings of bloating in the abdomen. Passage of more gas than usual.  Walking can help get rid of the air that was put into your GI tract during the procedure and reduce the bloating. If you had a lower endoscopy (such as a colonoscopy or flexible sigmoidoscopy) you may notice spotting of blood in your stool or on the toilet paper. If you underwent a bowel prep for your procedure, you may not have a normal bowel movement for a few days.  Please Note:  You might notice some irritation and congestion in your nose or some drainage.  This is from the oxygen used during your procedure.  There is no need for concern and it should clear up in a day or so.  SYMPTOMS TO REPORT IMMEDIATELY:   Following lower endoscopy (colonoscopy or flexible sigmoidoscopy):  Excessive amounts of blood in the stool  Significant tenderness or worsening of abdominal pains  Swelling of the abdomen that is new, acute  Fever of 100F or higher  For urgent or emergent issues, a gastroenterologist can be reached at any hour by calling [phone].   DIET:  We do recommend a small meal at first, but then you may proceed to your regular diet.  Drink plenty of fluids but you should avoid alcoholic beverages for 24 hours.  ACTIVITY:  You should plan to take it easy for the rest of today and you should NOT DRIVE or use heavy machinery until tomorrow (because of the sedation medicines used during the test).     FOLLOW UP: Our staff will call the number listed on your records the next business day following your procedure to check on you and address any questions or concerns that you may have regarding the information given to you following your procedure. If we do not reach you, we will leave a message.  However, if you are feeling well and you are not experiencing any problems, there is no need to return our call.  We will assume that you have returned to your regular daily activities without incident.  If any biopsies were taken you will be contacted by phone or by letter within the next 1-3 weeks.  Please call us at [phone] if you have not heard about the biopsies in 3 weeks.    SIGNATURES/CONFIDENTIALITY: You and/or your care partner have signed paperwork which will be entered into your electronic medical record.  These signatures attest to the fact that that the information above on your After Visit Summary has been reviewed and is understood.  Full responsibility of the confidentiality of this discharge information lies with you and/or your care-partner.  Read all handouts given to you by your recovery room nurse.  Take your linzess as ordered

## 2017-02-27 NOTE — Progress Notes (Signed)
Pt's states no medical or surgical changes since previsit or office visit. 

## 2017-02-27 NOTE — Progress Notes (Signed)
Called to room to assist during endoscopic procedure.  Patient ID and intended procedure confirmed with present staff. Received instructions for my participation in the procedure from the performing physician.  

## 2017-02-28 ENCOUNTER — Telehealth: Payer: Self-pay | Admitting: *Deleted

## 2017-02-28 NOTE — Telephone Encounter (Signed)
  Follow up Call-  Call back number 02/27/2017 10/12/2014  Post procedure Call Back phone  # [phone]  Permission to leave phone message No Yes  Some recent data might be hidden     Patient questions:  Do you have a fever, pain , or abdominal swelling? Yes.   Pain Score  0 *  Have you tolerated food without any problems? YES  Have you been able to return to your normal activities? YES  Do you have any questions about your discharge instructions: Diet   No. Medications  No. Follow up visit  No.  Do you have questions or concerns about your Care? No.  Actions: * If pain score is 4 or above: No action needed, pain <4.  Patient states she has "soreness and swelling in abdomen", no severe, patient states she believes it to be left over air. Encouraged patient to walk a little and warm fluids to help, also to call us back if it worsens or not improved today. She verbalizes understanding.

## 2017-03-03 ENCOUNTER — Telehealth: Payer: Self-pay | Admitting: Internal Medicine

## 2017-03-03 ENCOUNTER — Encounter: Payer: Self-pay | Admitting: Internal Medicine

## 2017-03-03 MED ORDER — LINACLOTIDE 145 MCG PO CAPS: 145.0000 ug | ORAL_CAPSULE | Freq: Every day | ORAL | 1 refills | Status: DC

## 2017-03-03 NOTE — Telephone Encounter (Signed)
Rx sent 

## 2017-03-03 NOTE — Telephone Encounter (Signed)
Dr Hilarie Fredrickson- Did you tell patient we would give Linzess? I see no mention anywhere in the chart of this and the last thing I see she is on is Movantik. Please advise.Marland KitchenMarland Kitchen

## 2017-03-03 NOTE — Telephone Encounter (Signed)
Start with 145 mcg daily and okay to increase to 290 g after 1 week if ineffective and or she remains constipated

## 2017-03-03 NOTE — Telephone Encounter (Signed)
I did begin Linzess after her recent colonoscopy She was previously on Movantik but is now off of chronic narcotics therefore Movantik has no role in her chronic constipation

## 2017-03-03 NOTE — Telephone Encounter (Signed)
Ok, I need to know what dosage you want her to be on.

## 2017-03-26 ENCOUNTER — Telehealth: Payer: Self-pay | Admitting: Internal Medicine

## 2017-03-26 NOTE — Telephone Encounter (Signed)
Spoke with pt and she states she is still having abdominal/stomach pain. Describes the pain as a gnawing discomfort all around her abdomen. Reports she is not sleeping well at night, the discomfort is waking her up. Pt scheduled to see Nicoletta Ba PA tomorrow at 1:30pm. Pt aware of appt.

## 2017-03-27 ENCOUNTER — Encounter: Payer: Self-pay | Admitting: Physician Assistant

## 2017-03-27 ENCOUNTER — Ambulatory Visit (INDEPENDENT_AMBULATORY_CARE_PROVIDER_SITE_OTHER): Payer: Medicare Other | Admitting: Physician Assistant

## 2017-03-27 ENCOUNTER — Other Ambulatory Visit (INDEPENDENT_AMBULATORY_CARE_PROVIDER_SITE_OTHER): Payer: Medicare Other

## 2017-03-27 ENCOUNTER — Ambulatory Visit (INDEPENDENT_AMBULATORY_CARE_PROVIDER_SITE_OTHER)
Admission: RE | Admit: 2017-03-27 | Discharge: 2017-03-27 | Disposition: A | Discharge status: Home / Self Care | Payer: Medicare Other | Source: Ambulatory Visit | Attending: Physician Assistant | Admitting: Physician Assistant

## 2017-03-27 VITALS — BP 98/70 | HR 72 | Ht 60.0 in | Wt 100.4 lb

## 2017-03-27 DIAGNOSIS — R935 Abnormal findings on diagnostic imaging of other abdominal regions, including retroperitoneum: Secondary | ICD-10-CM | POA: Diagnosis not present

## 2017-03-27 DIAGNOSIS — R110 Nausea: Secondary | ICD-10-CM | POA: Diagnosis not present

## 2017-03-27 DIAGNOSIS — M549 Dorsalgia, unspecified: Secondary | ICD-10-CM | POA: Diagnosis not present

## 2017-03-27 DIAGNOSIS — Z86010 Personal history of colonic polyps: Secondary | ICD-10-CM | POA: Diagnosis not present

## 2017-03-27 DIAGNOSIS — R1084 Generalized abdominal pain: Secondary | ICD-10-CM

## 2017-03-27 LAB — CBC WITH DIFFERENTIAL/PLATELET
BASOS PCT: 0.3 % (ref 0.0–3.0)
Basophils Absolute: 0 10*3/uL (ref 0.0–0.1)
Eosinophils Absolute: 0.2 10*3/uL (ref 0.0–0.7)
Eosinophils Relative: 3.1 % (ref 0.0–5.0)
HEMATOCRIT: 44.2 % (ref 36.0–46.0)
Hemoglobin: 14.6 g/dL (ref 12.0–15.0)
LYMPHS PCT: 25.8 % (ref 12.0–46.0)
Lymphs Abs: 1.8 10*3/uL (ref 0.7–4.0)
MCHC: 33 g/dL (ref 30.0–36.0)
MCV: 89.8 fl (ref 78.0–100.0)
MONOS PCT: 7.5 % (ref 3.0–12.0)
Monocytes Absolute: 0.5 10*3/uL (ref 0.1–1.0)
NEUTROS ABS: 4.5 10*3/uL (ref 1.4–7.7)
NEUTROS PCT: 63.3 % (ref 43.0–77.0)
PLATELETS: 260 10*3/uL (ref 150.0–400.0)
RBC: 4.92 Mil/uL (ref 3.87–5.11)
RDW: 14.6 % (ref 11.5–15.5)
WBC: 7 10*3/uL (ref 4.0–10.5)

## 2017-03-27 LAB — COMPREHENSIVE METABOLIC PANEL
ALK PHOS: 60 U/L (ref 39–117)
ALT: 9 U/L (ref 0–35)
AST: 15 U/L (ref 0–37)
Albumin: 4.6 g/dL (ref 3.5–5.2)
BUN: 13 mg/dL (ref 6–23)
CALCIUM: 10 mg/dL (ref 8.4–10.5)
CO2: 30 meq/L (ref 19–32)
Chloride: 102 mEq/L (ref 96–112)
Creatinine, Ser: 0.81 mg/dL (ref 0.40–1.20)
GFR: 77.58 mL/min (ref >60.00)
GLUCOSE: 88 mg/dL (ref 70–99)
Potassium: 4.3 mEq/L (ref 3.5–5.1)
Sodium: 138 mEq/L (ref 135–145)
Total Bilirubin: 0.3 mg/dL (ref 0.2–1.2)
Total Protein: 7.2 g/dL (ref 6.0–8.3)

## 2017-03-27 LAB — HIGH SENSITIVITY CRP: CRP, High Sensitivity: 2.42 mg/L (ref 0.000–5.000)

## 2017-03-27 NOTE — Patient Instructions (Signed)
Please go to the basement level to have your labs drawn.  Your provider has requested that you have an abdominal x ray before leaving today. Please go to the basement floor to our Radiology department for the test.   MRI examination of the Abdomen/Pelvis is scheduled for Tuesday 04-01-2017 at 5:00 PM. Arrive at 4:45 PM.  Have nothing by mouth after 1:00 PM.  Location is [facility].   You have been scheduled for an endoscopy. Please follow written instructions given to you at your visit today. If you use inhalers (even only as needed), please bring them with you on the day of your procedure. Your physician has requested that you go to www.startemmi.com and enter the access code given to you at your visit today. This web site gives a general overview about your procedure. However, you should still follow specific instructions given to you by our office regarding your preparation for the procedure.

## 2017-03-28 NOTE — Progress Notes (Addendum)
Subjective:    Patient ID: Sierra Morris, female    DOB: [birth year], 56 y.o.   MRN: [id number]  HPI Sierra Morris is a 56 year old white female, known to Dr. Hilarie Fredrickson, who was last seen in the office by myself on 02/26/2017 She has history of diverticulosis, adenomatous colon polyps, constipation, MS, and is status post cholecystectomy and hysterectomy. She was seen with abdominal pain in August. She had described this is rather generalized and associated with bloating and fatigue. She had just had CT of the abdomen and pelvis which did not show any acute findings to explain her symptoms and no evidence of bowel obstruction. She had had a 1.5 cm enhancing focus in the sigmoid colon, could not rule out mass versus inflammatory focus We increased her Prilosec to 40 mg by mouth twice a day, started Bentyl 10 mg 3 times a day and scheduled her for a colonoscopy. She had this on a 8/23/ 2018 which showed a 3 mm polyp in the ascending colon which was sessile. This was removed in path consistent with tubular adenoma. She had some diverticuli in the sigmoid colon and hepatic flexure but no evidence of mass or other inflammatory process. She comes in today for follow-up stating that she is continuing to have ongoing abdominal pain and is exhausted because she is unable to sleep. She says when she is awake and sitting up her back is hurting all day long and when she lies down at night her abdominal pain increases and keeps her from sleeping. She describes this as a dull heavy feeling which has  become constant over the past 2 months. Her bowel movements have been fairly good, using Linzess 145, she has been eating okay, occasional episodes of vomiting and sometimes feels better after vomiting. She says at times she has vomited up what appears to be undigested food several hours after she has eaten.  Review of Systems Pertinent positive and negative review of systems were noted in the above HPI section.  All other review of  systems was otherwise negative.  Outpatient Encounter Prescriptions as of 03/27/2017  Medication Sig  . carisoprodol (SOMA) 350 MG tablet Take 350 mg by mouth 4 (four) times daily as needed for muscle spasms.  . clonazePAM (KLONOPIN) 0.5 MG tablet Take 0.5 mg by mouth daily.   Marland Kitchen dicyclomine (BENTYL) 20 MG tablet TAKE 1 TABLET (20 MG TOTAL) BY MOUTH EVERY 6 (SIX) HOURS AS NEEDED FOR SPASMS.  Marland Kitchen estrogen-methylTESTOSTERone 0.625-1.25 MG tablet Take 1 tablet by mouth every other day.  Marland Kitchen FLUoxetine (PROZAC) 40 MG capsule Take 1 capsule by mouth daily.  Marland Kitchen gabapentin (NEURONTIN) 300 MG capsule Take 300 mg by mouth as needed (pain).   . Glatiramer Acetate 40 MG/ML SOSY Inject 1 each into the skin 3 (three) times a week.   Marland Kitchen HYDROcodone-acetaminophen (NORCO/VICODIN) 5-325 MG tablet Take 1-2 tablets by mouth every 6 (six) hours as needed for moderate pain or severe pain.  . hydrOXYzine (ATARAX/VISTARIL) 25 MG tablet Take 25 mg by mouth as needed for itching.   . linaclotide (LINZESS) 145 MCG CAPS capsule Take 1 capsule (145 mcg total) by mouth daily before breakfast.  . methylphenidate (RITALIN) 20 MG tablet Take 20 mg by mouth 2 (two) times daily.  Marland Kitchen omeprazole (PRILOSEC) 40 MG capsule Take 1 tab twice daily for week then once daily every morning.  . ondansetron (ZOFRAN-ODT) 8 MG disintegrating tablet Take 8 mg by mouth every 8 (eight) hours as needed for nausea.   Marland Kitchen  ranitidine (ZANTAC) 150 MG tablet Take 1 tablet (150 mg total) by mouth 2 (two) times daily as needed for heartburn. (Patient taking differently: Take 150 mg by mouth as needed for heartburn. )  . [DISCONTINUED] ciprofloxacin (CIPRO) 500 MG tablet Take 1 tablet by mouth 2 (two) times daily.   Facility-Administered Encounter Medications as of 03/27/2017  Medication  . 0.9 %  sodium chloride infusion   Allergies  Allergen Reactions  . Wheat Bran Shortness Of Breath  . Prednisone Other (See Comments)    Make pt feel crazy  . Aubagio  [Teriflunomide]     Abdominal cramping, headaches, blurry vision and mouth sores  . Benadryl [Diphenhydramine] Itching    Leg cramps/spasms   . Paroxetine Hcl Other (See Comments)    Crying, depressed  . Pregabalin Itching  . Suboxone [Buprenorphine Hcl-Naloxone Hcl] Other (See Comments)    Hallucinations          . Miralax [Polyethylene Glycol] Rash   Patient Active Problem List   Diagnosis Date Noted  . Adenomatous colon polyp 02/07/2015  . Diverticulosis of colon without hemorrhage 02/07/2015  . Chronic constipation 02/07/2015  . Pelvic pain in female 11/30/2014   Social History   Social History  . Marital status: Single    Spouse name: N/A  . Number of children: 3  . Years of education: N/A   Occupational History  . disabled     Social History Main Topics  . Smoking status: Current Every Day Smoker    Packs/day: 0.25    Years: 0.50    Types: Cigarettes  . Smokeless tobacco: Never Used  . Alcohol use 0.0 oz/week     Comment: rare  . Drug use: No  . Sexual activity: Not on file   Other Topics Concern  . Not on file   Social History Narrative  . No narrative on file    Sierra Morris's family history includes COPD in her father; Colon cancer in her paternal grandmother; Colon polyps in her father and mother; Depression in her father; Diabetes in her father and maternal grandmother; Hypertension in her mother; Irritable bowel syndrome in her unknown relative; Pancreatitis in her son; Rectal cancer in her paternal grandmother.      Objective:    Vitals:   03/27/17 1335  BP: 98/70  Pulse: 72    Physical Exam well-developed white female in no acute distress, thin, Appears uncomfortable   Blood pressure 98/70 pulse 72, height 5 foot weight 100 BMI 19.6. HEENT nontraumatic normocephalic EOMI PERRLA sclera anicteric, Cardiovascular regular rate and rhythm with S1-S2 no murmur or gallop, Pulmonary clear bilaterally, Abdomen flat soft she has rather  generalized tenderness of the upper abdomen and a very prominent aortic pulsation, no bruits, no palpable mass or hepatosplenomegaly, Rectal exam not done, Remedies no clubbing cyanosis or edema skin warm and dry, Neuropsych mood and affect appropriate       Assessment & Plan:   #37 56 year old white female with MS, with 2 month history of now constant rather generalized abdominal pain, more localized to the upper abdomen, described as dull and heavy inconsistently worse at night with lying down. Pain seems to be improved by sitting up but then having back pain. She does not have any previous history of back disease. Etiology of her pain is unclear. She is clearly uncomfortable and not sleeping secondary to pain #2 constipation-stable on Linzess 145 g daily  #3History of adenomatous colon polyps-patient just had colonoscopy August 2018-will need 5  year interval follow-up #4 diverticulosis #5 status post cholecystectomy hysterectomy and BSO  Plan; Will check lumbar spine films, Schedule for upper endoscopy with Dr. Hilarie Fredrickson. Procedure discussed in detail with patient including risks and benefits and she is agreeable to proceed Schedule MRI abdomen. Continue Prilosec 40 mg twice daily Continue Linzess145 g daily She may stop Bentyl if not helpful.  Keiyon Plack S Caitland Porchia PA-C 03/28/2017   Cc: Myrlene Broker, MD   Addendum: Reviewed and agree with management. Pyrtle, Lajuan Lines, MD

## 2017-03-31 ENCOUNTER — Other Ambulatory Visit: Payer: Self-pay | Admitting: Physician Assistant

## 2017-03-31 DIAGNOSIS — R110 Nausea: Secondary | ICD-10-CM

## 2017-03-31 DIAGNOSIS — R1084 Generalized abdominal pain: Secondary | ICD-10-CM

## 2017-03-31 DIAGNOSIS — M549 Dorsalgia, unspecified: Secondary | ICD-10-CM

## 2017-04-01 ENCOUNTER — Ambulatory Visit (HOSPITAL_COMMUNITY): Admission: RE | Admit: 2017-04-01 | Payer: Medicare Other | Source: Ambulatory Visit

## 2017-04-01 LAB — ANA

## 2017-04-02 ENCOUNTER — Other Ambulatory Visit: Payer: Medicare Other

## 2017-04-02 DIAGNOSIS — R110 Nausea: Secondary | ICD-10-CM

## 2017-04-02 DIAGNOSIS — M549 Dorsalgia, unspecified: Secondary | ICD-10-CM

## 2017-04-02 DIAGNOSIS — R1084 Generalized abdominal pain: Secondary | ICD-10-CM

## 2017-05-05 ENCOUNTER — Encounter: Payer: Medicare Other | Admitting: Internal Medicine

## 2017-08-05 ENCOUNTER — Other Ambulatory Visit (HOSPITAL_COMMUNITY): Payer: Self-pay | Admitting: *Deleted

## 2017-08-06 ENCOUNTER — Ambulatory Visit (HOSPITAL_COMMUNITY)
Admission: RE | Admit: 2017-08-06 | Discharge: 2017-08-06 | Disposition: A | Discharge status: Home / Self Care | Payer: Medicare Other | Source: Ambulatory Visit | Attending: Obstetrics and Gynecology | Admitting: Obstetrics and Gynecology

## 2017-08-06 DIAGNOSIS — M810 Age-related osteoporosis without current pathological fracture: Secondary | ICD-10-CM | POA: Diagnosis present

## 2017-08-06 MED ORDER — ZOLEDRONIC ACID 5 MG/100ML IV SOLN
INTRAVENOUS | Status: AC
  Administered 2017-08-06: 5 mg
  Filled 2017-08-06: qty 100

## 2017-08-06 MED ORDER — ZOLEDRONIC ACID 5 MG/100ML IV SOLN
5.0000 mg | Freq: Once | INTRAVENOUS | Status: DC
  Filled 2017-08-06: qty 100

## 2017-11-18 DIAGNOSIS — M50122 Cervical disc disorder at C5-C6 level with radiculopathy: Secondary | ICD-10-CM | POA: Insufficient documentation

## 2018-01-12 ENCOUNTER — Telehealth: Payer: Self-pay | Admitting: Internal Medicine

## 2018-01-12 MED ORDER — LINACLOTIDE 145 MCG PO CAPS
145.0000 ug | ORAL_CAPSULE | Freq: Every day | ORAL | 0 refills | Status: DC
Start: 2018-01-12 — End: 2018-05-12

## 2018-01-12 NOTE — Telephone Encounter (Signed)
Rx sent. Needs office visit for further refills. 

## 2018-02-07 ENCOUNTER — Other Ambulatory Visit: Payer: Self-pay | Admitting: Internal Medicine

## 2018-03-08 ENCOUNTER — Other Ambulatory Visit: Payer: Self-pay | Admitting: Internal Medicine

## 2018-05-12 ENCOUNTER — Encounter: Payer: Self-pay | Admitting: Internal Medicine

## 2018-05-12 ENCOUNTER — Ambulatory Visit (INDEPENDENT_AMBULATORY_CARE_PROVIDER_SITE_OTHER): Payer: Medicare Other | Admitting: Internal Medicine

## 2018-05-12 VITALS — BP 122/78 | HR 76 | Ht 60.0 in | Wt 104.4 lb

## 2018-05-12 DIAGNOSIS — Z86010 Personal history of colonic polyps: Secondary | ICD-10-CM | POA: Diagnosis not present

## 2018-05-12 DIAGNOSIS — K5909 Other constipation: Secondary | ICD-10-CM | POA: Diagnosis not present

## 2018-05-12 DIAGNOSIS — R1310 Dysphagia, unspecified: Secondary | ICD-10-CM | POA: Diagnosis not present

## 2018-05-12 MED ORDER — LINACLOTIDE 145 MCG PO CAPS: 145.0000 ug | ORAL_CAPSULE | Freq: Every day | ORAL | 3 refills | Status: DC

## 2018-05-12 NOTE — Patient Instructions (Addendum)
You have been scheduled for a Barium Esophogram at [facility] (1st floor of the hospital) on Friday 05/29/18 at 11:00 am. Please arrive 15 minutes prior to your appointment for registration. Make certain not to have anything to eat or drink 3 hours prior to your test. If you need to reschedule for any reason, please contact radiology at [phone] to do so. __________________________________________________________________ A barium swallow is an examination that concentrates on views of the esophagus. This tends to be a double contrast exam (barium and two liquids which, when combined, create a gas to distend the wall of the oesophagus) or single contrast (non-ionic iodine based). The study is usually tailored to your symptoms so a good history is essential. Attention is paid during the study to the form, structure and configuration of the esophagus, looking for functional disorders (such as aspiration, dysphagia, achalasia, motility and reflux) EXAMINATION You may be asked to change into a gown, depending on the type of swallow being performed. A radiologist and radiographer will perform the procedure. The radiologist will advise you of the type of contrast selected for your procedure and direct you during the exam. You will be asked to stand, sit or lie in several different positions and to hold a small amount of fluid in your mouth before being asked to swallow while the imaging is performed .In some instances you may be asked to swallow barium coated marshmallows to assess the motility of a solid food bolus. The exam can be recorded as a digital or video fluoroscopy procedure. POST PROCEDURE It will take 1-2 days for the barium to pass through your system. To facilitate this, it is important, unless otherwise directed, to increase your fluids for the next 24-48hrs and to resume your normal diet.  This test typically takes about 30 minutes to  perform. _____________________________________________________________________________  Continue Prilosec.  We have sent the following medications to your pharmacy for you to pick up at your convenience: Linzess 145 mcg daily.  If you are age 50 or older, your body mass index should be between 23-30. Your Body mass index is 20.39 kg/m. If this is out of the aforementioned range listed, please consider follow up with your Primary Care Provider.  If you are age 56 or younger, your body mass index should be between 19-25. Your Body mass index is 20.39 kg/m. If this is out of the aformentioned range listed, please consider follow up with your Primary Care Provider.

## 2018-05-12 NOTE — Progress Notes (Signed)
Subjective:    Patient ID: Sierra Morris, female    DOB: [birth year], 57 y.o.   MRN: [id number]  HPI Sierra Morris is a 57 year old female with a history of adenomatous colon polyp, diverticulosis with history of diverticulitis, chronic constipation, chronic pain on narcotics, GERD, multiple sclerosis, cervical disc disease and fibromyalgia who is here for follow-up.  She reports that she has been doing fairly well.  She did have a cervical disc surgery and reports having a cervical "cage" placed on 3 cervical vertebrae.  Prior to this she was having issues with neck pain but also trouble swallowing.  Since the surgery her swallowing has been worse.  She reports he can hurt to swallow and it feels like it is difficult for her to swallow pills and solid food.  Liquids also occasionally problematic.  She states that it hurts if she bends over particularly at the waist.  With bending over she feels like she cannot breathe.  She was started on Prilosec 40 mg as she was having some heartburn but there was some suspicion as to whether her trouble swallowing could be related to GERD.  No change in her swallowing symptoms on Prilosec though heartburn is better.  Bowel movements are more normal when she uses Linzess 145 mcg daily.  She is been out of this medication.  She is back on hydrocodone for neck pain, leg pain and also multiple sclerosis pain.  This causes significant issues with constipation and she feels that she needs the Linzess.  No blood in her stool or melena  I performed a colonoscopy last year on 02/27/2017.  This revealed a 3 mm adenoma in the ascending colon.  Diverticulosis in the sigmoid and at hepatic flexure.   Review of Systems As per HPI, otherwise negative  Current Medications, Allergies, Past Medical History, Past Surgical History, Family History and Social History were reviewed in [employer] record.     Objective:   Physical Exam BP 122/78   Pulse 76    Ht 5' (1.524 m)   Wt 104 lb 6.4 oz (47.4 kg)   SpO2 96%   BMI 20.39 kg/m  Constitutional: Well-developed and well-nourished. No distress. HEENT: Normocephalic and atraumatic.  Conjunctivae are normal.  No scleral icterus. Neck: Neck supple. Trachea midline. Cardiovascular: Normal rate, regular rhythm and intact distal pulses. Pulmonary/chest: Effort normal and breath sounds normal. No wheezing, rales or rhonchi. Abdominal: Soft, nontender, nondistended. Bowel sounds active throughout.  Extremities: no clubbing, cyanosis, or edema Neurological: Alert and oriented to person place and time. Skin: Skin is warm and dry. Psychiatric: Normal mood and affect. Behavior is normal.     Assessment & Plan:  57 year old female with a history of adenomatous colon polyp, diverticulosis with history of diverticulitis, chronic constipation, chronic pain on narcotics, GERD, multiple sclerosis, cervical disc disease and fibromyalgia who is here for follow-up.  1.  Dysphagia in the setting of cervical disc disease and recent cervical disc surgery --suspicion is that she is having trouble swallowing due to her cervical hardware.  I recommended a barium esophagram with tablet for further evaluation. For now she will continue Prilosec 40 mg daily which is helping with heartburn but would not be expected to improve swallowing if this is anatomic after disc surgery.  2.  Chronic constipation --narcotic related.  Continue Linzess 145 mcg daily  3.  History of adenomatous colon polyp --surveillance colonoscopy in 2023.  25 minutes spent with the patient today. Greater than 50%  was spent in counseling and coordination of care with the patient

## 2018-05-29 ENCOUNTER — Ambulatory Visit (HOSPITAL_COMMUNITY)
Admission: RE | Admit: 2018-05-29 | Discharge: 2018-05-29 | Disposition: A | Discharge status: Home / Self Care | Payer: Medicare Other | Source: Ambulatory Visit | Attending: Internal Medicine | Admitting: Internal Medicine

## 2018-05-29 DIAGNOSIS — R1310 Dysphagia, unspecified: Secondary | ICD-10-CM | POA: Diagnosis present

## 2018-09-03 ENCOUNTER — Other Ambulatory Visit: Payer: Self-pay | Admitting: Internal Medicine

## 2018-09-25 ENCOUNTER — Ambulatory Visit: Payer: Medicare Other | Admitting: Internal Medicine

## 2018-12-26 ENCOUNTER — Other Ambulatory Visit: Payer: Self-pay | Admitting: Internal Medicine

## 2019-01-12 DIAGNOSIS — F32A Depression, unspecified: Secondary | ICD-10-CM | POA: Insufficient documentation

## 2019-01-12 DIAGNOSIS — F419 Anxiety disorder, unspecified: Secondary | ICD-10-CM | POA: Insufficient documentation

## 2019-01-12 DIAGNOSIS — M797 Fibromyalgia: Secondary | ICD-10-CM | POA: Insufficient documentation

## 2019-01-12 DIAGNOSIS — G35 Multiple sclerosis: Secondary | ICD-10-CM | POA: Insufficient documentation

## 2019-01-12 DIAGNOSIS — F900 Attention-deficit hyperactivity disorder, predominantly inattentive type: Secondary | ICD-10-CM | POA: Insufficient documentation

## 2019-01-13 ENCOUNTER — Other Ambulatory Visit: Payer: Self-pay | Admitting: Psychiatry

## 2019-01-13 DIAGNOSIS — G35 Multiple sclerosis: Secondary | ICD-10-CM

## 2019-01-14 ENCOUNTER — Other Ambulatory Visit: Payer: Self-pay | Admitting: Obstetrics and Gynecology

## 2019-01-14 DIAGNOSIS — R921 Mammographic calcification found on diagnostic imaging of breast: Secondary | ICD-10-CM

## 2019-02-08 ENCOUNTER — Other Ambulatory Visit: Payer: Self-pay | Admitting: Obstetrics and Gynecology

## 2019-02-08 ENCOUNTER — Other Ambulatory Visit: Payer: Self-pay

## 2019-02-08 ENCOUNTER — Ambulatory Visit
Admission: RE | Admit: 2019-02-08 | Discharge: 2019-02-08 | Disposition: A | Discharge status: Home / Self Care | Payer: Medicare Other | Source: Ambulatory Visit | Attending: Obstetrics and Gynecology | Admitting: Obstetrics and Gynecology

## 2019-02-08 ENCOUNTER — Ambulatory Visit
Admission: RE | Admit: 2019-02-08 | Discharge: 2019-02-08 | Disposition: A | Discharge status: Home / Self Care | Payer: Medicare Other | Source: Ambulatory Visit | Attending: Psychiatry | Admitting: Psychiatry

## 2019-02-08 DIAGNOSIS — R921 Mammographic calcification found on diagnostic imaging of breast: Secondary | ICD-10-CM

## 2019-02-08 DIAGNOSIS — G35 Multiple sclerosis: Secondary | ICD-10-CM

## 2019-02-08 MED ORDER — GADOBENATE DIMEGLUMINE 529 MG/ML IV SOLN
9.0000 mL | Freq: Once | INTRAVENOUS | Status: AC | PRN
  Administered 2019-02-08: 9 mL via INTRAVENOUS

## 2019-02-25 ENCOUNTER — Encounter: Payer: Self-pay | Admitting: *Deleted

## 2019-03-03 ENCOUNTER — Ambulatory Visit (INDEPENDENT_AMBULATORY_CARE_PROVIDER_SITE_OTHER): Payer: Medicare Other | Admitting: Internal Medicine

## 2019-03-03 ENCOUNTER — Other Ambulatory Visit: Payer: Self-pay

## 2019-03-03 ENCOUNTER — Encounter: Payer: Self-pay | Admitting: Internal Medicine

## 2019-03-03 VITALS — Ht 60.0 in | Wt 104.0 lb

## 2019-03-03 DIAGNOSIS — R1310 Dysphagia, unspecified: Secondary | ICD-10-CM

## 2019-03-03 DIAGNOSIS — K581 Irritable bowel syndrome with constipation: Secondary | ICD-10-CM

## 2019-03-03 DIAGNOSIS — K219 Gastro-esophageal reflux disease without esophagitis: Secondary | ICD-10-CM

## 2019-03-03 DIAGNOSIS — K5909 Other constipation: Secondary | ICD-10-CM | POA: Diagnosis not present

## 2019-03-03 NOTE — Progress Notes (Signed)
Subjective:    Patient ID: Sierra Morris, female    DOB: 1960/09/29, 58 y.o.   MRN: [id number]  This service was provided via telemedicine.  Doximity application with A/V communication The patient was located at home The provider was located in provider's GI office. The patient did consent to this telephone visit and is aware of possible charges through their insurance for this visit.   The persons participating in this telemedicine service were the patient and I. Time spent on call: 21 minutes   HPI Sierra Morris is a 58 yo female with PMH of adenomatous colon polyps, diverticulosis with history of diverticulitis, chronic constipation worsened by chronic narcotic pain medications, GERD, MS, cervical disc disease with recent surgery and fibromyalgia here for follow-up.  She reports her biggest issue of late has been alternating constipation and diarrhea.  She reports she seems to have a cycle where she will have constipation for several days followed by hard stool but then quickly followed by loose uncontrollable stool with urgency.  The diarrhea can persist for a day or 2 and then the cycle starts over.  She notices intestinal gas but also that the discomfort and mucus in her stool.  No blood in her stool.  She has had a difficult time timing her Linzess.  She was told she needed to take this before her first meal and this is harder for her to remember and be consistent with.  She did have a barium esophagram after our last visit.  Her swallowing seems to be doing somewhat better but there are times with swallowing where she will have issues with pills.  No real issues swallowing food or water/liquid.  She has noticed at night particularly after going to sleep waking up with some regurgitation coughing and choking sensation.  She had an MRI done earlier this month of her cervical spine after she had C3-C7 disc surgery.  She will follow-up with her surgeon next week.   Review of Systems As  per HPI, otherwise negative  Current Medications, Allergies, Past Medical History, Past Surgical History, Family History and Social History were reviewed in [employer] record.     Objective:   Physical Exam No PE, virtual visit  ESOPHOGRAM / BARIUM SWALLOW / BARIUM TABLET STUDY   TECHNIQUE: Combined double contrast and single contrast examination performed using effervescent crystals, thick barium liquid, and thin barium liquid. The patient was observed with fluoroscopy swallowing a 13 mm barium sulphate tablet.   FLUOROSCOPY TIME:  Fluoroscopy Time:  1 minutes 54 seconds   Radiation Exposure Index (if provided by the fluoroscopic device): 8.5 mGy   Number of Acquired Spot Images: 0   COMPARISON:  None.   FINDINGS: Hypopharyngeal portion of the exam demonstrates anterior C4-7 fixation. The contrast is not significantly delayed and passage at this site. The esophageal lumen is normal in caliber, with a mild cricopharyngeus impression. Example image 41/2.   Double contrast evaluation of the esophagus demonstrates no mucosal abnormality.   Evaluation of esophageal motility demonstrates proximal escape waves with contrast stasis in the upper esophagus. A tiny hiatal hernia.   Full column evaluation of the esophagus demonstrates no persistent narrowing or stricture.   A 13 millimeter barium tablet passes promptly.   IMPRESSION: 1. Anterior fixation at C4-7. No significant obstruction to contrast passage at this level. 2. Mild esophageal dysmotility, likely presbyesophagus. 3.  Tiny hiatal hernia.     Electronically Signed   By: Adria Devon.D.  On: 05/29/2018 12:23  MRI CERVICAL SPINE WITHOUT AND WITH CONTRAST   TECHNIQUE: Multiplanar and multiecho pulse sequences of the cervical spine, to include the craniocervical junction and cervicothoracic junction, were obtained without and with intravenous contrast.   CONTRAST:  66mL MULTIHANCE  GADOBENATE DIMEGLUMINE 529 MG/ML IV SOLN   COMPARISON:  MRI cervical spine 08/06/2018   FINDINGS: Alignment: Normal   Vertebrae: ACDF with anterior plate C4 through C7. No fracture or mass.   Cord: Normal spinal cord signal. No cord lesion. No cord compression. Normal cord enhancement.   Posterior Fossa, vertebral arteries, paraspinal tissues: Negative   Disc levels:   C2-3: Left facet degeneration causing left foraminal encroachment   C3-4: Mild facet degeneration bilaterally without significant stenosis   C4-5: ACDF without significant stenosis   C5-6: ACDF without stenosis   C6-7: ACDF. Foraminal narrowing bilaterally due to uncinate spurring. Cord flattening with mild spinal stenosis   C7-T1: Negative   IMPRESSION: Normal spinal cord.  No evidence of demyelinization.   ACDF C4 through C7. Foraminal narrowing bilaterally at C6-7 due to spurring with mild associated spinal stenosis.     Electronically Signed   By: Franchot Gallo M.D.   On: 02/08/2019 15:34         Assessment & Plan:  58 yo female with PMH of adenomatous colon polyps, diverticulosis with history of diverticulitis, chronic constipation worsened by chronic narcotic pain medications, GERD, MS, cervical disc disease with recent surgery and fibromyalgia here for follow-up.  1. IBS with constipation --we discussed how her cycle of constipation followed by diarrhea is most consistent with IBS with constipation.  The diarrhea is coming from overflow as is the crampy abdominal pain.  We discussed how it is important to maintain more regular bowel movements and prevent the diarrhea that comes after being constipated.  It seems that she is having a hard time with timing Linzess and so I advised that she can move the timing away from her first meal of the day so that is easier for her to remember.  If she takes this consistently and she is not having more consistent bowel movements and still having the  alternating back-and-forth bowel habit then we should consider another laxative. --We will continue Linzess 145 mcg daily but moved this to a time where she can consistently take it to determine its efficacy  2.  Dysphagia/GERD --pill dysphagia.  Barium esophagram did not show any concerning findings.  Possibly some presbyesophagus.  We discussed dysmotility.  She will follow-up with her neck surgeon next week.  Barium tablet passed to the stomach easily therefore dilation may not help much.  For now we will continue her daily PPI and will not proceed with endoscopy at present.  She will let me know if her swallowing symptoms worsen.  3.  History of adenomatous colon polyp --surveillance colonoscopy is recommended in 2023

## 2019-03-04 NOTE — Patient Instructions (Addendum)
Continue Linzess 145 mcg daily but move this to a time where you can consistently take it to determine its efficacy.  Continue omeprazole. Let us know if your swallowing symptoms worsen.  You will be due for a recall colonoscopy in 2023. We will send you a reminder in the mail when it gets closer to that time.  If you are age 58 or older, your body mass index should be between 23-30. Your Body mass index is 20.31 kg/m. If this is out of the aforementioned range listed, please consider follow up with your Primary Care Provider.  If you are age 26 or younger, your body mass index should be between 19-25. Your Body mass index is 20.31 kg/m. If this is out of the aformentioned range listed, please consider follow up with your Primary Care Provider.

## 2019-03-25 ENCOUNTER — Other Ambulatory Visit: Payer: Self-pay | Admitting: Internal Medicine

## 2019-08-06 ENCOUNTER — Telehealth: Payer: Self-pay | Admitting: Internal Medicine

## 2019-08-06 MED ORDER — DICYCLOMINE HCL 10 MG PO CAPS: ORAL_CAPSULE | ORAL | 5 refills | Status: DC

## 2019-08-06 NOTE — Telephone Encounter (Signed)
Ok to refill 10-20 mg [facility]

## 2019-08-06 NOTE — Telephone Encounter (Signed)
Patient needs to refill Bentyl. Please send it to [facility] pharmacy in [place].

## 2019-08-06 NOTE — Telephone Encounter (Signed)
Prescription sent to patient's pharmacy and patient notified.  

## 2019-08-06 NOTE — Telephone Encounter (Signed)
Patient states her prescription for dicyclomine ran out several years ago but she does remember it helping with her abdominal cramping. Patient has more abdominal pain recently wanted to know if we will refill the prescription. Dr. Hilarie Fredrickson is this ok to refill?

## 2019-12-10 ENCOUNTER — Telehealth: Payer: Self-pay | Admitting: Internal Medicine

## 2019-12-10 NOTE — Telephone Encounter (Signed)
Pt was getting her omeprazole from her PCP. PCP wants her to have an ov with her prior to refills. Pt wants to know if Dr. Hilarie Fredrickson will prescribe this for her as she is not having any new issues. Please advise.

## 2019-12-12 NOTE — Telephone Encounter (Signed)
Ok to refill 

## 2019-12-13 ENCOUNTER — Other Ambulatory Visit: Payer: Self-pay

## 2019-12-13 MED ORDER — OMEPRAZOLE 40 MG PO CPDR: 40.0000 mg | DELAYED_RELEASE_CAPSULE | Freq: Every day | ORAL | 3 refills | Status: DC

## 2019-12-13 NOTE — Telephone Encounter (Signed)
Spoke with pt and she is aware refill has been sent to her pharmacy.

## 2020-01-20 ENCOUNTER — Other Ambulatory Visit: Payer: Self-pay | Admitting: Internal Medicine

## 2020-05-05 ENCOUNTER — Other Ambulatory Visit: Payer: Self-pay | Admitting: Internal Medicine

## 2020-05-27 ENCOUNTER — Other Ambulatory Visit: Payer: Self-pay | Admitting: Internal Medicine

## 2020-06-06 ENCOUNTER — Telehealth: Payer: Self-pay | Admitting: Internal Medicine

## 2020-06-06 NOTE — Telephone Encounter (Signed)
Left message for patient to call back  Patient not seen since 02/2019

## 2020-06-09 NOTE — Telephone Encounter (Signed)
Pt has not returned the call, will await further communication from the pt.

## 2020-06-14 ENCOUNTER — Ambulatory Visit: Payer: Medicare Other | Admitting: Nurse Practitioner

## 2020-09-04 ENCOUNTER — Other Ambulatory Visit: Payer: Self-pay

## 2020-09-04 ENCOUNTER — Other Ambulatory Visit: Payer: Self-pay | Admitting: Internal Medicine

## 2020-09-04 MED ORDER — DICYCLOMINE HCL 10 MG PO CAPS: ORAL_CAPSULE | ORAL | 0 refills | Status: DC

## 2020-09-29 ENCOUNTER — Other Ambulatory Visit: Payer: Self-pay | Admitting: Internal Medicine

## 2022-01-12 ENCOUNTER — Other Ambulatory Visit: Payer: Self-pay | Admitting: Internal Medicine

## 2022-01-16 ENCOUNTER — Other Ambulatory Visit (INDEPENDENT_AMBULATORY_CARE_PROVIDER_SITE_OTHER): Payer: Medicare Other

## 2022-01-16 ENCOUNTER — Ambulatory Visit (INDEPENDENT_AMBULATORY_CARE_PROVIDER_SITE_OTHER): Payer: Medicare Other | Admitting: Internal Medicine

## 2022-01-16 ENCOUNTER — Encounter: Payer: Self-pay | Admitting: Internal Medicine

## 2022-01-16 VITALS — BP 110/62 | HR 64 | Ht 60.0 in | Wt 105.8 lb

## 2022-01-16 DIAGNOSIS — Z86010 Personal history of colonic polyps: Secondary | ICD-10-CM | POA: Diagnosis not present

## 2022-01-16 DIAGNOSIS — K529 Noninfective gastroenteritis and colitis, unspecified: Secondary | ICD-10-CM | POA: Diagnosis not present

## 2022-01-16 DIAGNOSIS — K5909 Other constipation: Secondary | ICD-10-CM | POA: Diagnosis not present

## 2022-01-16 DIAGNOSIS — K219 Gastro-esophageal reflux disease without esophagitis: Secondary | ICD-10-CM | POA: Diagnosis not present

## 2022-01-16 LAB — CBC WITH DIFFERENTIAL/PLATELET
Basophils Absolute: 0 10*3/uL (ref 0.0–0.1)
Basophils Relative: 0.8 % (ref 0.0–3.0)
Eosinophils Absolute: 0.1 10*3/uL (ref 0.0–0.7)
Eosinophils Relative: 1.9 % (ref 0.0–5.0)
HCT: 41.5 % (ref 36.0–46.0)
Hemoglobin: 13.6 g/dL (ref 12.0–15.0)
Lymphocytes Relative: 10.5 % — ABNORMAL LOW (ref 12.0–46.0)
Lymphs Abs: 0.4 10*3/uL — ABNORMAL LOW (ref 0.7–4.0)
MCHC: 32.7 g/dL (ref 30.0–36.0)
MCV: 90.9 fl (ref 78.0–100.0)
Monocytes Absolute: 0.4 10*3/uL (ref 0.1–1.0)
Monocytes Relative: 11.4 % (ref 3.0–12.0)
Neutro Abs: 2.6 10*3/uL (ref 1.4–7.7)
Neutrophils Relative %: 75.4 % (ref 43.0–77.0)
Platelets: 217 10*3/uL (ref 150.0–400.0)
RBC: 4.56 Mil/uL (ref 3.87–5.11)
RDW: 15.4 % (ref 11.5–15.5)
WBC: 3.5 10*3/uL — ABNORMAL LOW (ref 4.0–10.5)

## 2022-01-16 LAB — COMPREHENSIVE METABOLIC PANEL
ALT: 29 U/L (ref 0–35)
AST: 21 U/L (ref 0–37)
Albumin: 4.3 g/dL (ref 3.5–5.2)
Alkaline Phosphatase: 64 U/L (ref 39–117)
BUN: 13 mg/dL (ref 6–23)
CO2: 23 mEq/L (ref 19–32)
Calcium: 8.8 mg/dL (ref 8.4–10.5)
Chloride: 109 mEq/L (ref 96–112)
Creatinine, Ser: 0.81 mg/dL (ref 0.40–1.20)
GFR: 78.37 mL/min (ref >60.00)
Glucose, Bld: 89 mg/dL (ref 70–99)
Potassium: 4 mEq/L (ref 3.5–5.1)
Sodium: 137 mEq/L (ref 135–145)
Total Bilirubin: 0.3 mg/dL (ref 0.2–1.2)
Total Protein: 6.5 g/dL (ref 6.0–8.3)

## 2022-01-16 LAB — TSH: TSH: 1.17 u[IU]/mL (ref 0.35–5.50)

## 2022-01-16 MED ORDER — NA SULFATE-K SULFATE-MG SULF 17.5-3.13-1.6 GM/177ML PO SOLN: ORAL | 0 refills | Status: DC

## 2022-01-16 MED ORDER — DICYCLOMINE HCL 10 MG PO CAPS: ORAL_CAPSULE | ORAL | 2 refills | Status: DC

## 2022-01-16 NOTE — Patient Instructions (Addendum)
You have been scheduled for a colonoscopy. Please follow written instructions given to you at your visit today.  Please pick up your prep supplies at the pharmacy within the next 1-3 days. If you use inhalers (even only as needed), please bring them with you on the day of your procedure.   Your provider has requested that you go to the basement level for lab work before leaving today. Press "B" on the elevator. The lab is located at the first door on the left as you exit the elevator.   We have sent the following medications to your pharmacy for you to pick up at your convenience:  Bentyl  Suprep  Discontinue Omeprazole  Continue Lomotil   Due to recent changes in healthcare laws, you may see the results of your imaging and laboratory studies on MyChart before your provider has had a chance to review them.  We understand that in some cases there may be results that are confusing or concerning to you. Not all laboratory results come back in the same time frame and the provider may be waiting for multiple results in order to interpret others.  Please give [place] 48 hours in order for your provider to thoroughly review all the results before contacting the office for clarification of your results.   If you are age 19 or older, your body mass index should be between 23-30. Your Body mass index is 20.66 kg/m. If this is out of the aforementioned range listed, please consider follow up with your Primary Care Provider.  If you are age 56 or younger, your body mass index should be between 19-25. Your Body mass index is 20.66 kg/m. If this is out of the aformentioned range listed, please consider follow up with your Primary Care Provider.   ________________________________________________________  The [facility] providers would like to encourage you to use [facility] to communicate with providers for non-urgent requests or questions.  Due to long hold times on the telephone, sending your provider a message by  [facility] may be a faster and more efficient way to get a response.  Please allow 48 business hours for a response.  Please remember that this is for non-urgent requests.  _______________________________________________________   I appreciate the  opportunity to care for you  Thank You   Ulice Dash Pyrtle,MD

## 2022-01-16 NOTE — Progress Notes (Signed)
Subjective:    Patient ID: Sierra Morris, female    DOB: 1960/11/26, 61 y.o.   MRN: [id number]  HPI Kerrin Markman is a 61 year old female with a history of adenomatous colon polyps, diverticulosis with history of prior diverticulitis, chronic constipation, MS, GERD, history of fibromyalgia and cervical disc disease who is here to evaluate diarrhea.  She is here alone today and was last seen in the office by virtual visit on 03/03/2019.  She reports that around Christmas of 2022 her bowel movements changed.  She previously dealt with chronic constipation and use Linzess.  She was no longer needing Linzess and having more frequent bowel movements but this worsened over a period of months.  It had gotten so bad it was occurring 10 times per day.  Stools were urgent very watery.  Nonbloody.  Associated with abdominal cramping worse on the left side.  She certainly has not used any laxative.  She reports being seen by primary care and had 2 stool studies which were negative.  Lomotil was prescribed and she is using this 2-3 times a day.  It is helping but she is still having 3-4 loose stools per day.  She is having some nausea and is using Zofran which helps.  She is off of omeprazole and not having any heartburn.  She has noticed some headaches.  She became dehydrated and had an episode of syncope.  This was evaluated at [facility].  She had some soreness in the left ribs from striking a cabinet prior to her syncopal episode.  No new medicines around the time of her diarrhea initiation.  She has continued Zeposia for MS.  She has been on this at least 3 years.   Review of Systems     Objective:   Physical Exam BP 110/62   Pulse 64   Ht 5' (1.524 m)   Wt 105 lb 12.8 oz (48 kg)   SpO2 99%   BMI 20.66 kg/m  Gen: awake, alert, NAD HEENT: anicteric CV: RRR, no mrg Pulm: CTA b/l Abd: soft, mild tenderness in the left abdomen and lower abdomen without rebound or guarding, nondistended, +BS  throughout Ext: no c/c/e Neuro: nonfocal   CT ABDOMEN AND PELVIS WITH CONTRAST  TECHNIQUE: Multidetector CT imaging of the abdomen and pelvis was performed using the standard protocol following bolus administration of intravenous contrast.  RADIATION DOSE REDUCTION: This exam was performed according to the departmental dose-optimization program which includes automated exposure control, adjustment of the mA and/or kV according to patient size and/or use of iterative reconstruction technique.  CONTRAST: 100 mL of Isovue 370 IV.  COMPARISON: CT abdomen/pelvis December 11, 2021.  FINDINGS: Lower chest: No acute abnormality.  Hepatobiliary: No focal liver abnormality is seen. Status post cholecystectomy. No biliary dilatation.  Pancreas: Unremarkable. No pancreatic ductal dilatation or surrounding inflammatory changes.  Spleen: Normal in size without focal abnormality.  Adrenals/Urinary Tract: Adrenal glands are unremarkable. Kidneys are normal, without renal calculi, focal lesion, or hydronephrosis. Bladder is unremarkable.  Stomach/Bowel: Stomach is within normal limits. Been index is not visualized; however, no inflammatory change in the right lower quadrant to suggest appendicitis. No evidence of bowel wall thickening, distention, or inflammatory changes.  Vascular/Lymphatic: Aortic atherosclerosis. No enlarged abdominal or pelvic lymph nodes.  Reproductive: Status post hysterectomy. No adnexal masses.  Other: No abdominal wall hernia or abnormality. No abdominopelvic ascites.  Musculoskeletal: No fracture is seen.  IMPRESSION: No evidence of acute intra-abdominal abnormality.   Electronically Signed By: Albertina Parr  Adah Salvage M.D. On: 12/11/2021 15:20      Assessment & Plan:  62 year old female with a history of adenomatous colon polyps, diverticulosis with history of prior diverticulitis, chronic constipation, MS, GERD, history of fibromyalgia and cervical disc  disease who is here to evaluate diarrhea.   Chronic diarrhea/left-sided abdominal pain --her chronic diarrhea has been persistent now for over 6 months.  It was is much as 10 times per day which raises the question of microscopic colitis.  Reportedly she had stool studies though I cannot see these.  This does not sound like an acute infectious etiology given the length of trouble.  She is off of PPI.  No new medications which explain her symptoms.  CT scan was unremarkable recently with contrast.  Recommendations: --CBC, CMP, TSH, celiac panel, GI pathogen panel and fecal leukocytes --Colonoscopy if stool studies negative for infection and suspect they will be; rule out microscopic colitis; we discussed the risk, benefits and alternatives to colonoscopy and she is agreeable and wishes to proceed --Bentyl 10 to 20 mg 3-4 times daily as needed for crampy abdominal discomfort  2.  Prior IBS with constipation --no longer an issue, see #1  3.  History of adenomatous colon polyp --surveillance colonoscopy due this year anyway, colonoscopy as discussed in #1 will suffice for this indication as well  30 minutes total spent today including patient facing time, coordination of care, reviewing medical history/procedures/pertinent radiology studies, and documentation of the encounter.

## 2022-01-17 ENCOUNTER — Other Ambulatory Visit: Payer: Medicare Other

## 2022-01-17 DIAGNOSIS — K529 Noninfective gastroenteritis and colitis, unspecified: Secondary | ICD-10-CM

## 2022-01-17 DIAGNOSIS — Z86010 Personal history of colonic polyps: Secondary | ICD-10-CM

## 2022-01-17 LAB — TISSUE TRANSGLUTAMINASE, IGA: (tTG) Ab, IgA: <1 U/mL

## 2022-01-17 LAB — IGA: Immunoglobulin A: 125 mg/dL (ref 70–320)

## 2022-01-18 LAB — GI PROFILE, STOOL, PCR

## 2022-01-22 LAB — FECAL LEUKOCYTES
Micro Number: 13642876
Result: NOT DETECTED
Specimen Quality: ADEQUATE

## 2022-01-24 LAB — PANCREATIC ELASTASE, FECAL: Pancreatic Elastase-1, Stool: 495 mcg/g

## 2022-02-11 ENCOUNTER — Encounter: Payer: Self-pay | Admitting: Internal Medicine

## 2022-02-11 ENCOUNTER — Ambulatory Visit (AMBULATORY_SURGERY_CENTER): Payer: Medicare Other | Admitting: Internal Medicine

## 2022-02-11 VITALS — BP 110/76 | HR 67 | Temp 98.9°F | Resp 15 | Ht 60.0 in | Wt 105.0 lb

## 2022-02-11 DIAGNOSIS — K529 Noninfective gastroenteritis and colitis, unspecified: Secondary | ICD-10-CM | POA: Diagnosis not present

## 2022-02-11 DIAGNOSIS — D125 Benign neoplasm of sigmoid colon: Secondary | ICD-10-CM

## 2022-02-11 DIAGNOSIS — K635 Polyp of colon: Secondary | ICD-10-CM

## 2022-02-11 DIAGNOSIS — D123 Benign neoplasm of transverse colon: Secondary | ICD-10-CM

## 2022-02-11 DIAGNOSIS — K5730 Diverticulosis of large intestine without perforation or abscess without bleeding: Secondary | ICD-10-CM

## 2022-02-11 DIAGNOSIS — D122 Benign neoplasm of ascending colon: Secondary | ICD-10-CM

## 2022-02-11 MED ORDER — SODIUM CHLORIDE 0.9 % IV SOLN: 500.0000 mL | Freq: Once | INTRAVENOUS | Status: DC

## 2022-02-11 NOTE — Progress Notes (Signed)
To pacu, VSS. Report to Rn.tb 

## 2022-02-11 NOTE — Op Note (Signed)
[facility] Patient Name: Sierra Morris Procedure Date: 02/11/2022 2:46 PM MRN: [id number] Endoscopist: Jerene Bears , MD Age: 61 Referring MD:  Date of Birth: 01-15-1961 Gender: Female Account #: [id number] Procedure:                Colonoscopy Indications:              Chronic diarrhea Medicines:                Propofol per Anesthesia Procedure:                Pre-Anesthesia Assessment:                           - Prior to the procedure, a History and Physical                            was performed, and patient medications and                            allergies were reviewed. The patient's tolerance of                            previous anesthesia was also reviewed. The risks                            and benefits of the procedure and the sedation                            options and risks were discussed with the patient.                            All questions were answered, and informed consent                            was obtained. Prior Anticoagulants: The patient has                            taken no previous anticoagulant or antiplatelet                            agents. ASA Grade Assessment: II - A patient with                            mild systemic disease. After reviewing the risks                            and benefits, the patient was deemed in                            satisfactory condition to undergo the procedure.                           After obtaining informed consent, the colonoscope  was passed under direct vision. Throughout the                            procedure, the patient's blood pressure, pulse, and                            oxygen saturations were monitored continuously. The                            0405 PCF-H190TL Slim SB Colonoscope was introduced                            through the anus and advanced to the terminal                            ileum. The colonoscopy was performed without                             difficulty. The patient tolerated the procedure                            well. The quality of the bowel preparation was                            excellent. The terminal ileum, ileocecal valve,                            appendiceal orifice, and rectum were photographed. Scope In: 2:57:25 PM Scope Out: 3:19:27 PM Scope Withdrawal Time: 0 hours 12 minutes 59 seconds  Total Procedure Duration: 0 hours 22 minutes 2 seconds  Findings:                 The digital rectal exam was normal.                           The terminal ileum appeared normal.                           A 3 mm polyp was found in the ascending colon. The                            polyp was sessile. The polyp was removed with a                            cold snare. Resection and retrieval were complete.                           Two sessile polyps were found in the transverse                            colon. The polyps were 4 to 5 mm in size. These                            polyps  were removed with a cold snare. Resection                            and retrieval were complete.                           Two sessile polyps were found in the sigmoid colon.                            The polyps were 3 to 5 mm in size. These polyps                            were removed with a cold snare. Resection and                            retrieval were complete.                           Multiple small-mouthed diverticula were found in                            the sigmoid colon.                           The exam was otherwise without abnormality on                            direct and retroflexion views.                           Biopsies for histology were taken with a cold                            forceps from the right colon and left colon for                            evaluation of microscopic colitis. Complications:            No immediate complications. Estimated Blood Loss:     Estimated blood  loss was minimal. Impression:               - The examined portion of the ileum was normal.                           - One 3 mm polyp in the ascending colon, removed                            with a cold snare. Resected and retrieved.                           - Two 4 to 5 mm polyps in the transverse colon,                            removed with a cold snare. Resected and retrieved.                           -  Two 3 to 5 mm polyps in the sigmoid colon,                            removed with a cold snare. Resected and retrieved.                           - Diverticulosis in the sigmoid colon.                           - The examination was otherwise normal on direct                            and retroflexion views.                           - Biopsies were taken with a cold forceps from the                            right colon and left colon for evaluation of                            microscopic colitis. Recommendation:           - Patient has a contact number available for                            emergencies. The signs and symptoms of potential                            delayed complications were discussed with the                            patient. Return to normal activities tomorrow.                            Written discharge instructions were provided to the                            patient.                           - Resume previous diet.                           - Continue present medications.                           - Await pathology results.                           - Repeat colonoscopy is recommended for                            surveillance. The colonoscopy date will be  determined after pathology results from today's                            exam become available for review. Jerene Bears, MD 02/11/2022 3:24:28 PM This report has been signed electronically.

## 2022-02-11 NOTE — Progress Notes (Signed)
Called to room to assist during endoscopic procedure.  Patient ID and intended procedure confirmed with present staff. Received instructions for my participation in the procedure from the performing physician.  

## 2022-02-11 NOTE — Patient Instructions (Signed)
Read all of the handouts given to you by your recovery room nurse.  YOU HAD AN ENDOSCOPIC PROCEDURE TODAY AT [facility]:   Refer to the procedure report that was given to you for any specific questions about what was found during the examination.  If the procedure report does not answer your questions, please call your gastroenterologist to clarify.  If you requested that your care partner not be given the details of your procedure findings, then the procedure report has been included in a sealed envelope for you to review at your convenience later.  YOU SHOULD EXPECT: Some feelings of bloating in the abdomen. Passage of more gas than usual.  Walking can help get rid of the air that was put into your GI tract during the procedure and reduce the bloating. If you had a lower endoscopy (such as a colonoscopy or flexible sigmoidoscopy) you may notice spotting of blood in your stool or on the toilet paper. If you underwent a bowel prep for your procedure, you may not have a normal bowel movement for a few days.  Please Note:  You might notice some irritation and congestion in your nose or some drainage.  This is from the oxygen used during your procedure.  There is no need for concern and it should clear up in a day or so.  SYMPTOMS TO REPORT IMMEDIATELY:  Following lower endoscopy (colonoscopy or flexible sigmoidoscopy):  Excessive amounts of blood in the stool  Significant tenderness or worsening of abdominal pains  Swelling of the abdomen that is new, acute  Fever of 100F or higher  For urgent or emergent issues, a gastroenterologist can be reached at any hour by calling [phone]. Do not use MyChart messaging for urgent concerns.    DIET:  We do recommend a small meal at first, but then you may proceed to your regular diet.  Drink plenty of fluids but you should avoid alcoholic beverages for 24 hours. Try to eat a high fiber diet, and drink plenty of water.  ACTIVITY:   You should plan to take it easy for the rest of today and you should NOT DRIVE or use heavy machinery until tomorrow (because of the sedation medicines used during the test).    FOLLOW UP: Our staff will call the number listed on your records the next business day following your procedure.  We will call around 7:15- 8:00 am to check on you and address any questions or concerns that you may have regarding the information given to you following your procedure. If we do not reach you, we will leave a message.  If you develop any symptoms (ie: fever, flu-like symptoms, shortness of breath, cough etc.) before then, please call [phone].  If you test positive for Covid 19 in the 2 weeks post procedure, please call and report this information to [place].    If any biopsies were taken you will be contacted by phone or by letter within the next 1-3 weeks.  Please call us at [phone] if you have not heard about the biopsies in 3 weeks.    SIGNATURES/CONFIDENTIALITY: You and/or your care partner have signed paperwork which will be entered into your electronic medical record.  These signatures attest to the fact that that the information above on your After Visit Summary has been reviewed and is understood.  Full responsibility of the confidentiality of this discharge information lies with you and/or your care-partner.

## 2022-02-11 NOTE — Progress Notes (Signed)
Pt's states no medical or surgical changes since previsit or office visit. 

## 2022-02-11 NOTE — Progress Notes (Signed)
See office note dated 01/16/2022 for details and current H&P  Patient presenting for colonoscopy to evaluate chronic diarrhea and exclude microscopic colitis.  She remains appropriate for colonoscopy today.

## 2022-02-12 ENCOUNTER — Telehealth: Payer: Self-pay | Admitting: *Deleted

## 2022-02-12 NOTE — Telephone Encounter (Signed)
  Follow up Call-     02/11/2022    2:05 PM  Call back number  Post procedure Call Back phone  # [phone]  Permission to leave phone message No     Patient questions:  Do you have a fever, pain , or abdominal swelling? No. Pain Score  0 *  Have you tolerated food without any problems? Yes.    Have you been able to return to your normal activities? Yes.    Do you have any questions about your discharge instructions: Diet   No. Medications  No. Follow up visit  No.  Do you have questions or concerns about your Care? No.  Actions: * If pain score is 4 or above: No action needed, pain <4.

## 2022-02-19 ENCOUNTER — Telehealth: Payer: Self-pay | Admitting: Internal Medicine

## 2022-02-19 DIAGNOSIS — K529 Noninfective gastroenteritis and colitis, unspecified: Secondary | ICD-10-CM

## 2022-02-19 MED ORDER — DIPHENOXYLATE-ATROPINE 2.5-0.025 MG PO TABS: 1.0000 | ORAL_TABLET | Freq: Four times a day (QID) | ORAL | 1 refills | Status: DC | PRN

## 2022-02-19 NOTE — Telephone Encounter (Signed)
Spoke with pt and she states she will try the bentyl and let us know if this does not help. Pt states she is still having lots of diarrhea and having to take 3 lomotil daily. Her PCP prescribed the lomotil. She also states she has a terrible taste in her mouth. She is requesting a refill on lomotil. Please advise.

## 2022-02-19 NOTE — Telephone Encounter (Signed)
Patient called, states her symptoms had worsen after her procedure. Requesting a call back.

## 2022-02-19 NOTE — Telephone Encounter (Signed)
Med sent.

## 2022-04-13 ENCOUNTER — Other Ambulatory Visit: Payer: Self-pay | Admitting: Internal Medicine

## 2022-04-22 ENCOUNTER — Other Ambulatory Visit: Payer: Self-pay

## 2022-04-22 DIAGNOSIS — M810 Age-related osteoporosis without current pathological fracture: Secondary | ICD-10-CM | POA: Insufficient documentation

## 2022-04-23 ENCOUNTER — Telehealth: Payer: Self-pay | Admitting: Pharmacy Technician

## 2022-04-23 ENCOUNTER — Other Ambulatory Visit: Payer: Self-pay | Admitting: Obstetrics and Gynecology

## 2022-04-23 DIAGNOSIS — R928 Other abnormal and inconclusive findings on diagnostic imaging of breast: Secondary | ICD-10-CM

## 2022-04-23 NOTE — Telephone Encounter (Signed)
Auth Submission: NO AUTH NEEDED Payer: medicare a/b Medication & CPT/J Code(s) submitted: Reclast (Zolendronic acid) [id number] Route of submission (phone, fax, portal):  Phone # [phone] Fax # Auth type: Buy/Bill Units/visits requested: x1 dose Reference number: [id number] Approval from: 04/23/22 to 07/07/22

## 2022-05-01 ENCOUNTER — Ambulatory Visit
Admission: RE | Admit: 2022-05-01 | Discharge: 2022-05-01 | Disposition: A | Discharge status: Home / Self Care | Payer: Medicare Other | Source: Ambulatory Visit | Attending: Obstetrics and Gynecology | Admitting: Obstetrics and Gynecology

## 2022-05-01 ENCOUNTER — Ambulatory Visit: Admission: RE | Admit: 2022-05-01 | Payer: Medicare Other | Source: Ambulatory Visit

## 2022-05-01 ENCOUNTER — Ambulatory Visit (INDEPENDENT_AMBULATORY_CARE_PROVIDER_SITE_OTHER): Payer: Medicare Other

## 2022-05-01 VITALS — BP 102/67 | HR 71 | Temp 98.3°F | Resp 18 | Ht 60.0 in | Wt 109.0 lb

## 2022-05-01 DIAGNOSIS — R928 Other abnormal and inconclusive findings on diagnostic imaging of breast: Secondary | ICD-10-CM

## 2022-05-01 DIAGNOSIS — M810 Age-related osteoporosis without current pathological fracture: Secondary | ICD-10-CM | POA: Diagnosis not present

## 2022-05-01 MED ORDER — ACETAMINOPHEN 325 MG PO TABS
650.0000 mg | ORAL_TABLET | Freq: Once | ORAL | Status: AC
  Administered 2022-05-01: 650 mg via ORAL
  Filled 2022-05-01: qty 2

## 2022-05-01 MED ORDER — ZOLEDRONIC ACID 5 MG/100ML IV SOLN
5.0000 mg | Freq: Once | INTRAVENOUS | Status: AC
  Administered 2022-05-01: 5 mg via INTRAVENOUS
  Filled 2022-05-01: qty 100

## 2022-05-01 MED ORDER — SODIUM CHLORIDE 0.9 % IV SOLN: INTRAVENOUS | Status: DC

## 2022-05-01 MED ORDER — DIPHENHYDRAMINE HCL 25 MG PO CAPS: 25.0000 mg | ORAL_CAPSULE | Freq: Once | ORAL | Status: DC

## 2022-05-01 NOTE — Patient Instructions (Signed)

## 2022-05-01 NOTE — Progress Notes (Signed)
Diagnosis: Osteoporosis  Provider:  Marshell Garfinkel MD  Procedure: Infusion  IV Type: Peripheral, IV Location: R Forearm  Reclast (Zolendronic Acid), Dose: 5 mg  Infusion Start Time: 8502  Infusion Stop Time: 1213  Post Infusion IV Care: Observation period completed and Peripheral IV Discontinued  Discharge: Condition: Good, Destination: Home . AVS provided to patient.   Performed by:  Cleophus Molt, RN

## 2022-08-06 ENCOUNTER — Telehealth: Payer: Self-pay

## 2022-08-06 NOTE — Patient Outreach (Signed)
  Care Coordination   08/06/2022 Name: Sierra Morris MRN: [id number] DOB: 07-11-1960   Care Coordination Outreach Attempts:  An unsuccessful telephone outreach was attempted today to offer the patient information about available care coordination services as a benefit of their health plan.   Follow Up Plan:  Additional outreach attempts will be made to offer the patient care coordination information and services.   Encounter Outcome:  No Answer   Care Coordination Interventions:  No, not indicated   Tomasa Rand, RN, BSN, [facility] [employer] [phone]

## 2022-08-13 ENCOUNTER — Telehealth: Payer: Self-pay

## 2022-08-13 NOTE — Patient Outreach (Signed)
  Care Coordination   Initial Visit Note   08/13/2022 Name: SHAGUANA LOVE MRN: [id number] DOB: Sep 29, 1960  Yvetta Coder Lukin is a 62 y.o. year old female who sees Myrlene Broker, MD for primary care. I spoke with  [name] by phone today.  What matters to the patients health and wellness today?  Placed call to patient today to review and offer [facility] care coordination program. Patient reports that she is doing well and denies any needs today.     SDOH assessments and interventions completed:  No     Care Coordination Interventions:  No, not indicated   Follow up plan: No further intervention required.   Encounter Outcome:  Pt. Refused   Tomasa Rand, RN, BSN, CEN [facility] [employer] [phone]

## 2022-12-06 ENCOUNTER — Telehealth: Payer: Self-pay | Admitting: Internal Medicine

## 2022-12-06 ENCOUNTER — Other Ambulatory Visit: Payer: Self-pay | Admitting: *Deleted

## 2022-12-06 MED ORDER — LINACLOTIDE 145 MCG PO CAPS: ORAL_CAPSULE | ORAL | 2 refills | Status: DC

## 2022-12-06 NOTE — Telephone Encounter (Signed)
Medication refill as requested.

## 2022-12-06 NOTE — Telephone Encounter (Signed)
Patient called requesting Linzess medication be sent into her pharmacy again. States she stopped taking it a few months ago because her symptoms resolved. States she recently had to start taking pain medication again because she is having abdominal pain. She is requesting for the Linzess medication be sent in again because she is have trouble with her bm's. States she is either constipated or has diarrhea. Requesting a call back to discuss this further. Please advise, thank you.

## 2023-02-20 ENCOUNTER — Telehealth: Payer: Self-pay | Admitting: Internal Medicine

## 2023-02-20 NOTE — Telephone Encounter (Signed)
Inbound call from patient stating she was recently sent to the ED due to abdominal pain. Patient states he PCP thought she had a urinary tract infection but the ED did a CT scan with contrast and stated patient had bowel build up. Patient states she does not think that it is bowel because she has been taking dulcolax and it is mostly water. States she is still in pain and shaky. Patient requesting a call back to discuss further. Please advise, thank you.

## 2023-02-20 NOTE — Telephone Encounter (Signed)
Would do a bowel purge in the event she is having overflow diarrhea MiraLax bowel purge Then resume Linzess and if not effective increase to 290 mcg daily

## 2023-02-20 NOTE — Telephone Encounter (Signed)
Pt states she was having abd pain and was seen by PCP and urgent care, they thought she had a UTI. Pt states she was sent from her PCP office by ambulance to the ER in . Pt reports they did a CT and she was told there was a lot a stool present throughout her bowel. Reports she takes linzess 145 daily and the ER gave her colace 100mg  to take daily. She reports she is passing liquid stool. She is still having constant lower abd pain. Pt states she is nauseous & shaky, she has passed 2 liquid stools. Pt wants to know if Dr. Bettyjane Morris has any other recommendations. Please advise.

## 2023-02-20 NOTE — Telephone Encounter (Signed)
Spoke with pt and she states she can't take miralax because it makes her itch all over. Instructed pt to take a bottle of mag citrate and drink half of it, wait an hour and then drink the second half of the bottle. She knows to resume the linzess 145 and not effective to take 2 pills(262mcg) daily. Instructed her to call the office if she needed to take and we could send in a new prescription. Dr. Jemimah Belton notified.

## 2023-02-24 NOTE — Telephone Encounter (Signed)
Repeat MagCitrate x 2 however if abdominal pain worsens or fevers develop then to ED.  Further recommendations per Dr. Indiya Belton when he returns tomorrow.

## 2023-02-24 NOTE — Telephone Encounter (Signed)
PT is calling to discuss other options for relief. The previous recommendations have not helped her at all. Please advise.

## 2023-02-24 NOTE — Telephone Encounter (Signed)
Pyrtle pt that called Friday with constipation. It was recommended she do a miralax purge but she stated she cannot take miralax as it makes her itch. She drank mag citrate and reports she passed a little bit of stool and water.States she has bad nausea and she is shaking, freezing, and sweating. She took her temp and it is 99.6. She reports she has bad pains all across her lower abdomen.Reports she is passing gas and is able to eat and drink. She continues to take of linzess in the am. As DOD please advise.

## 2023-02-24 NOTE — Telephone Encounter (Signed)
Pt aware of recommendations per Dr. Russella Dar.

## 2023-02-25 NOTE — Telephone Encounter (Signed)
Inbound call from patient stating that she tried the MagCitrate yesterday and she had watery stool. Patient stated that she is not any better and is requesting a call to discuss. Please advise.

## 2023-02-25 NOTE — Telephone Encounter (Signed)
Pt states she is still just passing just liquid. She wanted Dr. Sue Belton to look at the CT scan that was done at [facility]. Faxed request to HIM dept for them to fax the CT. Will await report.

## 2023-02-26 NOTE — Telephone Encounter (Signed)
Dr. Kortne Belton have you seen the CT on this pt yet? I faxed over a request for it yesterday.

## 2023-02-26 NOTE — Telephone Encounter (Signed)
Pt states she passed some brown material that was furry. Discussed with her that sometimes you can pass mucous in your stool. Pt wanted to know if Dr. Kaytelynn Belton had been able to review her CT from [facility]. Let her know we will call her back when he has reviewed it.

## 2023-02-26 NOTE — Telephone Encounter (Signed)
Patient called stating that she had something "fluffy" come out of her rectum. Patient is requesting a call back to discuss. Please advise.

## 2023-02-27 NOTE — Telephone Encounter (Signed)
CT scan received by fax and put in Dr Lauro Franklin inbox.

## 2023-02-27 NOTE — Telephone Encounter (Signed)
I am still waiting to see CT results, I checked my inbox at lunchtime and I don't have it yet

## 2023-02-28 ENCOUNTER — Telehealth: Payer: Self-pay

## 2023-02-28 DIAGNOSIS — K529 Noninfective gastroenteritis and colitis, unspecified: Secondary | ICD-10-CM

## 2023-02-28 NOTE — Telephone Encounter (Signed)
Pt aware of results and recommendations per Dr. Ashlley Belton. She will get her husband to come to the lab to get the collection kits. Orders in epic.

## 2023-02-28 NOTE — Telephone Encounter (Signed)
-----   Message from Carie Caddy Pyrtle sent at 02/27/2023  4:34 PM EDT ----- Bonita Quin, I have reviewed the CT scan performed for Sierra Morris at [facility] CT scan of the abdomen pelvis with contrast was performed on 02/18/2023 for pelvic pain  This exam shows the colon to be diffusely full of stool.  There was a redundant transverse colon but no obstruction.  The stomach and small bowel were nondilated.  Previous cholecystectomy noted. No other acute findings  We have previously recommended a bowel purge with MiraLAX.  After this she needs to resume her daily laxatives. She reports that she had had a change in the character of her stools and that they were "furry". Reasonable to send stool culture, C. difficile and ova and parasite

## 2023-03-03 ENCOUNTER — Other Ambulatory Visit: Payer: Medicare Other

## 2023-03-03 ENCOUNTER — Other Ambulatory Visit: Payer: Self-pay | Admitting: Internal Medicine

## 2023-03-03 DIAGNOSIS — K529 Noninfective gastroenteritis and colitis, unspecified: Secondary | ICD-10-CM

## 2023-03-04 LAB — CLOSTRIDIUM DIFFICILE BY PCR: Toxigenic C. Difficile by PCR: NEGATIVE

## 2023-03-07 LAB — STOOL CULTURE: E coli, Shiga toxin Assay: NEGATIVE

## 2023-03-07 LAB — OVA AND PARASITE EXAMINATION

## 2023-03-11 ENCOUNTER — Other Ambulatory Visit: Payer: Self-pay | Admitting: Internal Medicine

## 2023-03-13 NOTE — Telephone Encounter (Signed)
Dr Demetrius Charity-  Patient requesting stool study results. Please review. I see O&P wasn't done as there was no specimen received from lab.   Please advise.Marland KitchenMarland Kitchen

## 2023-03-13 NOTE — Telephone Encounter (Signed)
Patient is requesting a call to discuss stool sample findings. Please advise.

## 2023-03-13 NOTE — Telephone Encounter (Signed)
Patient is advised that stool studies are negative/normal. She continues to describe "furry, abnormal looking" stools. Also states she is having diarrhea. Is taking Linzess 145 mcg daily and says this does help her bowels move but she also continues with lower back and lower abdominal discomfort (for which she uses her MS pain medications to help). Patient would like to discuss her symptoms with Dr Syrianna Belton. Appointment made for next availability 07/15/23. Patient verbalizes understanding.

## 2023-03-13 NOTE — Telephone Encounter (Signed)
Stool studies were negative I recall ova and parasite being negative and another separate lab if I am not mistaken? JMP

## 2023-05-13 ENCOUNTER — Telehealth: Payer: Self-pay | Admitting: Pharmacy Technician

## 2023-05-13 ENCOUNTER — Other Ambulatory Visit: Payer: Self-pay

## 2023-05-13 NOTE — Telephone Encounter (Signed)
Dr. Marcelle Overlie,  [facility] note:  patient will be scheduled as soon as possible.  Auth Submission: NO AUTH NEEDED Site of care: Site of care: [facility] Payer: medicare a/b Medication & CPT/J Code(s) submitted: Reclast (Zolendronic acid) [id number] Route of submission (phone, fax, portal):  Phone # Fax # Auth type: HB Units/visits requested: 1 Reference number:  Approval from: 05/13/23 to 07/08/23

## 2023-05-28 ENCOUNTER — Ambulatory Visit: Payer: Medicare Other

## 2023-05-28 VITALS — BP 96/61 | HR 67 | Temp 98.2°F | Resp 18 | Ht 60.0 in | Wt 101.0 lb

## 2023-05-28 DIAGNOSIS — M810 Age-related osteoporosis without current pathological fracture: Secondary | ICD-10-CM

## 2023-05-28 MED ORDER — DIPHENHYDRAMINE HCL 25 MG PO CAPS: 25.0000 mg | ORAL_CAPSULE | Freq: Once | ORAL | Status: DC

## 2023-05-28 MED ORDER — ZOLEDRONIC ACID 5 MG/100ML IV SOLN
5.0000 mg | Freq: Once | INTRAVENOUS | Status: AC
  Administered 2023-05-28: 5 mg via INTRAVENOUS
  Filled 2023-05-28: qty 100

## 2023-05-28 MED ORDER — ACETAMINOPHEN 325 MG PO TABS
650.0000 mg | ORAL_TABLET | Freq: Once | ORAL | Status: AC
  Administered 2023-05-28: 650 mg via ORAL
  Filled 2023-05-28: qty 2

## 2023-05-28 NOTE — Progress Notes (Signed)
Diagnosis: Osteoporosis  Provider:  Chilton Greathouse MD  Procedure: IV Infusion  IV Type: Peripheral, IV Location: R Antecubital  Reclast (Zolendronic Acid), Dose: 5 mg  Infusion Start Time: 1436  Infusion Stop Time: 1504  Post Infusion IV Care: Observation period completed and Peripheral IV Discontinued  Discharge: Condition: Good, Destination: Home . AVS Provided  Performed by:  Wyvonne Lenz, RN

## 2023-07-15 ENCOUNTER — Encounter: Payer: Self-pay | Admitting: Internal Medicine

## 2023-07-15 ENCOUNTER — Ambulatory Visit: Payer: Medicare Other | Admitting: Internal Medicine

## 2023-07-15 VITALS — BP 100/70 | HR 107 | Ht 60.0 in | Wt 100.0 lb

## 2023-07-15 DIAGNOSIS — G35 Multiple sclerosis: Secondary | ICD-10-CM

## 2023-07-15 DIAGNOSIS — K5909 Other constipation: Secondary | ICD-10-CM | POA: Diagnosis not present

## 2023-07-15 DIAGNOSIS — G8929 Other chronic pain: Secondary | ICD-10-CM | POA: Diagnosis not present

## 2023-07-15 DIAGNOSIS — K5900 Constipation, unspecified: Secondary | ICD-10-CM

## 2023-07-15 DIAGNOSIS — R110 Nausea: Secondary | ICD-10-CM | POA: Diagnosis not present

## 2023-07-15 DIAGNOSIS — K219 Gastro-esophageal reflux disease without esophagitis: Secondary | ICD-10-CM | POA: Diagnosis not present

## 2023-07-15 DIAGNOSIS — R194 Change in bowel habit: Secondary | ICD-10-CM

## 2023-07-15 MED ORDER — PANTOPRAZOLE SODIUM 40 MG PO TBEC: 40.0000 mg | DELAYED_RELEASE_TABLET | Freq: Every day | ORAL | 1 refills | Status: DC

## 2023-07-15 NOTE — Patient Instructions (Signed)
 We have sent the following medications to your pharmacy for you to pick up at your convenience: pantoprazole  40 mg daily x 2 months.   Take your Linzess  145 mcg every day.   Please MyChart message our office if your nausea does not improve with these regimens.   _______________________________________________________  If your blood pressure at your visit was 140/90 or greater, please contact your primary care physician to follow up on this.  _______________________________________________________  If you are age 63 or older, your body mass index should be between 23-30. Your Body mass index is 19.53 kg/m. If this is out of the aforementioned range listed, please consider follow up with your Primary Care Provider.  If you are age 73 or younger, your body mass index should be between 19-25. Your Body mass index is 19.53 kg/m. If this is out of the aformentioned range listed, please consider follow up with your Primary Care Provider.   ________________________________________________________  The [facility] providers would like to encourage you to use MYCHART to communicate with providers for non-urgent requests or questions.  Due to long hold times on the telephone, sending your provider a message by [facility] may be a faster and more efficient way to get a response.  Please allow 48 business hours for a response.  Please remember that this is for non-urgent requests.  _______________________________________________________

## 2023-07-15 NOTE — Progress Notes (Signed)
   Subjective:    Patient ID: Sierra Morris, female    DOB: 1961-03-23, 63 y.o.   MRN: [id number]  HPI Sierra Morris is a 63 year old female with a history of adenomatous colon polyps, diverticulosis with history of prior diverticulitis, chronic constipation, MS, GERD, history of fibromyalgia and cervical disc disease who is here for follow-up.  She was here at the time of her colonoscopy on 02/11/2022.  She is here alone today.   Chief complaint of abdominal discomfort and severe nausea persisting since the summer. She describes a history of fluctuating bowel habits, initially presenting with diarrhea but more chronically constipation, which was managed with Linzess . However, she reports a recent shift towards constipation, describing her stools as 'hard and round.' She also notes the ability to palpate and manipulate larger, movable masses in her abdomen, which she believes may be related to her stool consistency.  In addition to her gastrointestinal symptoms, the patient reports a significant increase in headache frequency and leg pain, for which she has been regularly taking hydrocodone . She also mentions a persistent, unpleasant taste in her mouth, which she believes may be related to reflux.  The patient also notes a recent change in her neurological status, with her neurologist suggesting a possible diagnosis of Parkinson's disease. She is currently on Zeposia for her MS, which she has been taking for over a year.  Review of Systems As per HPI, otherwise negative  Current Medications, Allergies, Past Medical History, Past Surgical History, Family History and Social History were reviewed in [employer] record.    Objective:   Physical Exam BP 100/70 (BP Location: Left Arm, Patient Position: Sitting, Cuff Size: Normal)   Pulse (!) 107   Ht 5' (1.524 m)   Wt 100 lb (45.4 kg)   SpO2 98%   BMI 19.53 kg/m  Gen: awake, alert, NAD HEENT: anicteric  Abd: soft, thin,  NT/ND, +BS throughout, some palpable stool in the left colon Ext: no c/c/e Neuro: nonfocal      Assessment & Plan:  63 year old female with a history of adenomatous colon polyps, diverticulosis with history of prior diverticulitis, chronic constipation, MS, GERD, history of fibromyalgia and cervical disc disease who is here for follow-up.    Chronic Constipation and history of chronic left-sided abdominal pain Patient reports hard, round stools and abdominal discomfort. Currently using Linzess  intermittently.  Exacerbated by hydrocodone  -Increase Linzess  to daily use to improve bowel movements and potentially alleviate abdominal discomfort.  Gastroesophageal Reflux Disease (GERD) with nausea Patient reports persistent nausea and a bad taste in the mouth, suggestive of possible reflux. -Start Pantoprazole  40 mg once daily for two months to reduce acid production and potentially alleviate nausea. Patient to report via MyChart if symptoms improve. -Continue current Zofran  prescription as needed; hopefully PPI will help nausea and allow reduction and Zofran  given that Zofran  can be constipating for some patients  Chronic Pain Patient reports regular use of Hydrocodone  for pain control, which may be contributing to constipation. -Continue Hydrocodone  as needed for pain control, while monitoring for potential exacerbation of constipation.  Multiple Sclerosis (MS) Patient is on Zeposia for MS management. No new symptoms reported. -Continue Zeposia as prescribed.  Follow-up If nausea does not improve with Pantoprazole , consider scheduling an upper endoscopy without necessitating another appointment.  30 minutes total spent today including patient facing time, coordination of care, reviewing medical history/procedures/pertinent radiology studies, and documentation of the encounter.

## 2023-07-22 ENCOUNTER — Other Ambulatory Visit: Payer: Self-pay | Admitting: Internal Medicine

## 2023-08-06 ENCOUNTER — Other Ambulatory Visit: Payer: Self-pay | Admitting: Internal Medicine

## 2023-08-19 ENCOUNTER — Other Ambulatory Visit: Payer: Self-pay | Admitting: Internal Medicine

## 2023-09-03 ENCOUNTER — Other Ambulatory Visit: Payer: Self-pay | Admitting: Nurse Practitioner

## 2023-09-03 DIAGNOSIS — G35 Multiple sclerosis: Secondary | ICD-10-CM

## 2023-09-03 DIAGNOSIS — G500 Trigeminal neuralgia: Secondary | ICD-10-CM

## 2023-09-03 DIAGNOSIS — G8929 Other chronic pain: Secondary | ICD-10-CM

## 2023-09-26 ENCOUNTER — Ambulatory Visit (HOSPITAL_BASED_OUTPATIENT_CLINIC_OR_DEPARTMENT_OTHER)
Admission: EM | Admit: 2023-09-26 | Discharge: 2023-09-26 | Disposition: A | Discharge status: Home / Self Care | Attending: Family Medicine | Admitting: Family Medicine

## 2023-09-26 ENCOUNTER — Other Ambulatory Visit (HOSPITAL_BASED_OUTPATIENT_CLINIC_OR_DEPARTMENT_OTHER): Admitting: Radiology

## 2023-09-26 ENCOUNTER — Encounter (HOSPITAL_BASED_OUTPATIENT_CLINIC_OR_DEPARTMENT_OTHER): Payer: Self-pay | Admitting: Emergency Medicine

## 2023-09-26 DIAGNOSIS — I959 Hypotension, unspecified: Secondary | ICD-10-CM

## 2023-09-26 DIAGNOSIS — R1012 Left upper quadrant pain: Secondary | ICD-10-CM | POA: Diagnosis not present

## 2023-09-26 DIAGNOSIS — G35 Multiple sclerosis: Secondary | ICD-10-CM

## 2023-09-26 DIAGNOSIS — R102 Pelvic and perineal pain: Secondary | ICD-10-CM

## 2023-09-26 DIAGNOSIS — R531 Weakness: Secondary | ICD-10-CM

## 2023-09-26 DIAGNOSIS — R112 Nausea with vomiting, unspecified: Secondary | ICD-10-CM

## 2023-09-26 DIAGNOSIS — R1032 Left lower quadrant pain: Secondary | ICD-10-CM

## 2023-09-26 DIAGNOSIS — K5909 Other constipation: Secondary | ICD-10-CM

## 2023-09-26 LAB — POCT URINALYSIS DIP (MANUAL ENTRY)
Bilirubin, UA: NEGATIVE
Blood, UA: NEGATIVE
Glucose, UA: NEGATIVE mg/dL
Ketones, POC UA: NEGATIVE mg/dL
Leukocytes, UA: NEGATIVE
Nitrite, UA: NEGATIVE
Protein Ur, POC: NEGATIVE mg/dL
Spec Grav, UA: 1.025
Urobilinogen, UA: 0.2 U/dL
pH, UA: 6.5

## 2023-09-26 LAB — TSH+FREE T4
Free T4: 0.79 ng/dL
TSH: 0.46 (ref 0.41–5.90)

## 2023-09-26 MED ORDER — SODIUM CHLORIDE 0.9 % IV SOLN: Freq: Once | INTRAVENOUS | Status: AC

## 2023-09-26 NOTE — ED Triage Notes (Signed)
 Pt reports nausea and vomiting x 2 weeks. Thought it was bc she started a new medication but she has not taken the medication x 1 week no better.

## 2023-09-26 NOTE — ED Notes (Signed)
 Patient is being discharged from the Urgent Care and sent to the Emergency Department via EMS . Per Prescilla Sours NP, patient is in need of higher level of care due to syncope and abnormal vital signs. Patient is aware and verbalizes understanding of plan of care.  Vitals:   09/26/23 1233 09/26/23 1240  BP: 110/70 110/75  Pulse:  72  Resp:  18  Temp:  98.8 F (37.1 C)  SpO2:  96%

## 2023-09-26 NOTE — Discharge Instructions (Addendum)
 The patient presented with weakness, persistent nausea for over 2 weeks, worsening tremor, spontaneous vomiting, facial pressure, dizziness.  She feels like she has been eating and drinking but during the visit she had a syncopal episode and her pressure was down to 63/39 with a pulse of 54.  She was only out for a few seconds.  We put her flat and then we raised her legs but her blood pressure is only consistently in the 80-110/40-60.  Have called EMS for transport to the emergency room for workup of hypotension, possible dehydration.  Have started in IV fluid bag for hydration and to provide a line if medications are needed.

## 2023-09-26 NOTE — ED Provider Notes (Addendum)
 [facility]    CSN: [id number] Arrival date & time: 09/26/23  1150      History   Chief Complaint No chief complaint on file.   HPI Sierra Morris is a 63 y.o. female.   The patient has MS and sees neurology and gastroenterology.  She has chronic constipation and uses Linzess 145 mg but only uses it when she feels significantly constipated.  She has been having a bowel movement daily but still feels constipated.  She did have a moderately good bowel movement today.  She is having left upper quadrant and left lower quadrant abdominal pain, suprapubic abdominal pain, chronic nausea and some vomiting.  The nausea has lasted since early March 2025.  The abdominal pain has developed since 09/15/2023.  She denies any urinary symptoms.  She denies fever, chills, cough, diarrhea.  She has had some nasal congestion and sinus pressure but she has not producing any mucus.  She has a chronic tremor that seems worse in the last 3 weeks.  She is also got more weakness in the last 3 weeks.     Past Medical History:  Diagnosis Date   Anorexia nervosa    patient denies   Anxiety    Arthritis    Chronic depression    Chronic headaches    Colitis    Constipation due to opioid therapy    Diverticulosis    Emphysema/COPD [facility])    patient unaware   Fibromyalgia    GERD (gastroesophageal reflux disease)    History of blood in urine    Hyperthyroidism    borderline per patient   MS (multiple sclerosis) (HCC)    Osteoarthritis    Osteoporosis    Pituitary microadenoma (HCC)    PONV (postoperative nausea and vomiting)    Seizure (HCC)    Sleep disorder    Tremor    Tubular adenoma of colon     Patient Active Problem List   Diagnosis Date Noted   Senile osteoporosis 04/22/2022   Adenomatous colon polyp 02/07/2015   Diverticulosis of colon without hemorrhage 02/07/2015   Chronic constipation 02/07/2015   Pelvic pain in female 11/30/2014    Past Surgical History:  Procedure  Laterality Date   ABDOMINAL HYSTERECTOMY  2008   APPENDECTOMY     CESAREAN SECTION     x 2   CHOLECYSTECTOMY     COLONOSCOPY     DENTAL SURGERY     LAPAROSCOPIC BILATERAL SALPINGO OOPHERECTOMY Bilateral 11/30/2014   Procedure: LAPAROSCOPIC BILATERAL SALPINGO OOPHORECTOMY;  Surgeon: Richarda Overlie, MD;  Location: [facility];  Service: Gynecology;  Laterality: Bilateral;  converted to open   LAPAROSCOPY     LAPAROTOMY N/A 11/30/2014   Procedure: POSSIBLE EXPLORATORY LAPAROTOMY, ;  Surgeon: Richarda Overlie, MD;  Location: [facility];  Service: Gynecology;  Laterality: N/A;   NECK SURGERY     OOPHORECTOMY     SALPINGOOPHORECTOMY Bilateral 11/30/2014   Procedure: BILATERAL SALPINGO OOPHORECTOMY;  Surgeon: Richarda Overlie, MD;  Location: [facility];  Service: Gynecology;  Laterality: Bilateral;   TONSILLECTOMY     tumor breast bone removal benign      OB History     Gravida  3   Para  2   Term  2   Preterm      AB  1   Living  3      SAB      IAB      Ectopic  1   Multiple  1  Live Births  3            Home Medications    Prior to Admission medications   Medication Sig Start Date End Date Taking? Authorizing Provider  clonazePAM (KLONOPIN) 0.5 MG tablet Take 0.5 mg by mouth daily.    Yes [provider]  dicyclomine (BENTYL) 10 MG capsule TAKE 1-2 CAPSULE BY MOUTH THREE TIMES DAILY AS NEEDED.PLEASE SCHEDULE A YEARLY F/U FOR FURTHER REFILLS. 04/15/22  Yes Pyrtle, Carie Caddy, MD  FLUoxetine (PROZAC) 20 MG capsule Take 20 mg by mouth daily. 07/07/23  Yes [provider]  HYDROcodone-acetaminophen (NORCO) 10-325 MG tablet Take 1 tablet by mouth every 6 (six) hours as needed.   Yes [provider]  hydrOXYzine (ATARAX/VISTARIL) 25 MG tablet Take 25 mg by mouth as needed for itching.  05/24/15  Yes [provider]  linaclotide (LINZESS) 145 MCG CAPS capsule TAKE 1 CAPSULE BY MOUTH EVERY DAY BEFORE BREAKFAST 08/19/23  Yes Pyrtle, Carie Caddy, MD   methylphenidate (RITALIN) 20 MG tablet Take 20 mg by mouth 2 (two) times daily. 12/26/15  Yes [provider]  ondansetron (ZOFRAN-ODT) 8 MG disintegrating tablet Take 8 mg by mouth every 8 (eight) hours as needed for nausea. 08/15/14  Yes [provider]  ZEPOSIA 0.92 MG CAPS Take 1 capsule by mouth daily. 07/10/23  Yes [provider]  estrogen-methylTESTOSTERone 0.625-1.25 MG tablet Take 1 tablet by mouth every other day.    [provider]  Ozanimod HCl (ZEPOSIA STARTER KIT PO) Take 1 tablet by mouth daily.    [provider]  pantoprazole (PROTONIX) 40 MG tablet TAKE 1 TABLET BY MOUTH EVERY DAY 08/06/23   Pyrtle, Carie Caddy, MD  diphenoxylate-atropine (LOMOTIL) 2.5-0.025 MG tablet SMARTSIG:1 Tablet(s) By Mouth 1-4 Times Daily 01/03/22   [provider]    Family History Family History  Problem Relation Age of Onset   Colon polyps Mother    Hypertension Mother    Depression Father    Diabetes Father    Colon polyps Father    COPD Father    Diabetes Maternal Grandmother    Colon cancer Paternal Grandmother    Rectal cancer Paternal Grandmother    Pancreatitis Son    Irritable bowel syndrome Other        cousin   Esophageal cancer Neg Hx    Stomach cancer Neg Hx     Social History Social History   Tobacco Use   Smoking status: Some Days    Current packs/day: 0.25    Average packs/day: 0.3 packs/day for 0.5 years (0.1 ttl pk-yrs)    Types: Cigarettes   Smokeless tobacco: Never  Vaping Use   Vaping status: Some Days  Substance Use Topics   Alcohol use: Yes    Alcohol/week: 0.0 standard drinks of alcohol    Comment: rare   Drug use: No     Allergies   Wheat, Prednisone, Aubagio [teriflunomide], Benadryl [diphenhydramine], Buprenorphine hcl-naloxone hcl, Paroxetine hcl, Paroxetine hcl, Pregabalin, Suboxone [buprenorphine hcl-naloxone hcl], and Polyethylene glycol   Review of Systems Review of Systems  Constitutional:   Negative for chills and fever.  HENT:  Positive for sinus pressure and sinus pain. Negative for ear pain, postnasal drip, rhinorrhea and sore throat.   Eyes:  Negative for pain and visual disturbance.  Respiratory:  Negative for cough and shortness of breath.   Cardiovascular:  Negative for chest pain and palpitations.  Gastrointestinal:  Positive for abdominal pain, constipation, nausea and vomiting. Negative  for diarrhea.  Genitourinary:  Negative for dysuria and hematuria.  Musculoskeletal:  Negative for arthralgias and back pain.  Skin:  Negative for color change and rash.  Neurological:  Positive for tremors, weakness and numbness. Negative for seizures and syncope.  All other systems reviewed and are negative.    Physical Exam Triage Vital Signs ED Triage Vitals  Encounter Vitals Group     BP 09/26/23 1200 115/78     Systolic BP Percentile --      Diastolic BP Percentile --      Pulse Rate 09/26/23 1200 95     Resp 09/26/23 1200 18     Temp 09/26/23 1200 98.8 F (37.1 C)     Temp Source 09/26/23 1200 Oral     SpO2 09/26/23 1200 97 %     Weight --      Height --      Head Circumference --      Peak Flow --      Pain Score 09/26/23 1158 0     Pain Loc --      Pain Education --      Exclude from Growth Chart --    No data found.  Updated Vital Signs BP 110/75 (BP Location: Left Arm)   Pulse 72   Temp 98.8 F (37.1 C) (Oral)   Resp 18   SpO2 96%   Visual Acuity Right Eye Distance:   Left Eye Distance:   Bilateral Distance:    Right Eye Near:   Left Eye Near:    Bilateral Near:     Physical Exam Vitals and nursing note reviewed.  Constitutional:      General: She is not in acute distress.    Appearance: She is well-developed. She is not ill-appearing or toxic-appearing.  HENT:     Head: Normocephalic and atraumatic.     Right Ear: Hearing, tympanic membrane, ear canal and external ear normal.     Left Ear: Hearing, tympanic membrane, ear canal and  external ear normal.     Nose: No congestion or rhinorrhea.     Right Sinus: Maxillary sinus tenderness (mild) present. No frontal sinus tenderness.     Left Sinus: Maxillary sinus tenderness (mild) present. No frontal sinus tenderness.     Mouth/Throat:     Lips: Pink.     Mouth: Mucous membranes are moist.     Pharynx: Uvula midline. No oropharyngeal exudate or posterior oropharyngeal erythema.     Tonsils: No tonsillar exudate.  Eyes:     Conjunctiva/sclera: Conjunctivae normal.     Pupils: Pupils are equal, round, and reactive to light.  Cardiovascular:     Rate and Rhythm: Normal rate and regular rhythm.     Heart sounds: S1 normal and S2 normal. No murmur heard. Pulmonary:     Effort: Pulmonary effort is normal. No respiratory distress.     Breath sounds: Normal breath sounds. No decreased breath sounds, wheezing, rhonchi or rales.  Abdominal:     General: Bowel sounds are normal.     Palpations: Abdomen is soft.     Tenderness: There is abdominal tenderness (mild to moderate) in the suprapubic area, left upper quadrant and left lower quadrant. There is no right CVA tenderness, left CVA tenderness, guarding or rebound. Negative signs include Murphy's sign, Rovsing's sign and McBurney's sign.  Musculoskeletal:        General: No swelling.     Cervical back: Neck supple.  Lymphadenopathy:     Head:  Right side of head: No submental, submandibular, tonsillar, preauricular or posterior auricular adenopathy.     Left side of head: No submental, submandibular, tonsillar, preauricular or posterior auricular adenopathy.     Cervical: No cervical adenopathy.     Right cervical: No superficial cervical adenopathy.    Left cervical: No superficial cervical adenopathy.  Skin:    General: Skin is warm and dry.     Capillary Refill: Capillary refill takes less than 2 seconds.     Findings: No rash.  Neurological:     Mental Status: She is alert and oriented to person, place, and time.      Motor: Weakness and tremor (fine, non-intentional, whole body) present.  Psychiatric:        Mood and Affect: Mood normal.      UC Treatments / Results  Labs (all labs ordered are listed, but only abnormal results are displayed) Labs Reviewed  POCT URINALYSIS DIP (MANUAL ENTRY) - Normal    EKG   Radiology No results found.  Procedures Procedures (including critical care time)  Medications Ordered in UC Medications  0.9 %  sodium chloride infusion ( Intravenous New Bag/Given 09/26/23 1240)    Initial Impression / Assessment and Plan / UC Course  I have reviewed the triage vital signs and the nursing notes.  Pertinent labs & imaging results that were available during my care of the patient were reviewed by me and considered in my medical decision making (see chart for details).     Patient has had persistent nausea for 2 weeks is her chief complaint.  She has MS and other chronic health problems including chronic constipation.  During the visit she had a syncopal episode and her blood pressure was 63/39.  We have her flat with her legs up in the air and her blood pressure is still 80-110/40-60.  Have contacted EMS to transport the patient to a hospital for further evaluation of her hypotension, syncope, weakness, persistent nausea.  I am concerned that the nausea is actually secondary to persistence hypotension in the last 2 weeks.  She had been able to eat and drink but then would periodically vomit and would vomit without any significant warning.  I think that could be signs that she was very hypotensive at times.  Have started an IV to give her fluids and to provide access if she needs it.  Follow-up here as needed.  The patient was under my care for over 50 minutes and left with EMS at 12:50 pm. Final Clinical Impressions(s) / UC Diagnoses   Final diagnoses:  Left upper quadrant abdominal pain  Left lower quadrant abdominal pain  Suprapubic abdominal pain  Chronic  constipation  MS (multiple sclerosis) (HCC)  Nausea and vomiting, unspecified vomiting type  Hypotension, unspecified hypotension type  Weakness     Discharge Instructions      The patient presented with weakness, persistent nausea for over 2 weeks, worsening tremor, spontaneous vomiting, facial pressure, dizziness.  She feels like she has been eating and drinking but during the visit she had a syncopal episode and her pressure was down to 63/39 with a pulse of 54.  She was only out for a few seconds.  We put her flat and then we raised her legs but her blood pressure is only consistently in the 80-110/40-60.  Have called EMS for transport to the emergency room for workup of hypotension, possible dehydration.  Have started in IV fluid bag for hydration and to provide a  line if medications are needed.     ED Prescriptions   None    PDMP not reviewed this encounter.   Prescilla Sours, FNP 09/26/23 1238    Prescilla Sours, FNP 09/26/23 1250

## 2023-09-27 DIAGNOSIS — I351 Nonrheumatic aortic (valve) insufficiency: Secondary | ICD-10-CM | POA: Diagnosis not present

## 2023-09-27 LAB — COMPREHENSIVE METABOLIC PANEL WITH GFR
EGFR (Non-African Amer.): 101
EGFR (Non-African Amer.): 97

## 2023-10-06 ENCOUNTER — Ambulatory Visit: Admitting: Speech Pathology

## 2023-10-06 ENCOUNTER — Ambulatory Visit

## 2023-10-06 ENCOUNTER — Ambulatory Visit: Admitting: Occupational Therapy

## 2023-10-12 ENCOUNTER — Other Ambulatory Visit

## 2023-10-21 DIAGNOSIS — F17200 Nicotine dependence, unspecified, uncomplicated: Secondary | ICD-10-CM | POA: Insufficient documentation

## 2023-10-21 DIAGNOSIS — I7300 Raynaud's syndrome without gangrene: Secondary | ICD-10-CM | POA: Insufficient documentation

## 2023-10-22 ENCOUNTER — Ambulatory Visit

## 2023-10-22 ENCOUNTER — Encounter: Payer: Self-pay | Admitting: Cardiology

## 2023-10-22 ENCOUNTER — Ambulatory Visit: Attending: Cardiology | Admitting: Cardiology

## 2023-10-22 VITALS — BP 106/74 | HR 106 | Ht 60.0 in | Wt 101.4 lb

## 2023-10-22 DIAGNOSIS — R55 Syncope and collapse: Secondary | ICD-10-CM | POA: Diagnosis present

## 2023-10-22 DIAGNOSIS — R000 Tachycardia, unspecified: Secondary | ICD-10-CM

## 2023-10-22 DIAGNOSIS — G35 Multiple sclerosis: Secondary | ICD-10-CM | POA: Insufficient documentation

## 2023-10-22 DIAGNOSIS — F32A Depression, unspecified: Secondary | ICD-10-CM | POA: Diagnosis present

## 2023-10-22 DIAGNOSIS — Z8249 Family history of ischemic heart disease and other diseases of the circulatory system: Secondary | ICD-10-CM | POA: Insufficient documentation

## 2023-10-22 NOTE — Patient Instructions (Signed)
 Medication Instructions:  Your physician recommends that you continue on your current medications as directed. Please refer to the Current Medication list given to you today.  *If you need a refill on your cardiac medications before your next appointment, please call your pharmacy*  Lab Work: NONE If you have labs (blood work) drawn today and your tests are completely normal, you will receive your results only by: MyChart Message (if you have MyChart) OR A paper copy in the mail If you have any lab test that is abnormal or we need to change your treatment, we will call you to review the results.  Testing/Procedures: You have been asked to wear a Zio Heart Monitor today. It is to be worn for 14 days. Please remove the monitor on 4/30 and mail back in the box provided.  If you have any questions about the monitor please call the company at [phone]    We will order CT coronary calcium score. It will cost $99.00 and is due at time of scan.  Please call to schedule.    [facility] [address] [place], [place] [address] [phone]   Follow-Up: At [facility], you and your health needs are our priority.  As part of our continuing mission to provide you with exceptional heart care, our providers are all part of one team.  This team includes your primary Cardiologist (physician) and Advanced Practice Providers or APPs (Physician Assistants and Nurse Practitioners) who all work together to provide you with the care you need, when you need it.  Your next appointment:   2 month(s)  Provider:   Ralene Burger, MD    We recommend signing up for the patient portal called "MyChart".  Sign up information is provided on this After Visit Summary.  MyChart is used to connect with patients for Virtual Visits (Telemedicine).  Patients are able to view lab/test results, encounter notes, upcoming appointments, etc.  Non-urgent messages can be  sent to your provider as well.   To learn more about what you can do with MyChart, go to [email or web address].   Other Instructions

## 2023-10-22 NOTE — Progress Notes (Signed)
 Cardiology Consultation:    Date:  10/22/2023   ID:  Sierra Morris, DOB Jul 06, 1961, MRN [id number]  PCP:  Harvest Lineman, MD  Cardiologist:  Ralene Burger, MD   Referring MD: Harvest Lineman, MD   No chief complaint on file. I passed out  History of Present Illness:    STEPFANIE Morris is a 63 y.o. female who is being seen today for the evaluation of syncope at the request of Harvest Lineman, MD. past medical history significant for multiple sclerosis apparently she was diagnosed when she was 15 she did have what appears to be amaurosis fugax diagnosis of multiple sclerosis has been made but she was not informed about this, so she was not aware of the problem until 2024 when she started being treated for this condition.  She was referred to us  because of episode of syncope.  Apparently she was in doctor's office she went there because she fell not right, she had nausea but no vomiting no diarrhea she was just about to have blood taken and then she passed out.  She described having multiple episode of syncope previously usually look like vasovagal with some prodromal symptoms however 1 of 2 episodes were without any prodromal symptomatology.  She does not exercise on the regular basis.  She smokes very few cigarettes maybe 5 a week maybe 2 during the weekend does not have family history of premature coronary artery disease, not on any special diet.  She is being treated for her multiple sclerosis  Past Medical History:  Diagnosis Date   Anorexia nervosa    patient denies   Anxiety    Arthritis    Chronic depression    Chronic headaches    Colitis    Constipation due to opioid therapy    Diverticulosis    Emphysema/COPD [facility])    patient unaware   Fibromyalgia    GERD (gastroesophageal reflux disease)    History of blood in urine    Hyperthyroidism    borderline per patient   MS (multiple sclerosis) (HCC)    Osteoarthritis    Osteoporosis    Pituitary microadenoma (HCC)     PONV (postoperative nausea and vomiting)    Seizure (HCC)    Sleep disorder    Tremor    Tubular adenoma of colon     Past Surgical History:  Procedure Laterality Date   ABDOMINAL HYSTERECTOMY  2008   APPENDECTOMY     CESAREAN SECTION     x 2   CHOLECYSTECTOMY     COLONOSCOPY     DENTAL SURGERY     LAPAROSCOPIC BILATERAL SALPINGO OOPHERECTOMY Bilateral 11/30/2014   Procedure: LAPAROSCOPIC BILATERAL SALPINGO OOPHORECTOMY;  Surgeon: Woodrow Hazy, MD;  Location: [facility];  Service: Gynecology;  Laterality: Bilateral;  converted to open   LAPAROSCOPY     LAPAROTOMY N/A 11/30/2014   Procedure: POSSIBLE EXPLORATORY LAPAROTOMY, ;  Surgeon: Woodrow Hazy, MD;  Location: [facility];  Service: Gynecology;  Laterality: N/A;   NECK SURGERY     OOPHORECTOMY     SALPINGOOPHORECTOMY Bilateral 11/30/2014   Procedure: BILATERAL SALPINGO OOPHORECTOMY;  Surgeon: Woodrow Hazy, MD;  Location: [facility];  Service: Gynecology;  Laterality: Bilateral;   TONSILLECTOMY     tumor breast bone removal benign      Current Medications: Current Meds  Medication Sig   baclofen (LIORESAL) 10 MG tablet Take 10 mg by mouth 3 (three) times daily.   clonazePAM (KLONOPIN) 0.5 MG tablet Take 1 mg by mouth  daily.   doxepin (SINEQUAN) 10 MG capsule Take 10 mg by mouth at bedtime as needed.   estrogen-methylTESTOSTERone 0.625-1.25 MG tablet Take 1 tablet by mouth every other day.   FLUoxetine (PROZAC) 20 MG capsule Take 20 mg by mouth daily.   HYDROcodone-acetaminophen (NORCO/VICODIN) 5-325 MG tablet Take 1 tablet by mouth daily.   hydrOXYzine (ATARAX/VISTARIL) 25 MG tablet Take 25 mg by mouth 3 (three) times daily.   methylphenidate (RITALIN) 20 MG tablet Take 20 mg by mouth 2 (two) times daily.   ondansetron (ZOFRAN-ODT) 8 MG disintegrating tablet Take 8 mg by mouth every 8 (eight) hours as needed for nausea.   OXcarbazepine (TRILEPTAL) 150 MG tablet Take 150 mg by mouth 2 (two) times daily.   Ozanimod HCl (ZEPOSIA  STARTER KIT PO) Take 1 tablet by mouth daily.   ZEPOSIA 0.92 MG CAPS Take 1 capsule by mouth daily.   [DISCONTINUED] carisoprodol (SOMA) 350 MG tablet Take 350 mg by mouth 4 (four) times daily as needed for muscle spasms.   [DISCONTINUED] diclofenac (VOLTAREN) 75 MG EC tablet Take 75 mg by mouth daily.   [DISCONTINUED] dicyclomine (BENTYL) 10 MG capsule TAKE 1-2 CAPSULE BY MOUTH THREE TIMES DAILY AS NEEDED.PLEASE SCHEDULE A YEARLY F/U FOR FURTHER REFILLS. (Patient taking differently: Take 10 mg by mouth See admin instructions. Take 1-2 capsule by mouth three times daily as needed.Please schedule a yearly F/U for further refills.)   [DISCONTINUED] Docusate Sodium (DSS) 100 MG CAPS Take 1 capsule by mouth daily.   [DISCONTINUED] escitalopram (LEXAPRO) 10 MG tablet Take 10 mg by mouth daily.   [DISCONTINUED] fluconazole (DIFLUCAN) 150 MG tablet Take 150 mg by mouth once.   [DISCONTINUED] gabapentin (NEURONTIN) 300 MG capsule Take 300 mg by mouth 4 (four) times daily as needed (nerve pain).   [DISCONTINUED] linaclotide (LINZESS) 145 MCG CAPS capsule TAKE 1 CAPSULE BY MOUTH EVERY DAY BEFORE BREAKFAST (Patient taking differently: Take 145 mcg by mouth daily before breakfast.)   [DISCONTINUED] pantoprazole (PROTONIX) 40 MG tablet TAKE 1 TABLET BY MOUTH EVERY DAY   [DISCONTINUED] VRAYLAR 1.5 MG capsule Take 1.5 mg by mouth daily.     Allergies:   Wheat, Prednisone, Aubagio [teriflunomide], Benadryl [diphenhydramine], Buprenorphine hcl-naloxone hcl, Carbidopa, Paroxetine hcl, Paroxetine hcl, Pregabalin, Suboxone [buprenorphine hcl-naloxone hcl], and Polyethylene glycol   Social History   Socioeconomic History   Marital status: Single    Spouse name: Not on file   Number of children: 3   Years of education: Not on file   Highest education level: Not on file  Occupational History   Occupation: disabled   Tobacco Use   Smoking status: Some Days    Current packs/day: 0.25    Average packs/day: 0.3  packs/day for 0.5 years (0.1 ttl pk-yrs)    Types: Cigarettes   Smokeless tobacco: Never  Vaping Use   Vaping status: Some Days  Substance and Sexual Activity   Alcohol use: Yes    Alcohol/week: 0.0 standard drinks of alcohol    Comment: rare   Drug use: No   Sexual activity: Not on file  Other Topics Concern   Not on file  Social History Narrative   Not on file   Social Drivers of Health   Financial Resource Strain: Not on file  Food Insecurity: Not on file  Transportation Needs: Not on file  Physical Activity: Not on file  Stress: Not on file  Social Connections: Not on file     Family History: The patient's family history includes  COPD in her father; Colon cancer in her paternal grandmother; Colon polyps in her father and mother; Depression in her father; Diabetes in her father and maternal grandmother; Hypertension in her mother; Irritable bowel syndrome in an other family member; Pancreatitis in her son; Rectal cancer in her paternal grandmother. There is no history of Esophageal cancer or Stomach cancer. ROS:   Please see the history of present illness.    All 14 point review of systems negative except as described per history of present illness.  EKGs/Labs/Other Studies Reviewed:    The following studies were reviewed today: Echocardiogram done while she was in the hospital showed no significant valvular pathology and normal preserved ejection fraction.  EKG:  EKG Interpretation Date/Time:  Wednesday October 22 2023 10:31:49 EDT Ventricular Rate:  106 PR Interval:  140 QRS Duration:  72 QT Interval:  322 QTC Calculation: 427 R Axis:   13  Text Interpretation: Sinus tachycardia Biatrial enlargement Nonspecific ST abnormality Abnormal ECG No previous ECGs available Confirmed by Gypsy Balsam [phone]) on 10/22/2023 10:34:27 AM    Recent Labs: No results found for requested labs within last 365 days.  Recent Lipid Panel No results found for: "CHOL", "TRIG",  "HDL", "CHOLHDL", "VLDL", "LDLCALC", "LDLDIRECT"  Physical Exam:    VS:  BP 106/74 (BP Location: Right Arm, Patient Position: Sitting)   Pulse (!) 106   Ht 5' (1.524 m)   Wt 101 lb 6.4 oz (46 kg)   SpO2 98%   BMI 19.80 kg/m     Wt Readings from Last 3 Encounters:  10/22/23 101 lb 6.4 oz (46 kg)  07/15/23 100 lb (45.4 kg)  05/28/23 101 lb (45.8 kg)     GEN:  Well nourished, well developed in no acute distress HEENT: Normal NECK: No JVD; No carotid bruits LYMPHATICS: No lymphadenopathy CARDIAC: RRR, no murmurs, no rubs, no gallops RESPIRATORY:  Clear to auscultation without rales, wheezing or rhonchi  ABDOMEN: Soft, non-tender, non-distended MUSCULOSKELETAL:  No edema; No deformity  SKIN: Warm and dry NEUROLOGIC:  Alert and oriented x 3 PSYCHIATRIC:  Normal affect   ASSESSMENT:    1. Tachycardia   2. Family history of early CAD   3. Syncope and collapse   4. Multiple sclerosis (HCC)   5. Chronic depression    PLAN:    In order of problems listed above:  Syncope and collapse, will schedule a Zio patch for 2 weeks to see if she got any significant arrhythmia.  We do have provide assessment for left ventricle ejection fraction and valve structures based on echocardiogram done in the hospital which was negative. Low blood pressure I encouraged her to drink plenty of fluids every single day and also warned her about symptoms of vasovagal and asked her to lay down or sit down to avoid full episode of syncope. Dyslipidemia I did review her lab work test done by primary care physician which were done just few days ago LDL 163 HDL 59.  She told me right away from getting that she does not like to take any new medications.  Will do calcium score to see what her risk stratification will be and then decide how to approach that. Multiple sclerosis that being followed by internal medicine and neurology team   Medication Adjustments/Labs and Tests Ordered: Current medicines are  reviewed at length with the patient today.  Concerns regarding medicines are outlined above.  Orders Placed This Encounter  Procedures   CT CARDIAC SCORING (SELF PAY ONLY)  LONG TERM MONITOR (3-14 DAYS)   EKG 12-Lead   No orders of the defined types were placed in this encounter.   Signed, Manfred Seed, MD, [facility]. 10/22/2023 11:21 AM    [facility]

## 2023-10-24 ENCOUNTER — Other Ambulatory Visit

## 2023-11-05 ENCOUNTER — Encounter: Payer: Self-pay | Admitting: Internal Medicine

## 2023-11-05 ENCOUNTER — Telehealth: Payer: Self-pay | Admitting: Internal Medicine

## 2023-11-05 NOTE — Telephone Encounter (Signed)
 Patient has been scheduled for EGD with Dr. Bridgett Camps on 6/4 at 3:00 and PV 5/21 at 11:00

## 2023-11-05 NOTE — Telephone Encounter (Signed)
 Ok to schedule pt for direct egd with previsit in the [facility] please.

## 2023-11-05 NOTE — Telephone Encounter (Signed)
 Per OV note it looks like pt can be scheduled for an EGD without ov, please advise.

## 2023-11-05 NOTE — Telephone Encounter (Signed)
 Ok for egd in [employer]

## 2023-11-05 NOTE — Telephone Encounter (Signed)
 Inbound call from patient stating that she went to the ER a few weeks ago because she is still having issues with nausea and they wanted her to call us  to make an appointment. Patient states that when she last seen Dr. Bridgett Camps he mentioned her having a EGD. Patient is requesting a call from the nurse to discuss if she needs to be seen first or if she can go ahead and be scheduled for procedure. Please advise.

## 2023-11-10 ENCOUNTER — Ambulatory Visit (HOSPITAL_BASED_OUTPATIENT_CLINIC_OR_DEPARTMENT_OTHER)
Admission: RE | Admit: 2023-11-10 | Discharge: 2023-11-10 | Disposition: A | Discharge status: Home / Self Care | Payer: Self-pay | Source: Ambulatory Visit | Attending: Cardiology | Admitting: Cardiology

## 2023-11-10 DIAGNOSIS — Z8249 Family history of ischemic heart disease and other diseases of the circulatory system: Secondary | ICD-10-CM

## 2023-11-17 DIAGNOSIS — R000 Tachycardia, unspecified: Secondary | ICD-10-CM

## 2023-11-18 ENCOUNTER — Ambulatory Visit: Payer: Self-pay | Admitting: Cardiology

## 2023-11-19 NOTE — Telephone Encounter (Signed)
-----   Message from Ralene Burger sent at 11/13/2023  4:56 PM EDT ----- Calcium score mildly elevated at 83.2, will talk significance of this during next visit

## 2023-11-19 NOTE — Telephone Encounter (Signed)
 Patient notified of results and verbalized understanding.

## 2023-11-26 ENCOUNTER — Ambulatory Visit (AMBULATORY_SURGERY_CENTER)

## 2023-11-26 ENCOUNTER — Telehealth: Payer: Self-pay | Admitting: Internal Medicine

## 2023-11-26 VITALS — Ht 60.0 in | Wt 100.0 lb

## 2023-11-26 DIAGNOSIS — R110 Nausea: Secondary | ICD-10-CM

## 2023-11-26 NOTE — Telephone Encounter (Signed)
 Left vm

## 2023-11-26 NOTE — Progress Notes (Signed)
 No egg or soy allergy known to patient  PONV with past sedation with any surgeries or procedures Patient denies ever being told they had issues or difficulty with intubation  No FH of Malignant Hyperthermia Pt is not on diet pills Pt is not on  home 02  Pt is not on blood thinners  Pt denies issues with constipation  No A fib or A flutter Have any cardiac testing pending--no  LOA: independent   Patient's chart reviewed by Rogena Class CNRA prior to previsit and patient appropriate for the [facility].  Previsit completed and red dot placed by patient's name on their procedure day (on provider's schedule).     PV completed with patient. Prep instructions sent via mychart and home address.

## 2023-11-26 NOTE — Telephone Encounter (Signed)
 Error

## 2023-11-26 NOTE — Telephone Encounter (Signed)
 Pt returned my call. I spoke to pt regarding nausea. Pt states she d/c pantoprazole  and doxepin approximately a few weeks ago due to "I felt like it might have been making my nausea worse".  Pt states her pain is a 9/10, interfering with her daily life to the point that she "can hardly get up and move around". Pt states nausea used to be intermittent, coming and going, but over the past week it has become constant making it difficult to eat. Pt denies vomiting, but at times she feels she is feeling the urge to vomit. She is trying to watch what she eats and eating a bland diet. Pt is getting fluids and keeping them down. Urinating as normal and states "my urine did look really yellow once" a few days ago. Pt denies dizziness, but feels like "my head is always roaring". She has had a couple of headaches recently described as being located around her eyes and toward ear on the right side. Pt indicates she has had a cardiac work up after going to urgent care and having a syncopal episode. Pt states she was having some loose stools for a couple of days, but that has subsided. She denies hematochezia, melena, or mucus in stools. Pt states my last stool look "sand like and was green". Pt states she is having some intermittent neck pain and abdominal pain. Pt says abdominal pain is periumbilical described as cramping and mid back pain that moves upward towards shoulders. She is currently using phenergan  suppositories, Zofran , and drinking pure ginger ale type drinks to help, but has no relief.   Is there anything else that you would recommend? Pt sched for EGD on 12/10/23 at 3. Please advise.  Thanks, [employer] LPN

## 2023-11-26 NOTE — Telephone Encounter (Signed)
 Patient called and stated that she is having more nausea and stated that her Zofran  is no longer helping. Patient is requesting if we are able to add nothing to her Zofran  so that her nausea would go away. Patient is scheduled for a Endoscopy for June the 4 th. Patient is requesting a call back. Please advise.

## 2023-11-27 NOTE — Progress Notes (Signed)
 Brigitte Canard, PA-C [address] [place], [place]  [address] Phone: [phone]   Primary Care Physician: Harvest Lineman, MD  Primary Gastroenterologist:  Brigitte Canard, PA-C / Dr. Laurell Pond   Chief Complaint:  F/U Nausea    HPI:   Sierra Morris is a 63 y.o. female returns evaluation of worsening nausea.  She has history of adenomatous colon polyps, diverticulosis with history of prior diverticulitis, chronic constipation, MS, GERD, history of fibromyalgia and cervical disc disease.  Possible Parkinson's per neurology.  She last saw Dr. Bridgett Camps 07/2023 to evaluate abdominal discomfort and severe nausea for 1 year.  She was started on pantoprazole  40 Mg once daily for acid reflux.  Continue Zofran  as needed for nausea.  EGD was next step if upper GI symptoms persisted.  She also has Irregular bowel habits with diarrhea alternating with constipation.  Treated with Linzess  145 mcg daily.  She takes hydrocodone  for chronic pain (likely exacerbating her constipation and nausea).  On Zeposia for MS.    Current symptoms: Patient states she is having worsening severe nausea with episodes of vomiting in the past month.  She is losing weight.  Unable to eat.  She drinks 1 boost per day.  Taking Zofran  and eating ginger with little benefit.  Vomits food.  She feels really cold and shaky after eating.  She denies any new medications.  She has been on hydrocodone  10 mg daily for 15 years.  She states pantoprazole  made her nausea worse and she discontinued.  She has had cholecystectomy, appendectomy, and hysterectomy.  She is scheduled for EGD with Dr. Bridgett Camps 12/10/2023 to further evaluate worsening nausea and vomiting.  She went to [facility] 1 month ago for nausea.  Had negative cardiac evaluation.  She has not had a recent abdominal pelvic CT.  She admits to generalized abdominal pain.  She is taking Linzess  145 mcg daily and is having a bowel movement daily or every other  day.  02/2022 last colonoscopy: 3 small sessile serrated and tubular adenoma polyps removed.  2 small hyperplastic polyps removed.  Excellent prep.  Mild diverticulosis.  Biopsies negative for microscopic colitis and IBD.  5-year repeat colonoscopy (due 02/2027).  04/2003 last EGD.  Current Outpatient Medications  Medication Sig Dispense Refill   baclofen (LIORESAL) 10 MG tablet Take 10 mg by mouth 3 (three) times daily.     clonazePAM  (KLONOPIN ) 0.5 MG tablet Take 1 mg by mouth daily.     estrogen-methylTESTOSTERone 0.625-1.25 MG tablet Take 1 tablet by mouth every other day.     FLUoxetine (PROZAC) 20 MG capsule Take 20 mg by mouth daily.     HYDROcodone -acetaminophen  (NORCO/VICODIN) 5-325 MG tablet Take 1 tablet by mouth daily.     hydrOXYzine (ATARAX/VISTARIL) 25 MG tablet Take 25 mg by mouth 3 (three) times daily as needed.     LINZESS  145 MCG CAPS capsule Take 145 mcg by mouth every morning.     methylphenidate  (RITALIN ) 20 MG tablet Take 20 mg by mouth 3 (three) times daily.  0   ondansetron  (ZOFRAN ) 8 MG tablet Take 8 mg by mouth 3 (three) times daily.     ondansetron  (ZOFRAN -ODT) 8 MG disintegrating tablet Take 8 mg by mouth every 8 (eight) hours as needed for nausea.  5   ZEPOSIA 0.92 MG CAPS Take 1 capsule by mouth daily.     No current facility-administered medications for this visit.    Allergies as of 11/28/2023 - Review  Complete 11/28/2023  Allergen Reaction Noted   Wheat Shortness Of Breath 11/28/2014   Prednisone Other (See Comments) 02/26/2017   Aubagio [teriflunomide]  10/14/2016   Benadryl  [diphenhydramine ] Itching 01/01/2016   Buprenorphine hcl-naloxone  hcl  02/11/2022   Carbidopa Other (See Comments) 10/22/2023   Paroxetine Other (See Comments) 11/12/2023   Paroxetine hcl Other (See Comments) 06/24/2011   Paroxetine hcl Other (See Comments) 02/11/2022   Pregabalin Itching 06/24/2011   Suboxone [buprenorphine hcl-naloxone  hcl] Other (See Comments) 07/27/2014    Polyethylene glycol Rash and Itching 01/01/2016    Past Medical History:  Diagnosis Date   Anorexia nervosa    patient denies   Anxiety    Arthritis    Chronic depression    Chronic headaches    Colitis    Constipation due to opioid therapy    Diverticulosis    Emphysema/COPD [facility])    patient unaware   Fibromyalgia    GERD (gastroesophageal reflux disease)    History of blood in urine    Hyperthyroidism    borderline per patient   MS (multiple sclerosis) (HCC)    Osteoarthritis    Osteoporosis    Pituitary microadenoma (HCC)    PONV (postoperative nausea and vomiting)    Seizure (HCC)    Sleep disorder    Tremor    Tubular adenoma of colon     Past Surgical History:  Procedure Laterality Date   ABDOMINAL HYSTERECTOMY  2008   APPENDECTOMY     CESAREAN SECTION     x 2   CHOLECYSTECTOMY     COLONOSCOPY     DENTAL SURGERY     LAPAROSCOPIC BILATERAL SALPINGO OOPHERECTOMY Bilateral 11/30/2014   Procedure: LAPAROSCOPIC BILATERAL SALPINGO OOPHORECTOMY;  Surgeon: Woodrow Hazy, MD;  Location: [facility];  Service: Gynecology;  Laterality: Bilateral;  converted to open   LAPAROSCOPY     LAPAROTOMY N/A 11/30/2014   Procedure: POSSIBLE EXPLORATORY LAPAROTOMY, ;  Surgeon: Woodrow Hazy, MD;  Location: [facility];  Service: Gynecology;  Laterality: N/A;   NECK SURGERY     OOPHORECTOMY     SALPINGOOPHORECTOMY Bilateral 11/30/2014   Procedure: BILATERAL SALPINGO OOPHORECTOMY;  Surgeon: Woodrow Hazy, MD;  Location: [facility];  Service: Gynecology;  Laterality: Bilateral;   TONSILLECTOMY     tumor breast bone removal benign      Review of Systems:    All systems reviewed and negative except where noted in HPI.    Physical Exam:  BP 112/64   Pulse 71   Ht 5' (1.524 m)   Wt 99 lb 8 oz (45.1 kg)   BMI 19.43 kg/m  No LMP recorded. Patient has had a hysterectomy.  General: Well-nourished, very thin female, mildly ill-appearing, in no acute distress.  Lungs: Clear to auscultation  bilaterally. Non-labored. Heart: Regular rate and rhythm, no murmurs rubs or gallops.  Abdomen: Bowel sounds are normal; Abdomen is Soft and very thin; No hepatosplenomegaly, masses or hernias; mild to moderate generalized upper and lower abdominal tenderness diffusely.  Abdominal Tenderness; mild guarding; no rebound tenderness. Neuro: Alert and oriented x 3.  Grossly intact.  She is shaky and tremulous. Psych: Alert and cooperative, very anxious and depressed mood and affect.   Imaging Studies: LONG TERM MONITOR (3-14 DAYS) Result Date: 11/17/2023 Patch Wear Time:  13 days and 22 hours (2025-04-16T11:07:34-0400 to 2025-04-30T09:27:43-0400) Patient had a min HR of 56 bpm, max HR of 143 bpm, and avg HR of 83 bpm. Predominant underlying rhythm was Sinus Rhythm. Isolated SVEs were rare (<  1.0%), SVE Couplets were rare (<1.0%), and no SVE Triplets were present. Isolated VEs were rare (<1.0%), VE  Couplets were rare (<1.0%), and no VE Triplets were present. Summary and conclusions: Normal monitor   CT CARDIAC SCORING (SELF PAY ONLY) Result Date: 11/13/2023 EXAM: OVER-READ INTERPRETATION CT CHEST The following report is an over-read performed by radiologist Dr. Edrie Gower of [facility] on 11/13/2023 6:12 PM CDT. This over-read does not include interpretation of cardiac or coronary anatomy or pathology. The coronary calcium score interpretation by the cardiologist is attached. COMPARISON: None. FINDINGS: Vascular: No aortic atherosclerosis. The included aorta is normal in caliber. Mediastinum/nodes: No adenopathy or mass. Lungs: No mass or consolidation. No focal airspace disease. No suspicious or worrisome pulmonary nodule. No pleural fluid. The included airways are patent. Upper abdomen: Normal Musculoskeletal: No acute or suspicious osseous abnormalities. IMPRESSION: 1.  No acute process is identified in the imaged portions of the chest. Electronically signed by: Edrie Gower MD 11/13/2023 07:19  PM EDT RP Workstation: [id number]    Labs: CBC    Component Value Date/Time   WBC 3.5 (L) 01/16/2022 1025   RBC 4.56 01/16/2022 1025   HGB 13.6 01/16/2022 1025   HCT 41.5 01/16/2022 1025   PLT 217.0 01/16/2022 1025   MCV 90.9 01/16/2022 1025   MCH 29.1 05/25/2015 1900   MCHC 32.7 01/16/2022 1025   RDW 15.4 01/16/2022 1025   LYMPHSABS 0.4 (L) 01/16/2022 1025   MONOABS 0.4 01/16/2022 1025   EOSABS 0.1 01/16/2022 1025   BASOSABS 0.0 01/16/2022 1025    CMP     Component Value Date/Time   NA 137 01/16/2022 1025   K 4.0 01/16/2022 1025   CL 109 01/16/2022 1025   CO2 23 01/16/2022 1025   GLUCOSE 89 01/16/2022 1025   BUN 13 01/16/2022 1025   CREATININE 0.81 01/16/2022 1025   CALCIUM 8.8 01/16/2022 1025   PROT 6.5 01/16/2022 1025   ALBUMIN 4.3 01/16/2022 1025   AST 21 01/16/2022 1025   ALT 29 01/16/2022 1025   ALKPHOS 64 01/16/2022 1025   BILITOT 0.3 01/16/2022 1025   GFRNONAA >60 05/25/2015 1900   GFRAA >60 05/25/2015 1900       Assessment and Plan:   PAYTEN HOBIN is a 63 y.o. y/o female returns for follow-up of:  1.  Chronic worsening nausea; uncertain etiology.  Possibly due to hydrocodone , however patient reports being on this medicine for 10 years. - Labs CBC, CMP, lipase - Scheduling EGD: 12/10/23 with DR. Pyrtle I discussed risks of EGD with patient to include risk of bleeding, perforation, and risk of sedation.  Patient expressed understanding and agrees to proceed with EGD.  - Limit use of hydrocodone   2.  Generalized abdominal pain with weight loss - Scheduling abdominal pelvic CT  3.  GERD - Continue pantoprazole  40 Mg daily  4.  Irritable bowel syndrome with constipation  - Continue Linzess  145 mcg daily  5.  History of adenomatous colon polyps - 5-year repeat colonoscopy will be due 02/2027.  Brigitte Canard, PA-C  Follow up 4 weeks after EGD.

## 2023-11-27 NOTE — Telephone Encounter (Signed)
 Are there any APP appointments available tomorrow or next week?  If so I would recommend an office visit

## 2023-11-27 NOTE — Telephone Encounter (Signed)
 Noted.   I spoke to pt and assisted in sched a f/u appt for 5/23 @ 2:30 PM with Brigitte Canard, PA-C. Pt agreeable and aware of appt date and time. Encouraged pt to call back with further questions, concerns, new or worsening symptoms. Pt verbalized understanding.

## 2023-11-28 ENCOUNTER — Encounter: Payer: Self-pay | Admitting: Physician Assistant

## 2023-11-28 ENCOUNTER — Other Ambulatory Visit (INDEPENDENT_AMBULATORY_CARE_PROVIDER_SITE_OTHER)

## 2023-11-28 ENCOUNTER — Ambulatory Visit (INDEPENDENT_AMBULATORY_CARE_PROVIDER_SITE_OTHER): Admitting: Physician Assistant

## 2023-11-28 DIAGNOSIS — K219 Gastro-esophageal reflux disease without esophagitis: Secondary | ICD-10-CM

## 2023-11-28 DIAGNOSIS — R634 Abnormal weight loss: Secondary | ICD-10-CM

## 2023-11-28 DIAGNOSIS — R112 Nausea with vomiting, unspecified: Secondary | ICD-10-CM

## 2023-11-28 DIAGNOSIS — R110 Nausea: Secondary | ICD-10-CM

## 2023-11-28 DIAGNOSIS — K581 Irritable bowel syndrome with constipation: Secondary | ICD-10-CM

## 2023-11-28 DIAGNOSIS — Z860101 Personal history of adenomatous and serrated colon polyps: Secondary | ICD-10-CM

## 2023-11-28 DIAGNOSIS — Z79891 Long term (current) use of opiate analgesic: Secondary | ICD-10-CM

## 2023-11-28 DIAGNOSIS — R1084 Generalized abdominal pain: Secondary | ICD-10-CM

## 2023-11-28 LAB — CBC WITH DIFFERENTIAL/PLATELET
Basophils Absolute: 0 10*3/uL (ref 0.0–0.1)
Basophils Relative: 0.5 % (ref 0.0–3.0)
Eosinophils Absolute: 0 10*3/uL (ref 0.0–0.7)
Eosinophils Relative: 1.3 % (ref 0.0–5.0)
HCT: 44.1 % (ref 36.0–46.0)
Hemoglobin: 14.7 g/dL (ref 12.0–15.0)
Lymphocytes Relative: 11.1 % — ABNORMAL LOW (ref 12.0–46.0)
Lymphs Abs: 0.4 10*3/uL — ABNORMAL LOW (ref 0.7–4.0)
MCHC: 33.4 g/dL (ref 30.0–36.0)
MCV: 89 fl (ref 78.0–100.0)
Monocytes Absolute: 0.5 10*3/uL (ref 0.1–1.0)
Monocytes Relative: 13.4 % — ABNORMAL HIGH (ref 3.0–12.0)
Neutro Abs: 2.5 10*3/uL (ref 1.4–7.7)
Neutrophils Relative %: 73.7 % (ref 43.0–77.0)
Platelets: 255 10*3/uL (ref 150.0–400.0)
RBC: 4.96 Mil/uL (ref 3.87–5.11)
RDW: 14.9 % (ref 11.5–15.5)
WBC: 3.4 10*3/uL — ABNORMAL LOW (ref 4.0–10.5)

## 2023-11-28 LAB — COMPREHENSIVE METABOLIC PANEL WITH GFR
ALT: 27 U/L (ref 0–35)
AST: 19 U/L (ref 0–37)
Albumin: 4.7 g/dL (ref 3.5–5.2)
Alkaline Phosphatase: 54 U/L (ref 39–117)
BUN: 10 mg/dL (ref 6–23)
CO2: 30 meq/L (ref 19–32)
Calcium: 9.5 mg/dL (ref 8.4–10.5)
Chloride: 104 meq/L (ref 96–112)
Creatinine, Ser: 0.73 mg/dL (ref 0.40–1.20)
GFR: 87.63 mL/min (ref >60.00)
Glucose, Bld: 82 mg/dL (ref 70–99)
Potassium: 4.3 meq/L (ref 3.5–5.1)
Sodium: 141 meq/L (ref 135–145)
Total Bilirubin: 0.4 mg/dL (ref 0.2–1.2)
Total Protein: 7 g/dL (ref 6.0–8.3)

## 2023-11-28 LAB — LIPASE: Lipase: 26 U/L (ref 11.0–59.0)

## 2023-11-28 NOTE — Patient Instructions (Signed)
 _______________________________________________________  If your blood pressure at your visit was 140/90 or greater, please contact your primary care physician to follow up on this.  If you are age 63 or younger, your body mass index should be between 19-25. Your Body mass index is 19.43 kg/m. If this is out of the aformentioned range listed, please consider follow up with your Primary Care Provider.  ________________________________________________________  The [facility] providers would like to encourage you to use MYCHART to communicate with providers for non-urgent requests or questions.  Due to long hold times on the telephone, sending your provider a message by [facility] may be a faster and more efficient way to get a response.  Please allow 48 business hours for a response.  Please remember that this is for non-urgent requests.  _______________________________________________________  Your provider has requested that you go to the basement level for lab work before leaving today. Press "B" on the elevator. The lab is located at the first door on the left as you exit the elevator.  You have been scheduled for a CT scan of the abdomen and pelvis at [facility] in [place], ([address] You are scheduled on 12-02-23 at 12pm. You should arrive at 9:45am for registration and to drink contrast.  Please follow the written instructions below on the day of your exam:   1) Do not eat anything after 8am (4 hours prior to your test)   You may take any medications as prescribed with a small amount of water, if necessary. If you take any of the following medications: METFORMIN, GLUCOPHAGE, GLUCOVANCE, AVANDAMET, RIOMET, FORTAMET, ACTOPLUS MET, JANUMET, GLUMETZA or METAGLIP, you MAY be asked to HOLD this medication 48 hours AFTER the exam.   The purpose of you drinking the oral contrast is to aid in the visualization of your intestinal tract. The contrast solution may cause some diarrhea. Depending on your  individual set of symptoms, you may also receive an intravenous injection of x-ray contrast/dye. Plan on being at [facility] for 45 minutes or longer, depending on the type of exam you are having performed.   If you have any questions regarding your exam or if you need to reschedule, you may call [facility] at [phone] between the hours of 8:00 am and 5:00 pm, Monday-Friday.   You have been scheduled for an endoscopy. Please follow written instructions given to you at your visit today.  If you use inhalers (even only as needed), please bring them with you on the day of your procedure.  If you take any of the following medications, they will need to be adjusted prior to your procedure:   DO NOT TAKE 7 DAYS PRIOR TO TEST- Trulicity (dulaglutide) Ozempic, Wegovy (semaglutide) Mounjaro (tirzepatide) Bydureon Bcise (exanatide extended release)  DO NOT TAKE 1 DAY PRIOR TO YOUR TEST Rybelsus (semaglutide) Adlyxin (lixisenatide) Victoza (liraglutide) Byetta (exanatide) ___________________________________________________________________________  Due to recent changes in healthcare laws, you may see the results of your imaging and laboratory studies on MyChart before your provider has had a chance to review them.  We understand that in some cases there may be results that are confusing or concerning to you. Not all laboratory results come back in the same time frame and the provider may be waiting for multiple results in order to interpret others.  Please give us  48 hours in order for your provider to thoroughly review all the results before contacting the office for clarification of your results.   Thank you for entrusting me with  your care and choosing [facility].  Brigitte Canard, PA-C

## 2023-11-29 ENCOUNTER — Encounter: Payer: Self-pay | Admitting: Internal Medicine

## 2023-12-02 ENCOUNTER — Inpatient Hospital Stay (HOSPITAL_BASED_OUTPATIENT_CLINIC_OR_DEPARTMENT_OTHER): Admission: RE | Admit: 2023-12-02 | Source: Ambulatory Visit | Admitting: Radiology

## 2023-12-02 ENCOUNTER — Ambulatory Visit: Payer: Self-pay | Admitting: Physician Assistant

## 2023-12-02 NOTE — Progress Notes (Signed)
 Addendum: Reviewed and agree with assessment and management plan. Asha Grumbine, Carie Caddy, MD

## 2023-12-04 ENCOUNTER — Ambulatory Visit (INDEPENDENT_AMBULATORY_CARE_PROVIDER_SITE_OTHER)
Admission: RE | Admit: 2023-12-04 | Discharge: 2023-12-04 | Disposition: A | Discharge status: Home / Self Care | Source: Ambulatory Visit | Attending: Physician Assistant | Admitting: Physician Assistant

## 2023-12-04 DIAGNOSIS — R1084 Generalized abdominal pain: Secondary | ICD-10-CM

## 2023-12-04 DIAGNOSIS — R112 Nausea with vomiting, unspecified: Secondary | ICD-10-CM | POA: Diagnosis not present

## 2023-12-04 DIAGNOSIS — R634 Abnormal weight loss: Secondary | ICD-10-CM

## 2023-12-04 MED ORDER — IOHEXOL 300 MG/ML  SOLN
100.0000 mL | Freq: Once | INTRAMUSCULAR | Status: AC | PRN
  Administered 2023-12-04: 70 mL via INTRAVENOUS

## 2023-12-09 ENCOUNTER — Telehealth: Payer: Self-pay | Admitting: Cardiology

## 2023-12-09 NOTE — Telephone Encounter (Signed)
 Caller Concha Deed) stated they want to start this patient on Ritalin  and wants a call back to confirm this would be OK.

## 2023-12-10 ENCOUNTER — Encounter: Payer: Self-pay | Admitting: Internal Medicine

## 2023-12-10 ENCOUNTER — Ambulatory Visit: Admitting: Internal Medicine

## 2023-12-10 VITALS — BP 110/58 | HR 72 | Temp 98.2°F | Resp 11 | Ht 60.0 in | Wt 100.0 lb

## 2023-12-10 DIAGNOSIS — R112 Nausea with vomiting, unspecified: Secondary | ICD-10-CM

## 2023-12-10 DIAGNOSIS — K2970 Gastritis, unspecified, without bleeding: Secondary | ICD-10-CM

## 2023-12-10 MED ORDER — SODIUM CHLORIDE 0.9 % IV SOLN: 500.0000 mL | INTRAVENOUS | Status: DC

## 2023-12-10 MED ORDER — PANTOPRAZOLE SODIUM 40 MG PO TBEC: 40.0000 mg | DELAYED_RELEASE_TABLET | Freq: Every day | ORAL | 3 refills | Status: DC

## 2023-12-10 NOTE — Progress Notes (Signed)
 See office note dated 11/28/2023 for details and current H&P  Patient presenting for upper endoscopy to evaluate chronic and worsening nausea, generalized abdominal pain and history of GERD  She is appropriate for EGD here today

## 2023-12-10 NOTE — Progress Notes (Signed)
 Pt's states no medical or surgical changes since previsit or office visit.

## 2023-12-10 NOTE — Progress Notes (Signed)
 Vss nad trans to pacu

## 2023-12-10 NOTE — Progress Notes (Signed)
 Called to room to assist during endoscopic procedure.  Patient ID and intended procedure confirmed with present staff. Received instructions for my participation in the procedure from the performing physician.

## 2023-12-10 NOTE — Op Note (Signed)
 [facility] Patient Name: Sierra Morris Procedure Date: 12/10/2023 4:10 PM MRN: [id number] Endoscopist: Nannette Babe , MD, [id number] Age: 63 Referring MD:  Date of Birth: June 29, 1961 Gender: Female Account #: [id number] Procedure:                Upper GI endoscopy Indications:              Nausea with occasional vomiting Medicines:                Monitored Anesthesia Care Procedure:                Pre-Anesthesia Assessment:                           - Prior to the procedure, a History and Physical                            was performed, and patient medications and                            allergies were reviewed. The patient's tolerance of                            previous anesthesia was also reviewed. The risks                            and benefits of the procedure and the sedation                            options and risks were discussed with the patient.                            All questions were answered, and informed consent                            was obtained. Prior Anticoagulants: The patient has                            taken no anticoagulant or antiplatelet agents. ASA                            Grade Assessment: III - A patient with severe                            systemic disease. After reviewing the risks and                            benefits, the patient was deemed in satisfactory                            condition to undergo the procedure.                           After obtaining informed consent, the endoscope was  passed under direct vision. Throughout the                            procedure, the patient's blood pressure, pulse, and                            oxygen saturations were monitored continuously. The                            GIF [id number] #[id number] was introduced through the                            mouth, and advanced to the second part of duodenum.                            The upper GI endoscopy  was accomplished without                            difficulty. The patient tolerated the procedure                            well. Scope In: Scope Out: Findings:                 The examined esophagus was normal.                           Diffuse moderate inflammation characterized by                            congestion (edema), erythema and granularity was                            found in the gastric body and in the gastric                            antrum. Biopsies were taken with a cold forceps for                            Helicobacter pylori testing.                           The examined duodenum was normal. Biopsies were                            taken with a cold forceps for histology. Complications:            No immediate complications. Estimated Blood Loss:     Estimated blood loss was minimal. Impression:               - Normal esophagus.                           - Gastritis. Biopsied.                           - Normal  examined duodenum. Biopsied. Recommendation:           - Patient has a contact number available for                            emergencies. The signs and symptoms of potential                            delayed complications were discussed with the                            patient. Return to normal activities tomorrow.                            Written discharge instructions were provided to the                            patient.                           - Resume previous diet.                           - Continue present medications.                           - Begin pantoprazole  40 mg daily for gastritis and                            nausea while we wait on the biopsy results.                           - Await pathology results. Nannette Babe, MD 12/10/2023 4:37:14 PM This report has been signed electronically.

## 2023-12-10 NOTE — Telephone Encounter (Signed)
 Recommendations reviewed with Concha Deed as per Dr. Tonja Fray note.  Concha Deed verbalized understanding and had no additional questions.

## 2023-12-10 NOTE — Patient Instructions (Signed)
 Resume previous diet and medications.  PANTOPRAZOLE  40MG  DAILY ORDERED - Please take daily until biopsy results come back.  Results will be sent via MyChart or by letter.    YOU HAD AN ENDOSCOPIC PROCEDURE TODAY AT THE [facility]:   Refer to the procedure report that was given to you for any specific questions about what was found during the examination.  If the procedure report does not answer your questions, please call your gastroenterologist to clarify.  If you requested that your care partner not be given the details of your procedure findings, then the procedure report has been included in a sealed envelope for you to review at your convenience later.  YOU SHOULD EXPECT: Some feelings of bloating in the abdomen. Passage of more gas than usual.  Walking can help get rid of the air that was put into your GI tract during the procedure and reduce the bloating. If you had a lower endoscopy (such as a colonoscopy or flexible sigmoidoscopy) you may notice spotting of blood in your stool or on the toilet paper. If you underwent a bowel prep for your procedure, you may not have a normal bowel movement for a few days.  Please Note:  You might notice some irritation and congestion in your nose or some drainage.  This is from the oxygen used during your procedure.  There is no need for concern and it should clear up in a day or so.  SYMPTOMS TO REPORT IMMEDIATELY:  Following upper endoscopy (EGD)  Vomiting of blood or coffee ground material  New chest pain or pain under the shoulder blades  Painful or persistently difficult swallowing  New shortness of breath  Fever of 100F or higher  Black, tarry-looking stools  For urgent or emergent issues, a gastroenterologist can be reached at any hour by calling [phone]. Do not use MyChart messaging for urgent concerns.    DIET:  We do recommend a small meal at first, but then you may proceed to your regular diet.  Drink plenty of fluids  but you should avoid alcoholic beverages for 24 hours.  ACTIVITY:  You should plan to take it easy for the rest of today and you should NOT DRIVE or use heavy machinery until tomorrow (because of the sedation medicines used during the test).    FOLLOW UP: Our staff will call the number listed on your records the next business day following your procedure.  We will call around 7:15- 8:00 am to check on you and address any questions or concerns that you may have regarding the information given to you following your procedure. If we do not reach you, we will leave a message.     If any biopsies were taken you will be contacted by phone or by letter within the next 1-3 weeks.  Please call us  at [phone] if you have not heard about the biopsies in 3 weeks.    SIGNATURES/CONFIDENTIALITY: You and/or your care partner have signed paperwork which will be entered into your electronic medical record.  These signatures attest to the fact that that the information above on your After Visit Summary has been reviewed and is understood.  Full responsibility of the confidentiality of this discharge information lies with you and/or your care-partner.

## 2023-12-11 ENCOUNTER — Telehealth: Payer: Self-pay

## 2023-12-11 NOTE — Telephone Encounter (Signed)
 Left message

## 2023-12-12 ENCOUNTER — Telehealth: Payer: Self-pay | Admitting: Internal Medicine

## 2023-12-12 MED ORDER — PANTOPRAZOLE SODIUM 40 MG PO TBEC: 40.0000 mg | DELAYED_RELEASE_TABLET | Freq: Every day | ORAL | 3 refills | Status: DC

## 2023-12-12 NOTE — Telephone Encounter (Signed)
 Inbound call from patient, states [facility] has not received the prescription for the pantoprazole , patient would like it resent.

## 2023-12-12 NOTE — Telephone Encounter (Signed)
 Verified that the prescription for pantoprazole  goes to [facility] in [place], [place]. Patient verbalized understanding. Informed patient it was sent to the correct pharmacy but I will send the medication again.

## 2023-12-15 LAB — SURGICAL PATHOLOGY

## 2023-12-16 ENCOUNTER — Ambulatory Visit: Payer: Self-pay | Admitting: Internal Medicine

## 2023-12-16 ENCOUNTER — Encounter: Payer: Self-pay | Admitting: Physician Assistant

## 2023-12-22 ENCOUNTER — Encounter (HOSPITAL_BASED_OUTPATIENT_CLINIC_OR_DEPARTMENT_OTHER): Payer: Self-pay | Admitting: Nurse Practitioner

## 2023-12-22 ENCOUNTER — Other Ambulatory Visit (HOSPITAL_BASED_OUTPATIENT_CLINIC_OR_DEPARTMENT_OTHER): Payer: Self-pay | Admitting: Nurse Practitioner

## 2023-12-22 DIAGNOSIS — G35 Multiple sclerosis: Secondary | ICD-10-CM

## 2023-12-22 DIAGNOSIS — R569 Unspecified convulsions: Secondary | ICD-10-CM

## 2023-12-22 DIAGNOSIS — R251 Tremor, unspecified: Secondary | ICD-10-CM

## 2023-12-22 DIAGNOSIS — G500 Trigeminal neuralgia: Secondary | ICD-10-CM

## 2023-12-25 ENCOUNTER — Ambulatory Visit (INDEPENDENT_AMBULATORY_CARE_PROVIDER_SITE_OTHER)
Admission: RE | Admit: 2023-12-25 | Discharge: 2023-12-25 | Disposition: A | Discharge status: Home / Self Care | Source: Ambulatory Visit | Attending: Nurse Practitioner | Admitting: Nurse Practitioner

## 2023-12-25 DIAGNOSIS — G35 Multiple sclerosis: Secondary | ICD-10-CM

## 2023-12-25 DIAGNOSIS — G500 Trigeminal neuralgia: Secondary | ICD-10-CM

## 2023-12-25 DIAGNOSIS — R251 Tremor, unspecified: Secondary | ICD-10-CM | POA: Diagnosis not present

## 2023-12-25 DIAGNOSIS — R569 Unspecified convulsions: Secondary | ICD-10-CM | POA: Diagnosis not present

## 2023-12-25 MED ORDER — GADOBUTROL 1 MMOL/ML IV SOLN
4.5000 mL | Freq: Once | INTRAVENOUS | Status: AC | PRN
  Administered 2023-12-25: 4.5 mL via INTRAVENOUS

## 2024-01-02 ENCOUNTER — Ambulatory Visit: Admitting: Physician Assistant

## 2024-01-05 ENCOUNTER — Ambulatory Visit: Attending: Cardiology | Admitting: Cardiology

## 2024-01-05 VITALS — BP 124/72 | HR 86 | Ht 60.0 in | Wt 97.8 lb

## 2024-01-05 DIAGNOSIS — R55 Syncope and collapse: Secondary | ICD-10-CM | POA: Diagnosis not present

## 2024-01-05 DIAGNOSIS — G35 Multiple sclerosis: Secondary | ICD-10-CM | POA: Insufficient documentation

## 2024-01-05 DIAGNOSIS — E785 Hyperlipidemia, unspecified: Secondary | ICD-10-CM | POA: Diagnosis not present

## 2024-01-05 DIAGNOSIS — F17200 Nicotine dependence, unspecified, uncomplicated: Secondary | ICD-10-CM | POA: Insufficient documentation

## 2024-01-05 NOTE — Patient Instructions (Signed)

## 2024-01-05 NOTE — Progress Notes (Signed)
 Cardiology Office Note:    Date:  01/05/2024   ID:  Sierra Morris, DOB 1960-07-16, MRN [id number]  PCP:  Clemmie Nest, MD  Cardiologist:  Lamar Fitch, MD    Referring MD: Clemmie Nest, MD   Chief Complaint  Patient presents with   Results    History of Present Illness:    Sierra Morris is a 63 y.o. female was referred to us  because of her episodes of dizziness and passing out.  Past medical history also include dyslipidemia, multiple sclerosis.  Comes today to months for follow-up she did her monitor which showed some episode of very short lasting supraventricular tachycardia no symptomatic.  Basically there are no explanation for dizziness syncope identified on the monitor.  She also got calcium score done her calcium score was mildly elevated at 83, calculated 10 years predicted risk is 10.5%.  Comes today to talk about this.  Denies have any cardiac complaints no chest pain tightness squeezing pressure burning chest.  Still unsteady gait and some dizziness she did have dizziness when she wore monitor no arrhythmia identified  Past Medical History:  Diagnosis Date   Anorexia nervosa    patient denies   Anxiety    Arthritis    Cataract    Chronic depression    Chronic headaches    Colitis    Constipation due to opioid therapy    Diverticulosis    Emphysema/COPD [facility])    patient unaware   Fibromyalgia    GERD (gastroesophageal reflux disease)    History of blood in urine    Hyperthyroidism    borderline per patient   MS (multiple sclerosis) (HCC)    Osteoarthritis    Osteoporosis    Pituitary microadenoma (HCC)    PONV (postoperative nausea and vomiting)    Seizure (HCC)    Sleep disorder    Tremor    Tubular adenoma of colon     Past Surgical History:  Procedure Laterality Date   ABDOMINAL HYSTERECTOMY  2008   APPENDECTOMY     CESAREAN SECTION     x 2   CHOLECYSTECTOMY     COLONOSCOPY     DENTAL SURGERY     LAPAROSCOPIC BILATERAL SALPINGO  OOPHERECTOMY Bilateral 11/30/2014   Procedure: LAPAROSCOPIC BILATERAL SALPINGO OOPHORECTOMY;  Surgeon: Charlie Croak, MD;  Location: [facility];  Service: Gynecology;  Laterality: Bilateral;  converted to open   LAPAROSCOPY     LAPAROTOMY N/A 11/30/2014   Procedure: POSSIBLE EXPLORATORY LAPAROTOMY, ;  Surgeon: Charlie Croak, MD;  Location: [facility];  Service: Gynecology;  Laterality: N/A;   NECK SURGERY     OOPHORECTOMY     SALPINGOOPHORECTOMY Bilateral 11/30/2014   Procedure: BILATERAL SALPINGO OOPHORECTOMY;  Surgeon: Charlie Croak, MD;  Location: [facility];  Service: Gynecology;  Laterality: Bilateral;   TONSILLECTOMY     tumor breast bone removal benign      Current Medications: Current Meds  Medication Sig   baclofen (LIORESAL) 10 MG tablet Take 10 mg by mouth 3 (three) times daily.   clonazePAM  (KLONOPIN ) 0.5 MG tablet Take 1 mg by mouth daily.   estrogen-methylTESTOSTERone 0.625-1.25 MG tablet Take 1 tablet by mouth every other day.   FLUoxetine (PROZAC) 20 MG capsule Take 20 mg by mouth daily.   HYDROcodone -acetaminophen  (NORCO/VICODIN) 5-325 MG tablet Take 1 tablet by mouth daily.   hydrOXYzine (ATARAX/VISTARIL) 25 MG tablet Take 25 mg by mouth 3 (three) times daily as needed.   LINZESS  145 MCG CAPS capsule Take 145  mcg by mouth every morning.   methylphenidate  (RITALIN ) 20 MG tablet Take 20 mg by mouth 3 (three) times daily.   ondansetron  (ZOFRAN ) 8 MG tablet Take 8 mg by mouth 3 (three) times daily.   pantoprazole  (PROTONIX ) 40 MG tablet Take 1 tablet (40 mg total) by mouth daily.   ZEPOSIA 0.92 MG CAPS Take 1 capsule by mouth daily.   [DISCONTINUED] ondansetron  (ZOFRAN -ODT) 8 MG disintegrating tablet Take 8 mg by mouth every 8 (eight) hours as needed for nausea.     Allergies:   Aubagio [teriflunomide], Wheat, Carbidopa, Prednisone, Benadryl  [diphenhydramine ], Buprenorphine hcl-naloxone  hcl, Paroxetine, Paroxetine hcl, Paroxetine hcl, Polyethylene glycol, Pregabalin, and Suboxone  [buprenorphine hcl-naloxone  hcl]   Social History   Socioeconomic History   Marital status: Single    Spouse name: Not on file   Number of children: 3   Years of education: Not on file   Highest education level: Not on file  Occupational History   Occupation: disabled   Tobacco Use   Smoking status: Some Days    Current packs/day: 0.25    Average packs/day: 0.3 packs/day for 0.5 years (0.1 ttl pk-yrs)    Types: Cigarettes   Smokeless tobacco: Never  Vaping Use   Vaping status: Some Days  Substance and Sexual Activity   Alcohol use: Yes    Alcohol/week: 0.0 standard drinks of alcohol    Comment: rare   Drug use: No   Sexual activity: Not on file  Other Topics Concern   Not on file  Social History Narrative   Not on file   Social Drivers of Health   Financial Resource Strain: Not on file  Food Insecurity: Not on file  Transportation Needs: Not on file  Physical Activity: Not on file  Stress: Not on file  Social Connections: Not on file     Family History: The patient's family history includes COPD in her father; Colon cancer in her paternal grandmother; Colon polyps in her father and mother; Depression in her father; Diabetes in her father and maternal grandmother; Hypertension in her mother; Irritable bowel syndrome in an other family member; Pancreatitis in her son; Rectal cancer in her paternal grandmother. There is no history of Esophageal cancer, Stomach cancer, or Pancreatic cancer. ROS:   Please see the history of present illness.    All 14 point review of systems negative except as described per history of present illness  EKGs/Labs/Other Studies Reviewed:         Recent Labs: 09/26/2023: TSH 0.46 11/28/2023: ALT 27; BUN 10; Creatinine, Ser 0.73; Hemoglobin 14.7; Platelets 255.0; Potassium 4.3; Sodium 141  Recent Lipid Panel No results found for: CHOL, TRIG, HDL, CHOLHDL, VLDL, LDLCALC, LDLDIRECT  Physical Exam:    VS:  BP 124/72 (BP  Location: Right Arm, Patient Position: Sitting)   Pulse 86   Ht 5' (1.524 m)   Wt 97 lb 12.8 oz (44.4 kg)   SpO2 95%   BMI 19.10 kg/m     Wt Readings from Last 3 Encounters:  01/05/24 97 lb 12.8 oz (44.4 kg)  12/10/23 100 lb (45.4 kg)  11/28/23 99 lb 8 oz (45.1 kg)     GEN:  Well nourished, well developed in no acute distress HEENT: Normal NECK: No JVD; No carotid bruits LYMPHATICS: No lymphadenopathy CARDIAC: RRR, no murmurs, no rubs, no gallops RESPIRATORY:  Clear to auscultation without rales, wheezing or rhonchi  ABDOMEN: Soft, non-tender, non-distended MUSCULOSKELETAL:  No edema; No deformity  SKIN: Warm and dry LOWER  EXTREMITIES: no swelling NEUROLOGIC:  Alert and oriented x 3 PSYCHIATRIC:  Normal affect   ASSESSMENT:    1. Multiple sclerosis (HCC)   2. Syncope and collapse   3. Smoker   4. Dyslipidemia    PLAN:    In order of problems listed above:  Dizziness, no cardiac explanation for it.  She did wear a monitor no arrhythmia when she had dizzy spells.  Continue to stay well-hydrated and is be simply careful precaution. Dyslipidemia calculated 10 years predicted risk is more than 10%.  I recommended statin she is reluctant she would like to try natural way with diet and exercise.  Will recheck fasting lipid profile 6 months from now if we decide to pursue medical therapy we will try something very gentle like pravastatin. Smoking still a problem she understand she is trying to quit.   Medication Adjustments/Labs and Tests Ordered: Current medicines are reviewed at length with the patient today.  Concerns regarding medicines are outlined above.  No orders of the defined types were placed in this encounter.  Medication changes: No orders of the defined types were placed in this encounter.   Signed, Lamar DOROTHA Fitch, MD, [facility] 01/05/2024 1:28 PM    [facility]

## 2024-01-10 NOTE — Progress Notes (Unsigned)
 Ellouise Console, PA-C [address] [place], [place]  [address] Phone: [phone]   Primary Care Physician: Clemmie Nest, MD  Primary Gastroenterologist:  Ellouise Console, PA-C / Dr. Gordy Starch   Chief Complaint: Follow-up chronic nausea, and weight loss       HPI:   Sierra Morris is a 63 y.o. female returns for follow-up of chronic nausea.  She has history of adenomatous colon polyps, diverticulosis with history of prior diverticulitis, chronic constipation, MS, GERD, history of fibromyalgia and cervical disc disease.  Possible Parkinson's per neurology.   She last saw Dr. Starch 07/2023 to evaluate abdominal discomfort and severe nausea for 1 year.  She was started on pantoprazole  40 Mg once daily for acid reflux.  Continue Zofran  as needed for nausea.  This treatment did not help.  She continued to have weight loss, nausea, and vomiting.  She has been on hydrocodone  10 mg daily for 15 years.  She states pantoprazole  made her nausea worse and she discontinued.  She has had cholecystectomy, appendectomy, and hysterectomy.   She also has had generalized abdominal pain with diarrhea alternating with constipation.  Chronic constipation Treated with Linzess  145 mcg daily with benefit.  Having bowel movement daily or every other day.  She takes hydrocodone  for chronic pain (likely exacerbating her constipation and nausea).  On Zeposia for MS.    11/28/2023 labs: Normal CBC, CMP, and lipase.  Slightly low WBC 3.4.  Normal Hgb 14.7.  Normal LFTs.  12/04/2023 CT abdomen pelvis with contrast: No acute abnormality.  No bowel inflammation or obstruction.  12/10/23 EGD by Dr. Starch: Moderate gastritis.  Normal esophagus and duodenum.  Biopsies negative for H. pylori and celiac.  No intestinal metaplasia or dysplasia.   02/2022 last colonoscopy: 3 small sessile serrated and tubular adenoma polyps removed.  2 small hyperplastic polyps removed.  Excellent prep.  Mild diverticulosis.  Biopsies  negative for microscopic colitis and IBD.  5-year repeat colonoscopy (due 02/2027).   Current symptoms: Patient states she continues to have bad nausea which is worse in the morning.  She eats 2 meals per day, later in the day.  She is drinking boost.  Continues to have mild weight loss.  She reports a sore throat and sore neck glands.  Recent thyroid  ultrasound normal.  Recent head MRI by her neurologist showed no change since 2020, no significant pathology.  Weights:  08/06/2017 05/12/2018 03/03/2019 01/16/2022 02/11/2022  Weight /BMI       Weight 106 lb  104 lb 6.4 oz  104 lb  105 lb 12.8 oz  105 lb   Height 5' (1.524 m)  5' (1.524 m)  5' (1.524 m)  5' (1.524 m)  5' (1.524 m)   BMI 20.7 kg/m2  20.39 kg/m2  20.31 kg/m2  20.66 kg/m2  20.51 kg/m2     05/01/2022 05/28/2023 07/15/2023 10/22/2023 11/26/2023  Weight /BMI       Weight 109 lb  101 lb  100 lb  101 lb 6.4 oz  100 lb   Height 5' (1.524 m)  5' (1.524 m)  5' (1.524 m)  5' (1.524 m)  5' (1.524 m)   BMI 21.29 kg/m2  19.73 kg/m2  19.53 kg/m2  19.8 kg/m2  19.53 kg/m2     11/28/2023 12/10/2023 01/05/2024 01/12/2024  Weight /BMI      Weight 99 lb 8 oz  100 lb  97 lb 12.8 oz  95 lb 12.8 oz   Height  5' (1.524 m)  5' (1.524 m)  5' (1.524 m)  5' 1 (1.549 m)   BMI 19.43 kg/m2  19.53 kg/m2  19.1 kg/m2  18.1 kg/m2      Current Outpatient Medications  Medication Sig Dispense Refill   baclofen (LIORESAL) 10 MG tablet Take 10 mg by mouth 3 (three) times daily.     clonazePAM  (KLONOPIN ) 0.5 MG tablet Take 1 mg by mouth daily.     estrogen-methylTESTOSTERone 0.625-1.25 MG tablet Take 1 tablet by mouth every other day.     FLUoxetine (PROZAC) 20 MG capsule Take 20 mg by mouth daily.     HYDROcodone -acetaminophen  (NORCO/VICODIN) 5-325 MG tablet Take 1 tablet by mouth daily.     hydrOXYzine (ATARAX/VISTARIL) 25 MG tablet Take 25 mg by mouth 3 (three) times daily as needed.     LINZESS  145 MCG CAPS capsule Take 145 mcg by mouth every morning.     methylphenidate   (RITALIN ) 20 MG tablet Take 20 mg by mouth 3 (three) times daily.  0   ondansetron  (ZOFRAN ) 8 MG tablet Take 8 mg by mouth 3 (three) times daily.     pantoprazole  (PROTONIX ) 40 MG tablet Take 1 tablet (40 mg total) by mouth daily. 90 tablet 3   sucralfate  (CARAFATE ) 1 g tablet Take 1 tablet (1 g total) by mouth 3 (three) times daily before meals. 90 tablet 2   ZEPOSIA 0.92 MG CAPS Take 1 capsule by mouth daily.     No current facility-administered medications for this visit.    Allergies as of 01/12/2024 - Review Complete 01/12/2024  Allergen Reaction Noted   Aubagio [teriflunomide] Other (See Comments) 10/14/2016   Wheat Shortness Of Breath 11/28/2014   Carbidopa Other (See Comments) 10/22/2023   Prednisone Other (See Comments) 02/26/2017   Benadryl  [diphenhydramine ] Itching 01/01/2016   Buprenorphine hcl-naloxone  hcl Other (See Comments) 02/11/2022   Paroxetine Other (See Comments) 11/12/2023   Paroxetine hcl Other (See Comments) 06/24/2011   Paroxetine hcl Other (See Comments) 02/11/2022   Polyethylene glycol Rash and Itching 01/01/2016   Pregabalin Itching 06/24/2011   Suboxone [buprenorphine hcl-naloxone  hcl] Other (See Comments) 07/27/2014    Past Medical History:  Diagnosis Date   Anorexia nervosa    patient denies   Anxiety    Arthritis    Cataract    Chronic depression    Chronic headaches    Colitis    Constipation due to opioid therapy    Diverticulosis    Emphysema/COPD (HCC)    patient unaware   Fibromyalgia    GERD (gastroesophageal reflux disease)    History of blood in urine    Hyperthyroidism    borderline per patient   MS (multiple sclerosis) (HCC)    Osteoarthritis    Osteoporosis    Pituitary microadenoma (HCC)    PONV (postoperative nausea and vomiting)    Seizure (HCC)    Sleep disorder    Tremor    Tubular adenoma of colon     Past Surgical History:  Procedure Laterality Date   ABDOMINAL HYSTERECTOMY  2008   APPENDECTOMY     CESAREAN  SECTION     x 2   CHOLECYSTECTOMY     COLONOSCOPY     DENTAL SURGERY     LAPAROSCOPIC BILATERAL SALPINGO OOPHERECTOMY Bilateral 11/30/2014   Procedure: LAPAROSCOPIC BILATERAL SALPINGO OOPHORECTOMY;  Surgeon: Charlie Croak, MD;  Location: [facility];  Service: Gynecology;  Laterality: Bilateral;  converted to open   LAPAROSCOPY     LAPAROTOMY N/A  11/30/2014   Procedure: POSSIBLE EXPLORATORY LAPAROTOMY, ;  Surgeon: Charlie Croak, MD;  Location: [facility];  Service: Gynecology;  Laterality: N/A;   NECK SURGERY     OOPHORECTOMY     SALPINGOOPHORECTOMY Bilateral 11/30/2014   Procedure: BILATERAL SALPINGO OOPHORECTOMY;  Surgeon: Charlie Croak, MD;  Location: [facility];  Service: Gynecology;  Laterality: Bilateral;   TONSILLECTOMY     tumor breast bone removal benign      Review of Systems:    All systems reviewed and negative except where noted in HPI.    Physical Exam:  BP 102/74   Ht 5' 1 (1.549 m)   Wt 95 lb 12.8 oz (43.5 kg)   BMI 18.10 kg/m  No LMP recorded. Patient has had a hysterectomy.  General: Well-nourished, well-developed in no acute distress.  Lungs: Clear to auscultation bilaterally. Non-labored. Heart: Regular rate and rhythm, no murmurs rubs or gallops.  Abdomen: Bowel sounds are normal; Abdomen is Soft; No hepatosplenomegaly, masses or hernias;  No Abdominal Tenderness; No guarding or rebound tenderness. Neuro: Alert and oriented x 3.  Grossly intact.  Psych: Alert and cooperative, normal mood and affect.   Imaging Studies: MR BRAIN/IAC W WO CONTRAST Result Date: 01/06/2024 CLINICAL DATA:  Rule or Ng in the ears. History of multiple sclerosis. EXAM: MRI HEAD WITHOUT AND WITH CONTRAST TECHNIQUE: Multiplanar, multiecho pulse sequences of the brain and surrounding structures were obtained without and with intravenous contrast. CONTRAST:  4.5mL GADAVIST  GADOBUTROL  1 MMOL/ML IV SOLN COMPARISON:  02/08/2019.  09/27/2023. FINDINGS: Brain: Diffusion imaging does not show any  acute or subacute infarction or other cause of restricted diffusion. No focal abnormality affects the brainstem or cerebellum. CP angle regions are normal. No vestibular schwannoma or enhancing neuritis. Cerebral hemispheres show stable pattern of abnormal T2 and FLAIR signal within the white matter, most prominent in the forceps major regions, left more than right. This could be due to small vessel disease or demyelinating disease, but is grossly nonprogressive. No cortical or large vessel territory stroke. No mass, hemorrhage, hydrocephalus or extra-axial collection. No abnormal contrast enhancement occurs. Vascular: Major vessels at the base of the brain show flow. No abnormal venous anatomy in the region of either temporal bone. Skull and upper cervical spine: Negative Sinuses/Orbits: Clear/normal Other: No fluid in the middle ears or mastoid air cells. IMPRESSION: 1. No change since the study of 02/08/2019. No cause of the presenting symptoms is identified. 2. Stable pattern of abnormal T2 and FLAIR signal within the cerebral hemispheric white matter, most prominent in the forceps major regions, left more than right. This could be due to small vessel disease or demyelinating disease, but is grossly nonprogressive. 3. No vestibular schwannoma or other CP angle lesion. Temporal bone vascular anatomy appears unremarkable. Electronically Signed   By: Oneil Officer M.D.   On: 01/06/2024 16:45    Labs: CBC    Component Value Date/Time   WBC 3.4 (L) 11/28/2023 1530   RBC 4.96 11/28/2023 1530   HGB 14.7 11/28/2023 1530   HCT 44.1 11/28/2023 1530   PLT 255.0 11/28/2023 1530   MCV 89.0 11/28/2023 1530   MCH 29.1 05/25/2015 1900   MCHC 33.4 11/28/2023 1530   RDW 14.9 11/28/2023 1530   LYMPHSABS 0.4 (L) 11/28/2023 1530   MONOABS 0.5 11/28/2023 1530   EOSABS 0.0 11/28/2023 1530   BASOSABS 0.0 11/28/2023 1530    CMP     Component Value Date/Time   NA 141 11/28/2023 1530   K 4.3 11/28/2023  1530   CL  104 11/28/2023 1530   CO2 30 11/28/2023 1530   GLUCOSE 82 11/28/2023 1530   BUN 10 11/28/2023 1530   CREATININE 0.73 11/28/2023 1530   CALCIUM 9.5 11/28/2023 1530   PROT 7.0 11/28/2023 1530   ALBUMIN 4.7 11/28/2023 1530   AST 19 11/28/2023 1530   ALT 27 11/28/2023 1530   ALKPHOS 54 11/28/2023 1530   BILITOT 0.4 11/28/2023 1530   GFRNONAA 97 09/27/2023 0927   GFRNONAA 101 09/27/2023 0927   GFRAA >60 05/25/2015 1900    Assessment and Plan:   MALEAHA HUGHETT is a 63 y.o. y/o female returns for follow-up of chronic nausea.  Recent EGD showed chronic gastritis.  Biopsies negative for H. pylori and celiac.  Recent abdominal pelvic CT showed no acute abnormality to explain weight loss.  She has had negative head MRI.  Negative thyroid  ultrasound.  Labs unrevealing.  I am suspicious for gastroparesis and adverse side effects of chronic opioid medication.  Chronic Nausea - Ordering gastric emptying test to evaluate for gastroparesis. - Advised patient to limit opioid medication as much as possible. - Pending GES results, consider Reglan  or metoclopramide .  2.   Chronic Gastritis (H. Pylori Negative) - Rx Carafate  1g 3 times daily before meals, #90, 2 refills. - Encourage patient to eat small frequent meal every 2 hours.  3.  Opiod Induced Constipation - Continue Linzess  145mcg daily.  4.  Weight loss: Uncertain etiology. - Discussed increasing calorie intake. - Continue drinking boost or Ensure daily. - Encouraged her to eat at least 3 meals per day plus snacks. - Continue to monitor.  5.  Sore throat - I advised patient to talk with her PCP about possible ENT referral / evaluation.   Ellouise Console, PA-C  Follow up in 3 months.

## 2024-01-12 ENCOUNTER — Encounter: Payer: Self-pay | Admitting: Physician Assistant

## 2024-01-12 ENCOUNTER — Ambulatory Visit (INDEPENDENT_AMBULATORY_CARE_PROVIDER_SITE_OTHER): Admitting: Physician Assistant

## 2024-01-12 VITALS — BP 102/74 | Ht 61.0 in | Wt 95.8 lb

## 2024-01-12 DIAGNOSIS — K2930 Chronic superficial gastritis without bleeding: Secondary | ICD-10-CM

## 2024-01-12 DIAGNOSIS — R634 Abnormal weight loss: Secondary | ICD-10-CM | POA: Diagnosis not present

## 2024-01-12 DIAGNOSIS — R110 Nausea: Secondary | ICD-10-CM | POA: Diagnosis not present

## 2024-01-12 MED ORDER — SUCRALFATE 1 G PO TABS: 1.0000 g | ORAL_TABLET | Freq: Three times a day (TID) | ORAL | 2 refills | Status: DC

## 2024-01-12 NOTE — Patient Instructions (Signed)
 You have been scheduled for a gastric emptying scan at [facility] on 01/30/24 at 8:00am. Please arrive at least 30 minutes prior to your appointment for registration. Please make certain not to have anything to eat or drink after midnight the night before your test. Hold all stomach medications (ex: Zofran , phenergan , Reglan , Sucralfate ) 24 hours prior to your test. If you need to reschedule your appointment, please contact radiology scheduling at [phone]. _____________________________________________________________________ A gastric-emptying study measures how long it takes for food to move through your stomach. There are several ways to measure stomach emptying. In the most common test, you eat food that contains a small amount of radioactive material. A scanner that detects the movement of the radioactive material is placed over your abdomen to monitor the rate at which food leaves your stomach. This test normally takes about 4 hours to complete. _____________________________________________________________________  We have sent the following medications to your pharmacy for you to pick up at your convenience: Sucralfate  1 gram 3 times daily  Please follow up sooner if symptoms increase or worsen  Due to recent changes in healthcare laws, you may see the results of your imaging and laboratory studies on MyChart before your provider has had a chance to review them.  We understand that in some cases there may be results that are confusing or concerning to you. Not all laboratory results come back in the same time frame and the provider may be waiting for multiple results in order to interpret others.  Please give us  48 hours in order for your provider to thoroughly review all the results before contacting the office for clarification of your results.   Thank you for trusting me with your gastrointestinal care!   Ellouise Console,  PA-C _______________________________________________________  If your blood pressure at your visit was 140/90 or greater, please contact your primary care physician to follow up on this.  _______________________________________________________  If you are age 62 or older, your body mass index should be between 23-30. Your Body mass index is 18.1 kg/m. If this is out of the aforementioned range listed, please consider follow up with your Primary Care Provider.  If you are age 64 or younger, your body mass index should be between 19-25. Your Body mass index is 18.1 kg/m. If this is out of the aformentioned range listed, please consider follow up with your Primary Care Provider.   ________________________________________________________  The [facility] providers would like to encourage you to use MYCHART to communicate with providers for non-urgent requests or questions.  Due to long hold times on the telephone, sending your provider a message by [facility] may be a faster and more efficient way to get a response.  Please allow 48 business hours for a response.  Please remember that this is for non-urgent requests.  _______________________________________________________

## 2024-01-30 ENCOUNTER — Encounter (HOSPITAL_COMMUNITY)
Admission: RE | Admit: 2024-01-30 | Discharge: 2024-01-30 | Disposition: A | Discharge status: Home / Self Care | Source: Ambulatory Visit | Attending: Physician Assistant | Admitting: Physician Assistant

## 2024-01-30 DIAGNOSIS — R634 Abnormal weight loss: Secondary | ICD-10-CM | POA: Insufficient documentation

## 2024-01-30 DIAGNOSIS — K2930 Chronic superficial gastritis without bleeding: Secondary | ICD-10-CM | POA: Diagnosis present

## 2024-01-30 DIAGNOSIS — R110 Nausea: Secondary | ICD-10-CM | POA: Insufficient documentation

## 2024-01-30 MED ORDER — TECHNETIUM TC 99M SULFUR COLLOID
2.1000 | Freq: Once | INTRAVENOUS | Status: AC | PRN
  Administered 2024-01-30: 2.1 via ORAL

## 2024-02-03 ENCOUNTER — Ambulatory Visit: Payer: Self-pay | Admitting: Physician Assistant

## 2024-03-10 ENCOUNTER — Encounter (HOSPITAL_BASED_OUTPATIENT_CLINIC_OR_DEPARTMENT_OTHER): Payer: Self-pay

## 2024-03-10 ENCOUNTER — Ambulatory Visit (HOSPITAL_BASED_OUTPATIENT_CLINIC_OR_DEPARTMENT_OTHER)
Admission: RE | Admit: 2024-03-10 | Discharge: 2024-03-10 | Disposition: A | Discharge status: Home / Self Care | Source: Ambulatory Visit | Attending: Family Medicine | Admitting: Family Medicine

## 2024-03-10 VITALS — BP 116/80 | HR 87 | Temp 98.3°F | Resp 20

## 2024-03-10 DIAGNOSIS — R300 Dysuria: Secondary | ICD-10-CM | POA: Diagnosis present

## 2024-03-10 LAB — POCT URINE DIPSTICK
Bilirubin, UA: NEGATIVE
Blood, UA: NEGATIVE
Glucose, UA: NEGATIVE mg/dL
Ketones, POC UA: NEGATIVE mg/dL
Leukocytes, UA: NEGATIVE
Nitrite, UA: NEGATIVE
POC PROTEIN,UA: NEGATIVE
Spec Grav, UA: 1.015 (ref 1.010–1.025)
Urobilinogen, UA: 0.2 U/dL
pH, UA: 6.5 (ref 5.0–8.0)

## 2024-03-10 NOTE — ED Provider Notes (Signed)
 [facility]    CSN: [id number] Arrival date & time: 03/10/24  1259      History   Chief Complaint Chief Complaint  Patient presents with   Generalized Body Aches    Pain when I urinate. Fatigue. Continued nausea. Ears roaring. - Entered by patient   Dysuria    HPI Sierra Morris is a 63 y.o. female.   63 year old female who presents today with urinary complaints.  Pt has had burning w/ urination and urgency that started around 3 days. Pt reports that she's been having nausea, roaring in head for several months w/  continued testing being performed. Pt reports body aches ongoing for several months as well. Pt here to have urinary symptoms addressed.  No fever or back pain    Dysuria   Past Medical History:  Diagnosis Date   Anorexia nervosa    patient denies   Anxiety    Arthritis    Cataract    Chronic depression    Chronic headaches    Colitis    Constipation due to opioid therapy    Diverticulosis    Emphysema/COPD [facility])    patient unaware   Fibromyalgia    GERD (gastroesophageal reflux disease)    History of blood in urine    Hyperthyroidism    borderline per patient   MS (multiple sclerosis) (HCC)    Osteoarthritis    Osteoporosis    Pituitary microadenoma (HCC)    PONV (postoperative nausea and vomiting)    Seizure (HCC)    Sleep disorder    Tremor    Tubular adenoma of colon     Patient Active Problem List   Diagnosis Date Noted   Dyslipidemia 01/05/2024   Syncope and collapse 10/22/2023   Smoker 10/21/2023   Raynaud's disease 10/21/2023   Senile osteoporosis 04/22/2022   Multiple sclerosis (HCC) 01/12/2019   Fibromyalgia 01/12/2019   Chronic depression 01/12/2019   Attention deficit hyperactivity disorder, 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   Anxiety 01/12/2019   Cervical disc disorder at C5-C6 level with radiculopathy 11/18/2017   Adenomatous colon polyp 02/07/2015   Diverticulosis of colon without hemorrhage 02/07/2015    Chronic constipation 02/07/2015   Pelvic pain in female 11/30/2014    Past Surgical History:  Procedure Laterality Date   ABDOMINAL HYSTERECTOMY  2008   APPENDECTOMY     CESAREAN SECTION     x 2   CHOLECYSTECTOMY     COLONOSCOPY     DENTAL SURGERY     LAPAROSCOPIC BILATERAL SALPINGO OOPHERECTOMY Bilateral 11/30/2014   Procedure: LAPAROSCOPIC BILATERAL SALPINGO OOPHORECTOMY;  Surgeon: Charlie Croak, MD;  Location: [facility];  Service: Gynecology;  Laterality: Bilateral;  converted to open   LAPAROSCOPY     LAPAROTOMY N/A 11/30/2014   Procedure: POSSIBLE EXPLORATORY LAPAROTOMY, ;  Surgeon: Charlie Croak, MD;  Location: [facility];  Service: Gynecology;  Laterality: N/A;   NECK SURGERY     OOPHORECTOMY     SALPINGOOPHORECTOMY Bilateral 11/30/2014   Procedure: BILATERAL SALPINGO OOPHORECTOMY;  Surgeon: Charlie Croak, MD;  Location: [facility];  Service: Gynecology;  Laterality: Bilateral;   TONSILLECTOMY     tumor breast bone removal benign      OB History     Gravida  3   Para  2   Term  2   Preterm      AB  1   Living  3      SAB      IAB  Ectopic  1   Multiple  1   Live Births  3            Home Medications    Prior to Admission medications   Medication Sig Start Date End Date Taking? Authorizing Provider  baclofen (LIORESAL) 10 MG tablet Take 10 mg by mouth 3 (three) times daily. 11/11/17   [provider]  clonazePAM  (KLONOPIN ) 0.5 MG tablet Take 1 mg by mouth daily.    [provider]  estrogen-methylTESTOSTERone 0.625-1.25 MG tablet Take 1 tablet by mouth every other day.    [provider]  FLUoxetine (PROZAC) 20 MG capsule Take 20 mg by mouth daily. 07/07/23   [provider]  HYDROcodone -acetaminophen  (NORCO/VICODIN) 5-325 MG tablet Take 1 tablet by mouth daily.    [provider]  hydrOXYzine (ATARAX/VISTARIL) 25 MG tablet Take 25 mg by mouth 3 (three) times daily as needed. 05/24/15   [provider]  LINZESS  145 MCG CAPS capsule Take 145 mcg by mouth every morning. 10/26/23   [provider]  methylphenidate  (RITALIN ) 20 MG tablet Take 20 mg by mouth 3 (three) times daily. 12/26/15   [provider]  ondansetron  (ZOFRAN ) 8 MG tablet Take 8 mg by mouth 3 (three) times daily. 11/09/23   [provider]  pantoprazole  (PROTONIX ) 40 MG tablet Take 1 tablet (40 mg total) by mouth daily. 12/12/23   Pyrtle, Gordy HERO, MD  sucralfate  (CARAFATE ) 1 g tablet Take 1 tablet (1 g total) by mouth 3 (three) times daily before meals. 01/12/24 04/11/24  [place], PA-C  ZEPOSIA 0.92 MG CAPS Take 1 capsule by mouth daily. 07/10/23   [provider]  diphenoxylate -atropine  (LOMOTIL ) 2.5-0.025 MG tablet SMARTSIG:1 Tablet(s) By Mouth 1-4 Times Daily 01/03/22   [provider]    Family History Family History  Problem Relation Age of Onset   Colon polyps Mother    Hypertension Mother    Depression Father    Diabetes Father    Colon polyps Father    COPD Father    Diabetes Maternal Grandmother    Colon cancer Paternal Grandmother    Rectal cancer Paternal Grandmother    Pancreatitis Son    Irritable bowel syndrome Other        cousin   Esophageal cancer Neg Hx    Stomach cancer Neg Hx    Pancreatic cancer Neg Hx     Social History Social History   Tobacco Use   Smoking status: Some Days    Current packs/day: 0.25    Average packs/day: 0.3 packs/day for 0.5 years (0.1 ttl pk-yrs)    Types: Cigarettes   Smokeless tobacco: Never  Vaping Use   Vaping status: Some Days  Substance Use Topics   Alcohol use: Yes    Alcohol/week: 0.0 standard drinks of alcohol    Comment: rare   Drug use: No     Allergies   Aubagio [teriflunomide], Wheat, Carbidopa, Prednisone, Benadryl  [diphenhydramine ], Buprenorphine hcl-naloxone  hcl, Paroxetine, Paroxetine hcl, Paroxetine hcl, Polyethylene glycol, Pregabalin, and Suboxone [buprenorphine hcl-naloxone   hcl]   Review of Systems Review of Systems  Genitourinary:  Positive for dysuria.   See HPI  Physical Exam Triage Vital Signs ED Triage Vitals  Encounter Vitals Group     BP 03/10/24 1323 116/80     Girls Systolic BP Percentile --      Girls Diastolic BP Percentile --      Boys Systolic BP Percentile --  Boys Diastolic BP Percentile --      Pulse Rate 03/10/24 1323 87     Resp 03/10/24 1323 20     Temp 03/10/24 1323 98.3 F (36.8 C)     Temp src --      SpO2 03/10/24 1323 98 %     Weight --      Height --      Head Circumference --      Peak Flow --      Pain Score 03/10/24 1324 7     Pain Loc --      Pain Education --      Exclude from Growth Chart --    No data found.  Updated Vital Signs BP 116/80 (BP Location: Right Arm)   Pulse 87   Temp 98.3 F (36.8 C)   Resp 20   SpO2 98%   Visual Acuity Right Eye Distance:   Left Eye Distance:   Bilateral Distance:    Right Eye Near:   Left Eye Near:    Bilateral Near:     Physical Exam Vitals and nursing note reviewed.  Constitutional:      General: She is not in acute distress.    Appearance: Normal appearance. She is not ill-appearing, toxic-appearing or diaphoretic.  Pulmonary:     Effort: Pulmonary effort is normal.  Neurological:     Mental Status: She is alert.  Psychiatric:        Mood and Affect: Mood normal.      UC Treatments / Results  Labs (all labs ordered are listed, but only abnormal results are displayed) Labs Reviewed  URINE CULTURE  POCT URINE DIPSTICK    EKG   Radiology No results found.  Procedures Procedures (including critical care time)  Medications Ordered in UC Medications - No data to display  Initial Impression / Assessment and Plan / UC Course  I have reviewed the triage vital signs and the nursing notes.  Pertinent labs & imaging results that were available during my care of the patient were reviewed by me and considered in my medical decision making  (see chart for details).     Dysuria-urine without any concern for urinary tract infection at this time.  We will culture to be safe.  Whenever reasons or other potential causes of dysuria.  Recommend push fluids.  Follow-up as needed Final Clinical Impressions(s) / UC Diagnoses   Final diagnoses:  Dysuria     Discharge Instructions      Your urine did not show any signs of infection but we will culture the urine. Follow-up as needed    ED Prescriptions   None    PDMP not reviewed this encounter.   Adah Wilbert LABOR, FNP 03/10/24 1348

## 2024-03-10 NOTE — ED Triage Notes (Signed)
 Pt has had burning w/ urination and urgency that started around 3 days. Pt reports that she's been having nausea, roaring in head for several months w/  continued testing being performed. Pt reports body aches ongoing for several months as well. Pt here to have urinary symptoms addressed.

## 2024-03-10 NOTE — Discharge Instructions (Signed)
 Your urine did not show any signs of infection but we will culture the urine. Follow-up as needed

## 2024-03-12 ENCOUNTER — Ambulatory Visit (HOSPITAL_COMMUNITY): Payer: Self-pay

## 2024-03-12 LAB — URINE CULTURE: Culture: <10000 — AB

## 2024-03-13 ENCOUNTER — Other Ambulatory Visit: Payer: Self-pay | Admitting: Internal Medicine

## 2024-03-16 ENCOUNTER — Other Ambulatory Visit (HOSPITAL_BASED_OUTPATIENT_CLINIC_OR_DEPARTMENT_OTHER): Payer: Self-pay

## 2024-03-16 ENCOUNTER — Encounter (HOSPITAL_BASED_OUTPATIENT_CLINIC_OR_DEPARTMENT_OTHER): Payer: Self-pay

## 2024-03-16 ENCOUNTER — Ambulatory Visit (HOSPITAL_BASED_OUTPATIENT_CLINIC_OR_DEPARTMENT_OTHER)
Admission: RE | Admit: 2024-03-16 | Discharge: 2024-03-16 | Disposition: A | Discharge status: Home / Self Care | Source: Ambulatory Visit | Attending: Family Medicine | Admitting: Family Medicine

## 2024-03-16 VITALS — BP 125/80 | HR 84 | Temp 98.5°F | Resp 20

## 2024-03-16 DIAGNOSIS — N898 Other specified noninflammatory disorders of vagina: Secondary | ICD-10-CM | POA: Diagnosis present

## 2024-03-16 LAB — POCT URINE DIPSTICK
Bilirubin, UA: NEGATIVE
Blood, UA: NEGATIVE
Glucose, UA: NEGATIVE mg/dL
Ketones, POC UA: NEGATIVE mg/dL
Leukocytes, UA: NEGATIVE
Nitrite, UA: NEGATIVE
Protein Ur, POC: NEGATIVE mg/dL
Spec Grav, UA: 1.015 (ref 1.010–1.025)
Urobilinogen, UA: 0.2 U/dL
pH, UA: 6 (ref 5.0–8.0)

## 2024-03-16 MED ORDER — METRONIDAZOLE 0.75 % VA GEL
1.0000 | Freq: Every day | VAGINAL | 0 refills | Status: AC
  Filled 2024-03-16: qty 70, 5d supply, fill #0

## 2024-03-16 MED ORDER — NYSTATIN 100000 UNIT/GM EX CREA
TOPICAL_CREAM | CUTANEOUS | 0 refills | Status: DC
Start: 2024-03-16 — End: 2024-04-07
  Filled 2024-03-16: qty 30, 15d supply, fill #0

## 2024-03-16 NOTE — ED Provider Notes (Signed)
 [name]    CSN: [id number] Arrival date & time: 03/16/24  1338      History   Chief Complaint Chief Complaint  Patient presents with   Abdominal Pain    HPI Sierra [facility] is a 63 y.o. female.   Patient is a 63 year old female who presents today with lower abdominal pain, vaginal discharge.  Pt states she was seen on 03/10/24 for dysuria and states her symptoms have continued to get worse. She is now having lower abdominal and low back pain, urinary frequency, urgency, and a yellow discharge. Pt has not taken anything for her symptoms.  Urinalysis normal at previous visit with negative culture.  Denies any specific concerns for STDs.  Denies any itching, irritation or vaginal bleeding.  History of total hysterectomy.    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[facility])    patient unaware   Fibromyalgia    GERD (gastroesophageal reflux disease)    History of blood in urine    Hyperthyroidism    borderline per patient   MS (multiple sclerosis) (HCC)    Osteoarthritis    Osteoporosis    Pituitary microadenoma (HCC)    PONV (postoperative nausea and vomiting)    Seizure (HCC)    Sleep disorder    Tremor    Tubular adenoma of colon     Patient Active Problem List   Diagnosis Date Noted   Dyslipidemia 01/05/2024   Syncope and collapse 10/22/2023   Smoker 10/21/2023   Raynaud's disease 10/21/2023   Senile osteoporosis 04/22/2022   Multiple sclerosis (HCC) 01/12/2019   Fibromyalgia 01/12/2019   Chronic depression 01/12/2019   Attention deficit hyperactivity disorder, 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   Anxiety 01/12/2019   Cervical disc disorder at C5-C6 level with radiculopathy 11/18/2017   Adenomatous colon polyp 02/07/2015    Diverticulosis of colon without hemorrhage 02/07/2015   Chronic constipation 02/07/2015   Pelvic pain in female 11/30/2014    Past Surgical History:  Procedure Laterality Date   ABDOMINAL HYSTERECTOMY  2008   APPENDECTOMY     CESAREAN SECTION     x 2   CHOLECYSTECTOMY     COLONOSCOPY     DENTAL SURGERY     LAPAROSCOPIC BILATERAL SALPINGO OOPHERECTOMY Bilateral 11/30/2014   Procedure: LAPAROSCOPIC BILATERAL SALPINGO OOPHORECTOMY;  Surgeon: Charlie Croak, MD;  Location: [facility];  Service: Gynecology;  Laterality: Bilateral;  converted to open   LAPAROSCOPY     LAPAROTOMY N/A 11/30/2014   Procedure: POSSIBLE EXPLORATORY LAPAROTOMY, ;  Surgeon: Charlie Croak, MD;  Location: [facility];  Service: Gynecology;  Laterality: N/A;   NECK SURGERY     OOPHORECTOMY     SALPINGOOPHORECTOMY Bilateral 11/30/2014   Procedure: BILATERAL SALPINGO OOPHORECTOMY;  Surgeon: Charlie Croak, MD;  Location: [facility];  Service: Gynecology;  Laterality: Bilateral;   TONSILLECTOMY     tumor breast bone removal benign      OB History     Gravida  3   Para  2   Term  2   Preterm      AB  1   Living  3      SAB      IAB  Ectopic  1   Multiple  1   Live Births  3            Home Medications    Prior to Admission medications   Medication Sig Start Date End Date Taking? Authorizing Provider  metroNIDAZOLE  (METROGEL ) 0.75 % vaginal gel Place 1 Applicatorful vaginally at bedtime for 5 days. 03/16/24 03/21/24 Yes Carla Rashad, Wilbert A, FNP  nystatin  cream (MYCOSTATIN ) Apply to affected area 2 times daily 03/16/24  Yes Ranelle Auker A, FNP  baclofen (LIORESAL) 10 MG tablet Take 10 mg by mouth 3 (three) times daily. 11/11/17   [provider]  clonazePAM  (KLONOPIN ) 0.5 MG tablet Take 1 mg by mouth daily.    [provider]  estrogen-methylTESTOSTERone 0.625-1.25 MG tablet Take 1 tablet by mouth every other day.    [provider]  FLUoxetine (PROZAC) 20 MG capsule Take 20 mg by  mouth daily. 07/07/23   [provider]  HYDROcodone -acetaminophen  (NORCO/VICODIN) 5-325 MG tablet Take 1 tablet by mouth daily.    [provider]  hydrOXYzine (ATARAX/VISTARIL) 25 MG tablet Take 25 mg by mouth 3 (three) times daily as needed. 05/24/15   [provider]  LINZESS  145 MCG CAPS capsule TAKE 1 CAPSULE BY MOUTH EVERY DAY BEFORE BREAKFAST 03/15/24   [place], PA-C  methylphenidate  (RITALIN ) 20 MG tablet Take 20 mg by mouth 3 (three) times daily. 12/26/15   [provider]  ondansetron  (ZOFRAN ) 8 MG tablet Take 8 mg by mouth 3 (three) times daily. 11/09/23   [provider]  pantoprazole  (PROTONIX ) 40 MG tablet Take 1 tablet (40 mg total) by mouth daily. 12/12/23   Pyrtle, Gordy HERO, MD  sucralfate  (CARAFATE ) 1 g tablet Take 1 tablet (1 g total) by mouth 3 (three) times daily before meals. 01/12/24 04/11/24  [place], PA-C  ZEPOSIA 0.92 MG CAPS Take 1 capsule by mouth daily. 07/10/23   [provider]  diphenoxylate -atropine  (LOMOTIL ) 2.5-0.025 MG tablet SMARTSIG:1 Tablet(s) By Mouth 1-4 Times Daily 01/03/22   [provider]    Family History Family History  Problem Relation Age of Onset   Colon polyps Mother    Hypertension Mother    Depression Father    Diabetes Father    Colon polyps Father    COPD Father    Diabetes Maternal Grandmother    Colon cancer Paternal Grandmother    Rectal cancer Paternal Grandmother    Pancreatitis Son    Irritable bowel syndrome Other        cousin   Esophageal cancer Neg Hx    Stomach cancer Neg Hx    Pancreatic cancer Neg Hx     Social History Social History   Tobacco Use   Smoking status: Some Days    Current packs/day: 0.25    Average packs/day: 0.3 packs/day for 0.5 years (0.1 ttl pk-yrs)    Types: Cigarettes   Smokeless tobacco: Never  Vaping Use   Vaping status: Some Days  Substance Use Topics   Alcohol use: Yes    Alcohol/week: 0.0 standard drinks of alcohol     Comment: rare   Drug use: No     Allergies   Aubagio [teriflunomide], Wheat, Carbidopa, Prednisone, Benadryl  [diphenhydramine ], Buprenorphine hcl-naloxone  hcl, Paroxetine, Paroxetine hcl, Paroxetine hcl, Polyethylene glycol, Pregabalin, and Suboxone [buprenorphine hcl-naloxone  hcl]   Review of Systems Review of Systems  Gastrointestinal:  Positive for abdominal pain.     Physical Exam Triage Vital Signs ED Triage Vitals  Encounter Vitals  Group     BP 03/16/24 1354 125/80     Girls Systolic BP Percentile --      Girls Diastolic BP Percentile --      Boys Systolic BP Percentile --      Boys Diastolic BP Percentile --      Pulse Rate 03/16/24 1354 84     Resp 03/16/24 1354 20     Temp 03/16/24 1354 98.5 F (36.9 C)     Temp Source 03/16/24 1354 Oral     SpO2 03/16/24 1354 98 %     Weight --      Height --      Head Circumference --      Peak Flow --      Pain Score 03/16/24 1352 7     Pain Loc --      Pain Education --      Exclude from Growth Chart --    No data found.  Updated Vital Signs BP 125/80 (BP Location: Right Arm)   Pulse 84   Temp 98.5 F (36.9 C) (Oral)   Resp 20   SpO2 98%   Visual Acuity Right Eye Distance:   Left Eye Distance:   Bilateral Distance:    Right Eye Near:   Left Eye Near:    Bilateral Near:     Physical Exam Vitals and nursing note reviewed.  Constitutional:      General: She is not in acute distress.    Appearance: Normal appearance. She is not ill-appearing, toxic-appearing or diaphoretic.  Pulmonary:     Effort: Pulmonary effort is normal.  Neurological:     Mental Status: She is alert.  Psychiatric:        Mood and Affect: Mood normal.      UC Treatments / Results  Labs (all labs ordered are listed, but only abnormal results are displayed) Labs Reviewed  POCT URINE DIPSTICK - Normal  CERVICOVAGINAL ANCILLARY ONLY    EKG   Radiology No results found.  Procedures Procedures (including critical care  time)  Medications Ordered in UC Medications - No data to display  Initial Impression / Assessment and Plan / UC Course  I have reviewed the triage vital signs and the nursing notes.  Pertinent labs & imaging results that were available during my care of the patient were reviewed by me and considered in my medical decision making (see chart for details).     Vaginal discharge- urine normal. Swab pending  Treating you for potential bacterial and yeast infection.  Use the applicator and cream as prescribed.  Follow-up as needed with your OB/GYN for any continued issues Final Clinical Impressions(s) / UC Diagnoses   Final diagnoses:  Vaginal discharge     Discharge Instructions      Treating you for potential bacterial and yeast infection.  Use the applicator and cream as prescribed.  Follow-up as needed with your OB/GYN for any continued issues    ED Prescriptions     Medication Sig Dispense Auth. Provider   metroNIDAZOLE  (METROGEL ) 0.75 % vaginal gel Place 1 Applicatorful vaginally at bedtime for 5 days. 70 g Roshelle Traub A, FNP   nystatin  cream (MYCOSTATIN ) Apply to affected area 2 times daily 30 g Adah Corning A, FNP      PDMP not reviewed this encounter.   Adah Corning LABOR, FNP 03/19/24 [phone]

## 2024-03-16 NOTE — ED Triage Notes (Signed)
 Pt states she was seen on 03/10/24 for dysuria and states her symptoms have continued to get worse. She is now having lower abdominal and low back pain, urinary frequency, urgency, and a yellow discharge. Pt has not taken anything for her symptoms.

## 2024-03-16 NOTE — Discharge Instructions (Signed)
 Treating you for potential bacterial and yeast infection.  Use the applicator and cream as prescribed.  Follow-up as needed with your OB/GYN for any continued issues

## 2024-03-17 LAB — CERVICOVAGINAL ANCILLARY ONLY
Bacterial Vaginitis (gardnerella): NEGATIVE
Candida Glabrata: NEGATIVE
Candida Vaginitis: NEGATIVE
Chlamydia: NEGATIVE
Comment: NEGATIVE
Comment: NEGATIVE
Comment: NEGATIVE
Comment: NEGATIVE
Comment: NEGATIVE
Comment: NORMAL
Neisseria Gonorrhea: NEGATIVE
Trichomonas: NEGATIVE

## 2024-03-23 ENCOUNTER — Encounter: Payer: Self-pay | Admitting: Family

## 2024-03-24 ENCOUNTER — Other Ambulatory Visit: Payer: Self-pay | Admitting: Obstetrics and Gynecology

## 2024-03-24 DIAGNOSIS — R102 Pelvic and perineal pain: Secondary | ICD-10-CM

## 2024-04-01 ENCOUNTER — Ambulatory Visit
Admission: RE | Admit: 2024-04-01 | Discharge: 2024-04-01 | Disposition: A | Discharge status: Home / Self Care | Source: Ambulatory Visit | Attending: Obstetrics and Gynecology | Admitting: Obstetrics and Gynecology

## 2024-04-01 DIAGNOSIS — R102 Pelvic and perineal pain: Secondary | ICD-10-CM

## 2024-04-01 MED ORDER — IOPAMIDOL (ISOVUE-300) INJECTION 61%
75.0000 mL | Freq: Once | INTRAVENOUS | Status: AC | PRN
  Administered 2024-04-01: 75 mL via INTRAVENOUS

## 2024-04-07 ENCOUNTER — Inpatient Hospital Stay: Attending: Hematology and Oncology | Admitting: Hematology and Oncology

## 2024-04-07 ENCOUNTER — Other Ambulatory Visit: Payer: Self-pay

## 2024-04-07 ENCOUNTER — Other Ambulatory Visit: Payer: Self-pay | Admitting: Hematology and Oncology

## 2024-04-07 ENCOUNTER — Encounter: Payer: Self-pay | Admitting: Hematology and Oncology

## 2024-04-07 ENCOUNTER — Telehealth: Payer: Self-pay | Admitting: Hematology and Oncology

## 2024-04-07 ENCOUNTER — Inpatient Hospital Stay

## 2024-04-07 VITALS — BP 115/75 | HR 65 | Temp 98.1°F | Resp 18 | Ht 60.0 in | Wt 95.8 lb

## 2024-04-07 DIAGNOSIS — G479 Sleep disorder, unspecified: Secondary | ICD-10-CM | POA: Insufficient documentation

## 2024-04-07 DIAGNOSIS — G35D Multiple sclerosis, unspecified: Secondary | ICD-10-CM | POA: Diagnosis not present

## 2024-04-07 DIAGNOSIS — D72810 Lymphocytopenia: Secondary | ICD-10-CM

## 2024-04-07 DIAGNOSIS — M810 Age-related osteoporosis without current pathological fracture: Secondary | ICD-10-CM | POA: Diagnosis not present

## 2024-04-07 DIAGNOSIS — Z860101 Personal history of adenomatous and serrated colon polyps: Secondary | ICD-10-CM | POA: Insufficient documentation

## 2024-04-07 DIAGNOSIS — R946 Abnormal results of thyroid function studies: Secondary | ICD-10-CM | POA: Insufficient documentation

## 2024-04-07 DIAGNOSIS — E611 Iron deficiency: Secondary | ICD-10-CM | POA: Insufficient documentation

## 2024-04-07 DIAGNOSIS — E0590 Thyrotoxicosis, unspecified without thyrotoxic crisis or storm: Secondary | ICD-10-CM | POA: Diagnosis not present

## 2024-04-07 DIAGNOSIS — D72818 Other decreased white blood cell count: Secondary | ICD-10-CM | POA: Insufficient documentation

## 2024-04-07 DIAGNOSIS — F329 Major depressive disorder, single episode, unspecified: Secondary | ICD-10-CM | POA: Diagnosis not present

## 2024-04-07 DIAGNOSIS — R718 Other abnormality of red blood cells: Secondary | ICD-10-CM | POA: Diagnosis not present

## 2024-04-07 DIAGNOSIS — J439 Emphysema, unspecified: Secondary | ICD-10-CM | POA: Insufficient documentation

## 2024-04-07 DIAGNOSIS — Z86018 Personal history of other benign neoplasm: Secondary | ICD-10-CM | POA: Insufficient documentation

## 2024-04-07 DIAGNOSIS — F17210 Nicotine dependence, cigarettes, uncomplicated: Secondary | ICD-10-CM | POA: Insufficient documentation

## 2024-04-07 LAB — CBC WITH DIFFERENTIAL (CANCER CENTER ONLY)
Abs Immature Granulocytes: 0.02 K/uL (ref 0.00–0.07)
Basophils Absolute: 0 K/uL (ref 0.0–0.1)
Basophils Relative: 1 %
Eosinophils Absolute: 0.1 K/uL (ref 0.0–0.5)
Eosinophils Relative: 2 %
HCT: 44.5 % (ref 36.0–46.0)
Hemoglobin: 14.4 g/dL (ref 12.0–15.0)
Immature Granulocytes: 1 %
Lymphocytes Relative: 14 %
Lymphs Abs: 0.6 K/uL — ABNORMAL LOW (ref 0.7–4.0)
MCH: 29 pg (ref 26.0–34.0)
MCHC: 32.4 g/dL (ref 30.0–36.0)
MCV: 89.7 fL (ref 80.0–100.0)
Monocytes Absolute: 0.5 K/uL (ref 0.1–1.0)
Monocytes Relative: 12 %
Neutro Abs: 2.9 K/uL (ref 1.7–7.7)
Neutrophils Relative %: 70 %
Platelet Count: 271 K/uL (ref 150–400)
RBC: 4.96 MIL/uL (ref 3.87–5.11)
RDW: 15.3 % (ref 11.5–15.5)
WBC Count: 4.2 K/uL (ref 4.0–10.5)
nRBC: 0 % (ref 0.0–0.2)

## 2024-04-07 LAB — CMP (CANCER CENTER ONLY)
ALT: 33 U/L (ref 0–44)
AST: 31 U/L (ref 15–41)
Albumin: 4.6 g/dL (ref 3.5–5.0)
Alkaline Phosphatase: 92 U/L (ref 38–126)
Anion gap: 10 (ref 5–15)
BUN: 12 mg/dL (ref 8–23)
CO2: 24 mmol/L (ref 22–32)
Calcium: 9.6 mg/dL (ref 8.9–10.3)
Chloride: 105 mmol/L (ref 98–111)
Creatinine: 0.78 mg/dL (ref 0.44–1.00)
GFR, Estimated: >60 mL/min (ref >60)
Glucose, Bld: 88 mg/dL (ref 70–99)
Potassium: 4.8 mmol/L (ref 3.5–5.1)
Sodium: 139 mmol/L (ref 135–145)
Total Bilirubin: 0.2 mg/dL (ref 0.0–1.2)
Total Protein: 7.3 g/dL (ref 6.5–8.1)

## 2024-04-07 LAB — IRON AND TIBC
Iron: 76 ug/dL (ref 28–170)
Saturation Ratios: 18 % (ref 10.4–31.8)
TIBC: 417 ug/dL (ref 250–450)
UIBC: 341 ug/dL

## 2024-04-07 LAB — SEDIMENTATION RATE: Sed Rate: 0 mm/h (ref 0–22)

## 2024-04-07 LAB — FERRITIN: Ferritin: 95 ng/mL (ref 11–307)

## 2024-04-07 LAB — FOLATE: Folate: 11.1 ng/mL (ref >5.9)

## 2024-04-07 LAB — VITAMIN B12: Vitamin B-12: 511 pg/mL (ref 180–914)

## 2024-04-07 LAB — TSH: TSH: 1.07 u[IU]/mL (ref 0.350–4.500)

## 2024-04-07 NOTE — Telephone Encounter (Signed)
 Contacted pt to schedule an appt per 04/07/24 LOS. Pt was not home, requested I give her a call in the morning to schedule.

## 2024-04-07 NOTE — Progress Notes (Unsigned)
 [facility] [address] [place],  [place]  [address] [phone]  Clinic Day:  04/07/2024   Referring physician: Clemmie Nest, MD  Patient Care Team: Patient Care Team: Clemmie Nest, MD as PCP - General (Family Medicine)   REASON FOR CONSULTATION:  Decreased lymphocytes  HISTORY OF PRESENT ILLNESS:   Sierra Morris is a 63 y.o. female with a history of decrease lymphocytes who is referred in consultation by Nest Clemmie, MD for assessment and management. She denies having been told before that she has any abnormality of her CBC. She denies fever, chills, nausea or vomiting. She denies shortness of breath, chest pain or cough.  She is a light tobacco smoker with rare alcohol use. She is disabled with 3 children.   Medical history includes emphysema, osteoporosis, hyperthyroidism, osteoarthritis, sleep disorder, depression, chronic back pain, Multiple sclerosis, pituitary microadenoma, anorexia,   Surgical history includes hysterectomy, tonsillectomy, dental surgery, oophorectomy, cholecystectomy, C-section x 2, adenoidectomy, appendectomy  Family history includes diabetes mellitus, depression, colon cancer, hypertension, colon polyp, COPD, Parkinson, Alzheimer disease   REVIEW OF SYSTEMS:   Review of Systems - Oncology   VITALS:   There were no vitals taken for this visit.  Wt Readings from Last 3 Encounters:  01/12/24 95 lb 12.8 oz (43.5 kg)  01/05/24 97 lb 12.8 oz (44.4 kg)  12/10/23 100 lb (45.4 kg)    There is no height or weight on file to calculate BMI.  Performance status (ECOG): {CHL ONC [id number]  PHYSICAL EXAM:   Physical Exam   LABS:      Latest Ref Rng & Units 11/28/2023    3:30 PM 01/16/2022   10:25 AM 03/27/2017    2:49 PM  CBC  WBC 4.0 - 10.5 K/uL 3.4  3.5  7.0   Hemoglobin 12.0 - 15.0 g/dL 85.2  86.3  85.3   Hematocrit 36.0 - 46.0 % 44.1  41.5  44.2   Platelets 150.0 - 400.0 K/uL 255.0  217.0  260.0        Latest Ref Rng & Units 11/28/2023    3:30 PM 01/16/2022   10:25 AM 03/27/2017    2:49 PM  CMP  Glucose 70 - 99 mg/dL 82  89  88   BUN 6 - 23 mg/dL 10  13  13    Creatinine 0.40 - 1.20 mg/dL 9.26  9.18  9.18   Sodium 135 - 145 mEq/L 141  137  138   Potassium 3.5 - 5.1 mEq/L 4.3  4.0  4.3   Chloride 96 - 112 mEq/L 104  109  102   CO2 19 - 32 mEq/L 30  23  30    Calcium 8.4 - 10.5 mg/dL 9.5  8.8  89.9   Total Protein 6.0 - 8.3 g/dL 7.0  6.5  7.2   Total Bilirubin 0.2 - 1.2 mg/dL 0.4  0.3  0.3   Alkaline Phos 39 - 117 U/L 54  64  60   AST 0 - 37 U/L 19  21  15    ALT 0 - 35 U/L 27  29  9       No results found for: CEA1, CEA / No results found for: CEA1, CEA No results found for: PSA1 No results found for: CAN199 No results found for: CAN125  No results found for: TOTALPROTELP, ALBUMINELP, A1GS, A2GS, BETS, BETA2SER, GAMS, MSPIKE, SPEI No results found for: TIBC, FERRITIN, IRONPCTSAT No results found for: LDH  STUDIES:   CT ABDOMEN PELVIS  W CONTRAST Result Date: 04/05/2024 CLINICAL DATA:  Pelvic pain and nausea for 5 months. Unintentional weight loss. EXAM: CT ABDOMEN AND PELVIS WITH CONTRAST TECHNIQUE: Multidetector CT imaging of the abdomen and pelvis was performed using the standard protocol following bolus administration of intravenous contrast. RADIATION DOSE REDUCTION: This exam was performed according to the departmental dose-optimization program which includes automated exposure control, adjustment of the mA and/or kV according to patient size and/or use of iterative reconstruction technique. CONTRAST:  75mL ISOVUE -300 IOPAMIDOL  (ISOVUE -300) INJECTION 61% COMPARISON:  12/04/2023 FINDINGS: Lower Chest: No acute findings. Hepatobiliary: No suspicious hepatic masses identified. Prior cholecystectomy. No evidence of biliary obstruction. Pancreas:  No mass or inflammatory changes. Spleen: Within normal limits in size and appearance.  Adrenals/Urinary Tract: No suspicious masses identified. No evidence of ureteral calculi or hydronephrosis. Unremarkable unopacified urinary bladder. Stomach/Bowel: Short-segment small bowel intussusception is seen in proximal ileum in the left upper quadrant. No evidence of bowel obstruction, inflammatory process or abnormal fluid collections. Vascular/Lymphatic: No pathologically enlarged lymph nodes. No acute vascular findings. Reproductive: Prior hysterectomy noted. Adnexal regions are unremarkable in appearance. Other:  None. Musculoskeletal:  No suspicious bone lesions identified. IMPRESSION: Short-segment small bowel intussusception in proximal ileum in left upper quadrant. These are typically idiopathic and self-limited. Consider follow-up with CT enterography in 1 month. No other significant abnormality. Electronically Signed   By: Norleen DELENA Kil M.D.   On: 04/05/2024 12:20      HISTORY:   Past Medical History:  Diagnosis Date   Anorexia nervosa    patient denies   Anxiety    Arthritis    Cataract    Chronic depression    Chronic headaches    Colitis    Constipation due to opioid therapy    Diverticulosis    Emphysema/COPD    patient unaware   Fibromyalgia    GERD (gastroesophageal reflux disease)    History of blood in urine    Hyperthyroidism    borderline per patient   MS (multiple sclerosis)    Osteoarthritis    Osteoporosis    Pituitary microadenoma (HCC)    PONV (postoperative nausea and vomiting)    Seizure (HCC)    Sleep disorder    Tremor    Tubular adenoma of colon     Past Surgical History:  Procedure Laterality Date   ABDOMINAL HYSTERECTOMY  2008   APPENDECTOMY     CESAREAN SECTION     x 2   CHOLECYSTECTOMY     COLONOSCOPY     DENTAL SURGERY     LAPAROSCOPIC BILATERAL SALPINGO OOPHERECTOMY Bilateral 11/30/2014   Procedure: LAPAROSCOPIC BILATERAL SALPINGO OOPHORECTOMY;  Surgeon: Charlie Croak, MD;  Location: [facility];  Service: Gynecology;  Laterality:  Bilateral;  converted to open   LAPAROSCOPY     LAPAROTOMY N/A 11/30/2014   Procedure: POSSIBLE EXPLORATORY LAPAROTOMY, ;  Surgeon: Charlie Croak, MD;  Location: [facility];  Service: Gynecology;  Laterality: N/A;   NECK SURGERY     OOPHORECTOMY     SALPINGOOPHORECTOMY Bilateral 11/30/2014   Procedure: BILATERAL SALPINGO OOPHORECTOMY;  Surgeon: Charlie Croak, MD;  Location: [facility];  Service: Gynecology;  Laterality: Bilateral;   TONSILLECTOMY     tumor breast bone removal benign      Family History  Problem Relation Age of Onset   Colon polyps Mother    Hypertension Mother    Depression Father    Diabetes Father    Colon polyps Father    COPD Father    Diabetes  Maternal Grandmother    Colon cancer Paternal Grandmother    Rectal cancer Paternal Grandmother    Pancreatitis Son    Irritable bowel syndrome Other        cousin   Esophageal cancer Neg Hx    Stomach cancer Neg Hx    Pancreatic cancer Neg Hx     Social History:  reports that she has been smoking cigarettes. She has a 0.1 pack-year smoking history. She has never used smokeless tobacco. She reports current alcohol use. She reports that she does not use drugs.The patient is {Blank single:19197::alone,accompanied by} *** today.  Allergies:  Allergies  Allergen Reactions   Aubagio [Teriflunomide] Other (See Comments)    Abdominal cramping, headaches, blurry vision and mouth sores   Wheat Shortness Of Breath   Carbidopa Other (See Comments)    Nausea and Vomiting    Prednisone Other (See Comments)    Make pt feel crazy   Benadryl  [Diphenhydramine ] Itching    Leg cramps/spasms    Buprenorphine Hcl-Naloxone  Hcl Other (See Comments)    Other reaction(s): hallucinations   Paroxetine Other (See Comments)    Paxil   Paroxetine Hcl Other (See Comments)    Crying, depressed   Paroxetine Hcl Other (See Comments)    Other reaction(s): other  Paxil   Polyethylene Glycol Rash and Itching   Pregabalin Itching    Suboxone [Buprenorphine Hcl-Naloxone  Hcl] Other (See Comments)    Hallucinations            Current Medications: Current Outpatient Medications  Medication Sig Dispense Refill   baclofen (LIORESAL) 10 MG tablet Take 10 mg by mouth 3 (three) times daily.     clonazePAM  (KLONOPIN ) 0.5 MG tablet Take 1 mg by mouth daily.     estrogen-methylTESTOSTERone 0.625-1.25 MG tablet Take 1 tablet by mouth every other day.     FLUoxetine (PROZAC) 20 MG capsule Take 20 mg by mouth daily.     HYDROcodone -acetaminophen  (NORCO/VICODIN) 5-325 MG tablet Take 1 tablet by mouth daily.     hydrOXYzine (ATARAX/VISTARIL) 25 MG tablet Take 25 mg by mouth 3 (three) times daily as needed.     LINZESS  145 MCG CAPS capsule TAKE 1 CAPSULE BY MOUTH EVERY DAY BEFORE BREAKFAST 30 capsule 5   methylphenidate  (RITALIN ) 20 MG tablet Take 20 mg by mouth 3 (three) times daily.  0   nystatin  cream (MYCOSTATIN ) Apply to affected area 2 times daily 30 g 0   ondansetron  (ZOFRAN ) 8 MG tablet Take 8 mg by mouth 3 (three) times daily.     pantoprazole  (PROTONIX ) 40 MG tablet Take 1 tablet (40 mg total) by mouth daily. 90 tablet 3   sucralfate  (CARAFATE ) 1 g tablet Take 1 tablet (1 g total) by mouth 3 (three) times daily before meals. 90 tablet 2   ZEPOSIA 0.92 MG CAPS Take 1 capsule by mouth daily.     No current facility-administered medications for this visit.     ASSESSMENT & PLAN:   Assessment:  AIZAH GEHLHAUSEN is a 63 y.o. female ***  Plan: 1.  ***  I discussed the assessment and treatment plan with the patient.  The patient was provided an opportunity to ask questions and all were answered.  The patient agreed with the plan and demonstrated an understanding of the instructions.    Thank you for the referral    *** minutes was spent in patient care.  This included time spent preparing to see the patient (e.g., review of tests), obtaining  and/or reviewing separately obtained history, counseling and educating the  patient/family/caregiver, ordering medications, tests, or procedures; documenting clinical information in the electronic or other health record, independently interpreting results and communicating results to the patient/family/caregiver as well as coordination of care.      Eleanor DELENA Bach, NP   Physician Assistant [facility]  [phone]

## 2024-04-09 ENCOUNTER — Institutional Professional Consult (permissible substitution) (INDEPENDENT_AMBULATORY_CARE_PROVIDER_SITE_OTHER)

## 2024-04-09 NOTE — Telephone Encounter (Signed)
 Patient has been scheduled. Aware of appt date and time.

## 2024-04-12 ENCOUNTER — Other Ambulatory Visit: Payer: Self-pay | Admitting: Family

## 2024-04-12 DIAGNOSIS — K561 Intussusception: Secondary | ICD-10-CM

## 2024-04-14 ENCOUNTER — Encounter (INDEPENDENT_AMBULATORY_CARE_PROVIDER_SITE_OTHER): Payer: Self-pay

## 2024-04-14 ENCOUNTER — Ambulatory Visit (INDEPENDENT_AMBULATORY_CARE_PROVIDER_SITE_OTHER)

## 2024-04-14 VITALS — BP 120/83 | HR 99 | Temp 98.0°F | Ht 60.0 in | Wt 93.0 lb

## 2024-04-14 DIAGNOSIS — H9313 Tinnitus, bilateral: Secondary | ICD-10-CM

## 2024-04-14 DIAGNOSIS — R110 Nausea: Secondary | ICD-10-CM | POA: Diagnosis not present

## 2024-04-14 DIAGNOSIS — R682 Dry mouth, unspecified: Secondary | ICD-10-CM

## 2024-04-14 DIAGNOSIS — R600 Localized edema: Secondary | ICD-10-CM

## 2024-04-14 DIAGNOSIS — R42 Dizziness and giddiness: Secondary | ICD-10-CM | POA: Diagnosis not present

## 2024-04-14 DIAGNOSIS — K117 Disturbances of salivary secretion: Secondary | ICD-10-CM | POA: Diagnosis not present

## 2024-04-14 DIAGNOSIS — R52 Pain, unspecified: Secondary | ICD-10-CM | POA: Diagnosis not present

## 2024-04-14 DIAGNOSIS — R221 Localized swelling, mass and lump, neck: Secondary | ICD-10-CM

## 2024-04-14 NOTE — Progress Notes (Signed)
 Dear Dr. Clemmie, Here is my assessment for our mutual patient, Sierra Morris. Thank you for allowing me the opportunity to care for your patient. Please do not hesitate to contact me should you have any other questions. Sincerely, Dr. Hadassah Parody  Otolaryngology Clinic Note Referring provider: Dr. Clemmie HPI:  Discussed the use of AI scribe software for clinical note transcription with the patient, who gave verbal consent to proceed.  History of Present Illness Sierra Morris is a 63 year old female with multiple sclerosis who presents with ear ringing, nausea, and neck swelling.  Bilateral tinnitus and vestibular symptoms - 'Roaring' sound in both ears for six months - Tinnitus worsens with nausea - Dizziness, especially when looking down - Daily nausea with minimal relief from Zofran  - Unintentional weight loss of 14 to 16 pounds over six months  Neck swelling and associated pain - Intermittent neck swelling at left submandibular gland  - Pain radiates to jaw and throat - Application of pressure to swelling produces a fluid taste, does taste bad but is thicker than normal spit  - Topical numbing agents used for pain relief - Swelling sometimes accompanied by sore throat - No recent dental infections  - Had an outside CT 6 months ago that reportedly was negative. Since then she has felt like its gradually gotten a bigger and feels it more deeply   Xerostomia and dysphagia - Dry mouth and thick saliva causing difficulty swallowing - Frequent fluid intake does not alleviate dryness - No recent dental infections or surgeries  Relevant social and surgical history - Occasional tobacco use - History of spine surgery   Medical history includes emphysema, osteoporosis, hyperthyroidism, osteoarthritis, sleep disorder, depression, chronic back pain, Multiple sclerosis, pituitary microadenoma, anorexia,   Independent Review of Additional Tests or Records:  Referral 03/17/24 from  delon Clemmie, MD reviewed - constant bilateral ear ringing, refer to ent and audiology   TSH 03/17/24: 0.655    PMH/Meds/All/SocHx/FamHx/ROS:   Past Medical History:  Diagnosis Date   Anorexia nervosa (HCC)    patient denies   Anxiety    Arthritis    Cataract    Chronic depression    Chronic headaches    Colitis    Constipation due to opioid therapy    Diverticulosis    Emphysema/COPD    patient unaware   Fibromyalgia    GERD (gastroesophageal reflux disease)    History of blood in urine    Hyperthyroidism    borderline per patient   MS (multiple sclerosis)    Osteoarthritis    Osteoporosis    Pituitary microadenoma (HCC)    PONV (postoperative nausea and vomiting)    Seizure (HCC)    Sleep disorder    Tremor    Tubular adenoma of colon      Past Surgical History:  Procedure Laterality Date   ABDOMINAL HYSTERECTOMY  2008   APPENDECTOMY     CESAREAN SECTION     x 2   CHOLECYSTECTOMY     COLONOSCOPY     DENTAL SURGERY     LAPAROSCOPIC BILATERAL SALPINGO OOPHERECTOMY Bilateral 11/30/2014   Procedure: LAPAROSCOPIC BILATERAL SALPINGO OOPHORECTOMY;  Surgeon: Charlie Croak, MD;  Location: [facility];  Service: Gynecology;  Laterality: Bilateral;  converted to open   LAPAROSCOPY     LAPAROTOMY N/A 11/30/2014   Procedure: POSSIBLE EXPLORATORY LAPAROTOMY, ;  Surgeon: Charlie Croak, MD;  Location: [facility];  Service: Gynecology;  Laterality: N/A;   NECK SURGERY     OOPHORECTOMY  SALPINGOOPHORECTOMY Bilateral 11/30/2014   Procedure: BILATERAL SALPINGO OOPHORECTOMY;  Surgeon: Charlie Croak, MD;  Location: [facility];  Service: Gynecology;  Laterality: Bilateral;   TONSILLECTOMY     tumor breast bone removal benign      Family History  Problem Relation Age of Onset   Colon polyps Mother    Hypertension Mother    Depression Father    Diabetes Father    Colon polyps Father    COPD Father    Diabetes Maternal Grandmother    Colon cancer Paternal Grandmother     Rectal cancer Paternal Grandmother    Pancreatitis Son    Irritable bowel syndrome Other        cousin   Esophageal cancer Neg Hx    Stomach cancer Neg Hx    Pancreatic cancer Neg Hx      Social Connections: Not on file     Current Outpatient Medications  Medication Instructions   clonazePAM  (KLONOPIN ) 1 mg, Daily   estrogen-methylTESTOSTERone 0.625-1.25 MG tablet 1 tablet, Every other day   FLUoxetine (PROZAC) 20 mg, Daily   HYDROcodone -acetaminophen  (NORCO/VICODIN) 5-325 MG tablet 1 tablet, Daily   hydrOXYzine (ATARAX) 25 mg, 3 times daily PRN   LINZESS  145 MCG CAPS capsule TAKE 1 CAPSULE BY MOUTH EVERY DAY BEFORE BREAKFAST   methylphenidate  (RITALIN ) 20 mg, 3 times daily   ondansetron  (ZOFRAN ) 8 mg, 3 times daily   sucralfate  (CARAFATE ) 1 g, Oral, 3 times daily before meals   ZEPOSIA 0.92 MG CAPS 1 capsule, Daily     Physical Exam:   BP 120/83 (BP Location: Right Arm, Patient Position: Sitting)   Pulse 99   Temp 98 F (36.7 C)   Ht 5' (1.524 m)   Wt 93 lb (42.2 kg)   SpO2 98%   BMI 18.16 kg/m   Salient findings:  CN II-XII intact Given history and complaints, ear microscopy was indicated and performed for evaluation with findings as below in physical exam section and in procedures  Bilateral EAC clear and TM intact with well pneumatized middle ear spaces No lesions of oral cavity/oropharynx Partial dentures in place No evidence of dental infection  Saliva expressed from left submandibular gland - no discoloration  Left submandibular gland slightly larger than right, mobile.  No obviously palpable neck masses/lymphadenopathy/thyromegaly with the exception of submandibular gland as above No respiratory distress or stridor  Seprately Identifiable Procedures:  Prior to initiating any procedures, risks/benefits/alternatives were explained to the patient and verbal consent obtained.  Procedure (04/14/2024): Bilateral ear microscopy using microscope (CPT  [id number]) Pre-procedure diagnosis: bilateral tinnitus and vestibular symptoms  Post-procedure diagnosis: same Indication: see above; given patient's otologic complaints and history, for improved and comprehensive examination of external ear and tympanic membrane, bilateral otologic examination using microscope was performed. Prior to proceeding, verbal consent was obtained after discussion of R/B/A  Procedure: Patient was placed semi-recumbent. Both ear canals were examined using the microscope with findings above. Patient tolerated the procedure well.   Impression & Plans:  Sierra Morris is a 63 y.o. female with   1. Tinnitus of both ears   2. Mass of left side of neck   3. Chronic nausea   4. Vertigo   5. Submandibular gland swelling     Assessment and Plan Assessment & Plan Suspected Meniere's disease with tinnitus, chronic nausea, dizziness with tinnitus worsening with nausea episodes Symptoms consistent with Meniere's disease. Differential includes other causes of tinnitus and dizziness. - Discussed primary care physician regarding diuretic for inner  ear fluid imbalance. They would recommend avoiding any diuretic use given pt hypotension at baseline - Called pt and discussed need to keep sodium levels < 2 g per day. Unaware of any other mediations for meniere's aside from different diuretics but will continue looking into this to see if alternative available.  - Order hearing test for baseline.  Submandibular gland dysfunction with swelling, pain, and xerostomia Swelling and pain in submandibular gland with xerostomia. Differential includes gland dysfunction, obstruction, or autoimmune etiology, malignancy  Previous CT scan 6 months ago unremarkable per patient  Feels as though the swelling is deeper than it used to be but not more painful.  - Will repeat CT neck given progressing symptoms  - labs for Sjogren's syndrome rule out  - Consult rheumatologist if autoimmune etiology  suspected.    See below regarding exact medications prescribed this encounter including dosages and route: No orders of the defined types were placed in this encounter.   Thank you for allowing me the opportunity to care for your patient. Please do not hesitate to contact me should you have any other questions.  Sincerely, Hadassah Parody, MD Otolaryngologist (ENT), [facility] Phone: [phone] Fax: [phone]

## 2024-04-16 ENCOUNTER — Ambulatory Visit
Admission: RE | Admit: 2024-04-16 | Discharge: 2024-04-16 | Disposition: A | Discharge status: Home / Self Care | Source: Ambulatory Visit | Attending: Family | Admitting: Family

## 2024-04-16 ENCOUNTER — Telehealth (INDEPENDENT_AMBULATORY_CARE_PROVIDER_SITE_OTHER): Payer: Self-pay

## 2024-04-16 DIAGNOSIS — K561 Intussusception: Secondary | ICD-10-CM

## 2024-04-16 MED ORDER — IOPAMIDOL (ISOVUE-300) INJECTION 61%
100.0000 mL | Freq: Once | INTRAVENOUS | Status: AC | PRN
  Administered 2024-04-16: 100 mL via INTRAVENOUS

## 2024-04-16 NOTE — Telephone Encounter (Signed)
 called and spoke with daughter (she was right there as well) about everything daughter understood and said thank you she is going to get the labs done as soon as possible.

## 2024-04-17 ENCOUNTER — Telehealth (HOSPITAL_BASED_OUTPATIENT_CLINIC_OR_DEPARTMENT_OTHER): Payer: Self-pay

## 2024-04-20 ENCOUNTER — Telehealth (INDEPENDENT_AMBULATORY_CARE_PROVIDER_SITE_OTHER): Payer: Self-pay

## 2024-04-20 NOTE — Telephone Encounter (Signed)
 The patient called in reporting that she got a call that we had ordered some labwork and she is unsure where she needs to go to get this labwork done.  Please advise.

## 2024-04-24 ENCOUNTER — Encounter: Payer: Self-pay | Admitting: Internal Medicine

## 2024-04-26 ENCOUNTER — Telehealth (INDEPENDENT_AMBULATORY_CARE_PROVIDER_SITE_OTHER): Payer: Self-pay

## 2024-04-26 NOTE — Telephone Encounter (Signed)
 The patient is asking where to go to get the lab work that was ordered at her last appointment here.  Please advise, she is confused as to where to go to get the labwork done.  I did give her the contact number to get her imaging done.

## 2024-04-26 NOTE — Telephone Encounter (Signed)
 I just printed off the order that she can take with her to go get the labs done and she stated she is going to call today to get the CT scheduled.

## 2024-04-26 NOTE — Telephone Encounter (Signed)
 She just picked up her lab orders and is going to get them done now.

## 2024-04-27 ENCOUNTER — Telehealth: Payer: Self-pay | Admitting: Internal Medicine

## 2024-04-27 ENCOUNTER — Ambulatory Visit (INDEPENDENT_AMBULATORY_CARE_PROVIDER_SITE_OTHER): Admitting: Internal Medicine

## 2024-04-27 ENCOUNTER — Encounter: Payer: Self-pay | Admitting: Internal Medicine

## 2024-04-27 VITALS — BP 110/58 | HR 112 | Ht 60.0 in | Wt 94.5 lb

## 2024-04-27 DIAGNOSIS — R194 Change in bowel habit: Secondary | ICD-10-CM

## 2024-04-27 DIAGNOSIS — R109 Unspecified abdominal pain: Secondary | ICD-10-CM | POA: Diagnosis not present

## 2024-04-27 DIAGNOSIS — R1032 Left lower quadrant pain: Secondary | ICD-10-CM

## 2024-04-27 DIAGNOSIS — R110 Nausea: Secondary | ICD-10-CM | POA: Diagnosis not present

## 2024-04-27 DIAGNOSIS — K582 Mixed irritable bowel syndrome: Secondary | ICD-10-CM

## 2024-04-27 DIAGNOSIS — Z860101 Personal history of adenomatous and serrated colon polyps: Secondary | ICD-10-CM | POA: Diagnosis not present

## 2024-04-27 LAB — SJOGRENS SYNDROME-A EXTRACTABLE NUCLEAR ANTIBODY: SSA (Ro) (ENA) Antibody, IgG: <1 AI

## 2024-04-27 LAB — SJOGRENS SYNDROME-B EXTRACTABLE NUCLEAR ANTIBODY: SSB (La) (ENA) Antibody, IgG: <1 AI

## 2024-04-27 MED ORDER — RIFAXIMIN 550 MG PO TABS
550.0000 mg | ORAL_TABLET | Freq: Three times a day (TID) | ORAL | 0 refills | Status: AC
Start: 2024-04-27 — End: 2024-05-11

## 2024-04-27 NOTE — Telephone Encounter (Signed)
 Inbound call from patient stating that her pharmacy had advised her that she would need to changing her medication for Xifaxan or her insurance is requiring more information for us  in order to approve this medication for the patient. Patient is requesting a call back. Please advise.

## 2024-04-27 NOTE — Patient Instructions (Signed)
 We have sent the following medications to your pharmacy for you to pick up at your convenience: xifaxan.   Continue Linzess  daily and zofran  three times a day.  MyChart message Dr. Albertus with an update of your symptoms after you have finished treatment with xifaxan.  _______________________________________________________  If your blood pressure at your visit was 140/90 or greater, please contact your primary care physician to follow up on this.  _______________________________________________________  If you are age 80 or older, your body mass index should be between 23-30. Your Body mass index is 18.46 kg/m. If this is out of the aforementioned range listed, please consider follow up with your Primary Care Provider.  If you are age 68 or younger, your body mass index should be between 19-25. Your Body mass index is 18.46 kg/m. If this is out of the aformentioned range listed, please consider follow up with your Primary Care Provider.   ________________________________________________________  The [facility] providers would like to encourage you to use MYCHART to communicate with providers for non-urgent requests or questions.  Due to long hold times on the telephone, sending your provider a message by [facility] may be a faster and more efficient way to get a response.  Please allow 48 business hours for a response.  Please remember that this is for non-urgent requests.  _______________________________________________________  [facility] is using a team-based approach to care.  Your team is made up of your doctor and two to three APPS. Our APPS (Nurse Practitioners and Physician Assistants) work with your physician to ensure care continuity for you. They are fully qualified to address your health concerns and develop a treatment plan. They communicate directly with your gastroenterologist to care for you. Seeing the Advanced Practice Practitioners on your physician's team can  help you by facilitating care more promptly, often allowing for earlier appointments, access to diagnostic testing, procedures, and other specialty referrals.

## 2024-04-27 NOTE — Progress Notes (Signed)
 Subjective:    Patient ID: Sierra Morris, female    DOB: 07-25-1960, 63 y.o.   MRN: [id number]  HPI Sierra Morris is a 63 year old female with a history of colonic polyps, diverticulosis with prior diverticulitis, history of predominantly chronic constipation but intermittently diarrhea, GERD, MS, history of fibromyalgia and cervical disc disease who is here for follow-up.  She is here alone today.  She experiences persistent nausea daily, describing it as a 'seasick kind of feeling' that worsens with activities like looking at her phone. Vomiting is rare, but the nausea causes significant discomfort. She uses Zofran  for relief, taking it up to four times a day.  Abdominal pain is primarily on the left mid and lower side, associated with swelling and bloating. The pain worsens with constipation and is alleviated by bowel movements. She reports a hard, fibrous mass in her abdomen which comes and goes.  Bowel movements are irregular, with episodes of diarrhea characterized by bright yellow or green stools. She notes that the yellow color of her stool often occurs when taking Linzess , which she uses to manage constipation. Occasionally, she takes two doses of Linzess  when the swelling is severe.  She has experienced significant weight loss, dropping to 91 pounds at one point, and currently weighs 94 pounds. Despite eating out of necessity rather than hunger, she remains concerned about her weight loss and overall health.  Past medical evaluations include an EGD on December 10, 2023, which showed diffuse moderate gastritis but no H. pylori, and a colonoscopy on February 11, 2022, which revealed diverticulosis and several polyps, some of which were hyperplastic and others tubular adenomas without dysplasia. Random colon biopsies indicated mild nonspecific inflammation consistent with prep-related changes.  She recently had a normal gastric emptying scan after seeing Ellouise Console  Review of Systems As per  HPI, otherwise negative  Current Medications, Allergies, Past Medical History, Past Surgical History, Family History and Social History were reviewed in [employer] record.    Objective:   Physical Exam BP (!) 110/58   Pulse (!) 112   Ht 5' (1.524 m)   Wt 94 lb 8 oz (42.9 kg)   BMI 18.46 kg/m  Gen: awake, alert, NAD HEENT: anicteric  Abd: soft, tender in the left lower quadrant without rebound or guarding, no masses, nondistended, +BS throughout Ext: no c/c/e Neuro: nonfocal  CT ABDOMEN AND PELVIS WITH CONTRAST   TECHNIQUE: Multidetector CT imaging of the abdomen and pelvis was performed using the standard protocol following bolus administration of intravenous contrast.   RADIATION DOSE REDUCTION: This exam was performed according to the departmental dose-optimization program which includes automated exposure control, adjustment of the mA and/or kV according to patient size and/or use of iterative reconstruction technique.   CONTRAST:  75mL ISOVUE -300 IOPAMIDOL  (ISOVUE -300) INJECTION 61%   COMPARISON:  12/04/2023   FINDINGS: Lower Chest: No acute findings.   Hepatobiliary: No suspicious hepatic masses identified. Prior cholecystectomy. No evidence of biliary obstruction.   Pancreas:  No mass or inflammatory changes.   Spleen: Within normal limits in size and appearance.   Adrenals/Urinary Tract: No suspicious masses identified. No evidence of ureteral calculi or hydronephrosis. Unremarkable unopacified urinary bladder.   Stomach/Bowel: Short-segment small bowel intussusception is seen in proximal ileum in the left upper quadrant. No evidence of bowel obstruction, inflammatory process or abnormal fluid collections.   Vascular/Lymphatic: No pathologically enlarged lymph nodes. No acute vascular findings.   Reproductive: Prior hysterectomy noted. Adnexal regions are unremarkable in appearance.  Other:  None.   Musculoskeletal:  No  suspicious bone lesions identified.   IMPRESSION: Short-segment small bowel intussusception in proximal ileum in left upper quadrant. These are typically idiopathic and self-limited. Consider follow-up with CT enterography in 1 month.   No other significant abnormality.     Electronically Signed   By: Norleen DELENA Kil M.D.   On: 04/05/2024 12:20  CT ABDOMEN AND PELVIS WITH CONTRAST (ENTEROGRAPHY) 04/16/2024 10:29:31 AM   TECHNIQUE: CT of the abdomen and pelvis was performed after the administration of 100 mL of iopamidol  (ISOVUE -300) 61% injection and negative oral contrast. Automated exposure control, iterative reconstruction, and/or weight based adjustment of the mA/kV was utilized to reduce the radiation dose to as low as reasonably achievable.   COMPARISON: CT 04/02/19   CLINICAL HISTORY: Intussusception. Follow-up from scan 04/02/19 for intussusception. History of appendectomy, ovarian surgery, and cholecystectomy. No cancer.   FINDINGS:   LOWER CHEST: No acute abnormality.   LIVER: The liver is unremarkable.   GALLBLADDER AND BILE DUCTS: Post cholecystectomy anatomy. No biliary ductal dilatation.   SPLEEN: No acute abnormality.   PANCREAS: No acute abnormality.   ADRENAL GLANDS: No acute abnormality.   KIDNEYS, URETERS AND BLADDER: No stones in the kidneys or ureters. No hydronephrosis. No perinephric or periureteral stranding. Urinary bladder is unremarkable.   STOMACH AND BOWEL: The stomach is distended with fluid presumably the negative oral contrast. The duodenum and jejunum are normal. No evidence of a intussusception. The jejunum and ileum have normal mucosal pattern without dilatation. Terminal ileum is normal. Appendix is not identified. There is fluid throughout the ascending transverse colon presumably related to the enteric contrast. There is stool in the descending colon and rectosigmoid colon. No bowel obstruction.   PERITONEUM AND  RETROPERITONEUM: No ascites. No free air.   VASCULATURE: Aorta is normal in caliber.   LYMPH NODES: No lymphadenopathy.   REPRODUCTIVE ORGANS: Status post ovarian cystectomy.   BONES AND SOFT TISSUES: No acute osseous abnormality. No focal soft tissue abnormality.   IMPRESSION: 1. No evidence of intussusception or bowel obstruction.   Electronically signed by: Norleen Boxer MD 04/16/2024 10:55 AM     Latest Ref Rng & Units 04/07/2024   12:17 PM 11/28/2023    3:30 PM 01/16/2022   10:25 AM  CBC  WBC 4.0 - 10.5 K/uL 4.2  3.4  3.5   Hemoglobin 12.0 - 15.0 g/dL 85.5  85.2  86.3   Hematocrit 36.0 - 46.0 % 44.5  44.1  41.5   Platelets 150 - 400 K/uL 271  255.0  217.0    CMP     Component Value Date/Time   NA 139 04/07/2024 1217   K 4.8 04/07/2024 1217   CL 105 04/07/2024 1217   CO2 24 04/07/2024 1217   GLUCOSE 88 04/07/2024 1217   BUN 12 04/07/2024 1217   CREATININE 0.78 04/07/2024 1217   CALCIUM 9.6 04/07/2024 1217   PROT 7.3 04/07/2024 1217   ALBUMIN 4.6 04/07/2024 1217   AST 31 04/07/2024 1217   ALT 33 04/07/2024 1217   ALKPHOS 92 04/07/2024 1217   BILITOT 0.2 04/07/2024 1217   GFR 87.63 11/28/2023 1530   GFRNONAA >60 04/07/2024 1217   GFRNONAA 97 09/27/2023 0927   GFRNONAA 101 09/27/2023 0927   Iron/TIBC/Ferritin/ %Sat    Component Value Date/Time   IRON 76 04/07/2024 1218   TIBC 417 04/07/2024 1218   FERRITIN 95 04/07/2024 1218   IRONPCTSAT 18 04/07/2024 1218   Lab Results  Component  Value Date   VITAMINB12 511 04/07/2024   Erythrocyte Sedimentation Rate     Component Value Date/Time   ESRSEDRATE 0 04/07/2024 1217   RADIOLOGY CT enterography: No bowel thickening, no mass, no obstruction or stricture  DIAGNOSTIC EGD: Diffuse moderate gastritis, normal esophagus and duodenum (12/10/2023) Colonoscopy: Five sub-centimeter polyps, diverticulosis (02/11/2022) Gastric emptying scan: Normal  PATHOLOGY Gastric biopsy: No significant diagnostic  alteration, no H. pylori (12/10/2023) Colonic polyp biopsy: Three ulcerative polyps and tubular adenoma, negative for dysplasia; two hyperplastic polyps (02/11/2022) Random colon biopsy: Mild nonspecific inflammation with lymphocytes, consistent with PrEP related change, no diagnostic features of IBD or microscopic colitis (02/11/2022)    Assessment & Plan:   Chronic abdominal pain, bloating, and altered bowel habits with suspected small intestinal bacterial overgrowth (SIBO) Symptoms suggest SIBO despite normal CT enterography. Bloating, pain, and altered bowel function - Prescribed rifaximin 550 mg for two weeks, TID. - Continue Linzess  145 mcg daily, can increase to 290 mcg daily as needed. - Limit Zofran  8 mg to TID.  Chronic nausea Persistent nausea with limited relief from Zofran . Potential link to SIBO. - Treat SIBO with rifaximin. - Consider Reglan  if nausea persists post-SIBO treatment. - EGD and GES normal.  History of colonic polyps Last colonoscopy August 2023 - Recall colonoscopy August 2028  30 minutes total spent today including patient facing time, coordination of care, reviewing medical history/procedures/pertinent radiology studies, and documentation of the encounter.

## 2024-04-28 ENCOUNTER — Telehealth: Payer: Self-pay

## 2024-04-28 ENCOUNTER — Other Ambulatory Visit (HOSPITAL_COMMUNITY): Payer: Self-pay

## 2024-04-28 NOTE — Telephone Encounter (Signed)
 Pharmacy Patient Advocate Encounter  Received notification from [facility] Medicare that Prior Authorization for Xifaxan 550MG  tablets has been APPROVED from 04-28-2024 to 05-12-2024. Ran test claim, Copay is $0.00. This test claim was processed through [facility]- copay amounts may vary at other pharmacies due to pharmacy/plan contracts, or as the patient moves through the different stages of their insurance plan.   PA #/Case ID/Reference #: [id number]

## 2024-04-28 NOTE — Telephone Encounter (Signed)
 PA request has been Submitted. New Encounter has been or will be created for follow up. For additional info see Pharmacy Prior Auth telephone encounter from 04-28-2024.

## 2024-04-28 NOTE — Telephone Encounter (Signed)
 Patient is advised of Xifaxan approval through insurance with projected $0.00 copay. She verbalizes understanding.

## 2024-04-28 NOTE — Telephone Encounter (Signed)
 Pharmacy Patient Advocate Encounter   Received notification from Pt Calls Messages that prior authorization for Xifaxan 550MG  tablets is required/requested.   Insurance verification completed.   The patient is insured through [facility].   Per test claim: PA required; PA submitted to above mentioned insurance via Latent Key/confirmation #/EOC [facility] Status is pending

## 2024-05-04 ENCOUNTER — Ambulatory Visit (HOSPITAL_COMMUNITY)
Admission: RE | Admit: 2024-05-04 | Discharge: 2024-05-04 | Disposition: A | Discharge status: Home / Self Care | Source: Ambulatory Visit

## 2024-05-04 DIAGNOSIS — R221 Localized swelling, mass and lump, neck: Secondary | ICD-10-CM | POA: Diagnosis present

## 2024-05-04 MED ORDER — IOHEXOL 300 MG/ML  SOLN
75.0000 mL | Freq: Once | INTRAMUSCULAR | Status: AC | PRN
Start: 2024-05-04 — End: 2024-05-04
  Administered 2024-05-04: 75 mL via INTRAVENOUS

## 2024-05-04 MED ORDER — SODIUM CHLORIDE (PF) 0.9 % IJ SOLN
INTRAMUSCULAR | Status: AC
  Filled 2024-05-04: qty 50

## 2024-05-10 ENCOUNTER — Ambulatory Visit (INDEPENDENT_AMBULATORY_CARE_PROVIDER_SITE_OTHER): Payer: Self-pay

## 2024-05-10 NOTE — Telephone Encounter (Signed)
 Called Pt let her know about her results Pt understood. And we have her scheduled to come in the same day as her Audio test at 230 to see you.

## 2024-05-14 ENCOUNTER — Encounter: Payer: Self-pay | Admitting: Internal Medicine

## 2024-05-18 NOTE — Telephone Encounter (Signed)
 We can try her on metoclopramide  5 mg TID-AC PRN -- as needed only Advise of the potential for neurology side effects (tremor), she can run this by her neurology (she has MS) before starting for reassurance JMP

## 2024-05-21 MED ORDER — METOCLOPRAMIDE HCL 5 MG PO TABS: 5.0000 mg | ORAL_TABLET | Freq: Three times a day (TID) | ORAL | 1 refills | Status: DC

## 2024-05-21 NOTE — Addendum Note (Signed)
 Addended by: CLAUDENE NAOMIE SAILOR on: 05/21/2024 10:15 AM   Modules accepted: Orders

## 2024-05-28 ENCOUNTER — Ambulatory Visit (INDEPENDENT_AMBULATORY_CARE_PROVIDER_SITE_OTHER)

## 2024-05-28 ENCOUNTER — Ambulatory Visit (INDEPENDENT_AMBULATORY_CARE_PROVIDER_SITE_OTHER): Admitting: Audiology

## 2024-06-09 ENCOUNTER — Ambulatory Visit (INDEPENDENT_AMBULATORY_CARE_PROVIDER_SITE_OTHER)

## 2024-06-09 ENCOUNTER — Ambulatory Visit (INDEPENDENT_AMBULATORY_CARE_PROVIDER_SITE_OTHER): Admitting: Audiology

## 2024-06-09 ENCOUNTER — Encounter (INDEPENDENT_AMBULATORY_CARE_PROVIDER_SITE_OTHER): Payer: Self-pay

## 2024-06-09 VITALS — BP 134/87 | HR 98 | Temp 98.1°F

## 2024-06-09 DIAGNOSIS — E349 Endocrine disorder, unspecified: Secondary | ICD-10-CM

## 2024-06-09 DIAGNOSIS — R42 Dizziness and giddiness: Secondary | ICD-10-CM

## 2024-06-09 DIAGNOSIS — R600 Localized edema: Secondary | ICD-10-CM

## 2024-06-09 DIAGNOSIS — H9313 Tinnitus, bilateral: Secondary | ICD-10-CM

## 2024-06-09 DIAGNOSIS — R682 Dry mouth, unspecified: Secondary | ICD-10-CM | POA: Diagnosis not present

## 2024-06-09 DIAGNOSIS — Z0110 Encounter for examination of ears and hearing without abnormal findings: Secondary | ICD-10-CM | POA: Diagnosis not present

## 2024-06-09 DIAGNOSIS — K117 Disturbances of salivary secretion: Secondary | ICD-10-CM

## 2024-06-09 DIAGNOSIS — H8109 Meniere's disease, unspecified ear: Secondary | ICD-10-CM | POA: Diagnosis not present

## 2024-06-09 MED ORDER — MECLIZINE HCL 25 MG PO TABS: 25.0000 mg | ORAL_TABLET | Freq: Three times a day (TID) | ORAL | 0 refills | Status: AC | PRN

## 2024-06-09 MED ORDER — PROMETHAZINE HCL 12.5 MG PO TABS: 12.5000 mg | ORAL_TABLET | Freq: Three times a day (TID) | ORAL | 0 refills | Status: DC | PRN

## 2024-06-09 NOTE — Progress Notes (Unsigned)
 Dear Dr. Clemmie, Here is my assessment for our mutual patient, Sierra Morris. Thank you for allowing me the opportunity to care for your patient. Please do not hesitate to contact me should you have any other questions. Sincerely, Dr. Hadassah Parody  Otolaryngology Clinic Note Referring provider: Dr. Clemmie HPI:  Discussed the use of AI scribe software for clinical note transcription with the patient, who gave verbal consent to proceed.  Sierra Morris is a 63 year old female with multiple sclerosis who presents with ear ringing, nausea, and neck swelling.  Bilateral tinnitus and vestibular symptoms - 'Roaring' sound in both ears for six months - Tinnitus worsens with nausea - Dizziness, especially when looking down - Daily nausea with minimal relief from Zofran  - Unintentional weight loss of 14 to 16 pounds over six months  Neck swelling and associated pain - Intermittent neck swelling at left submandibular gland  - Pain radiates to jaw and throat - Application of pressure to swelling produces a fluid taste, does taste bad but is thicker than normal spit  - Topical numbing agents used for pain relief - Swelling sometimes accompanied by sore throat - No recent dental infections  - Had an outside CT 6 months ago that reportedly was negative. Since then she has felt like its gradually gotten a bigger and feels it more deeply   Xerostomia and dysphagia - Dry mouth and thick saliva causing difficulty swallowing - Frequent fluid intake does not alleviate dryness - No recent dental infections or surgeries  Relevant social and surgical history - Occasional tobacco use - History of spine surgery  History of Present Illness --------------------------------------------------------- 06/09/2024    Patient presents for follow-up. Regarding her vestibular symptoms, she is not doing well.  Persistent daily nausea lasting half to three quarters of the day.  Nausea worsens with downward  gaze or looking at her phone.  Requires daily Zofran .  Used Phenergan  in the past but it caused significant sedation so she is avoiding it for this reason  Having constant tinnitus described as roaring - Tinnitus increases in intensity when nausea worsens - Impaired hearing during episodes, requiring her to speak louder - No vertigo, but intermittent 'wobbly' sensation  Continues to have frequent dry mouth and swelling in her left submandibular region.   Medical history includes emphysema, osteoporosis, hyperthyroidism, osteoarthritis, sleep disorder, depression, chronic back pain, Multiple sclerosis, pituitary microadenoma, anorexia,   Independent Review of Additional Tests or Records:  Referral 03/17/24 from delon Clemmie, MD reviewed - constant bilateral ear ringing, refer to ent and audiology   TSH 03/17/24: 0.655   CT neck 05/10/2024 independently reviewed and shows no acute abnormality in the left neck area of concern.  Sjorgren syndrome B 04/26/24: negative Sjogren Syndrome A 04/26/24: Negative  06/09/24 Audiogram was independently reviewed and interpreted by me and it reveals Right ear: normal hearing thresholds with slight dip at 4000 to the mild hearing loss; 100% word interpretation at 15 dB; type a tympanogram Left ear: Normal hearing thresholds with slight dip at 4000 to mild hearing loss; 100% word interpretation at 10 dB; type a tympanogram    PMH/Meds/All/SocHx/FamHx/ROS:   Past Medical History:  Diagnosis Date   Anorexia nervosa (HCC)    patient denies   Anxiety    Arthritis    Cataract    Chronic depression    Chronic headaches    Colitis    Constipation due to opioid therapy    Diverticulosis    Emphysema/COPD    patient  unaware   Fibromyalgia    GERD (gastroesophageal reflux disease)    History of blood in urine    Hyperthyroidism    borderline per patient   MS (multiple sclerosis)    Osteoarthritis    Osteoporosis    Pituitary microadenoma  (HCC)    PONV (postoperative nausea and vomiting)    Seizure (HCC)    Sleep disorder    Tremor    Tubular adenoma of colon      Past Surgical History:  Procedure Laterality Date   ABDOMINAL HYSTERECTOMY  2008   APPENDECTOMY     CESAREAN SECTION     x 2   CHOLECYSTECTOMY     COLONOSCOPY     DENTAL SURGERY     LAPAROSCOPIC BILATERAL SALPINGO OOPHERECTOMY Bilateral 11/30/2014   Procedure: LAPAROSCOPIC BILATERAL SALPINGO OOPHORECTOMY;  Surgeon: Charlie Croak, MD;  Location: [facility];  Service: Gynecology;  Laterality: Bilateral;  converted to open   LAPAROSCOPY     LAPAROTOMY N/A 11/30/2014   Procedure: POSSIBLE EXPLORATORY LAPAROTOMY, ;  Surgeon: Charlie Croak, MD;  Location: [facility];  Service: Gynecology;  Laterality: N/A;   NECK SURGERY     OOPHORECTOMY     SALPINGOOPHORECTOMY Bilateral 11/30/2014   Procedure: BILATERAL SALPINGO OOPHORECTOMY;  Surgeon: Charlie Croak, MD;  Location: [facility];  Service: Gynecology;  Laterality: Bilateral;   TONSILLECTOMY     tumor breast bone removal benign      Family History  Problem Relation Age of Onset   Colon polyps Mother    Hypertension Mother    Depression Father    Diabetes Father    Colon polyps Father    COPD Father    Diabetes Maternal Grandmother    Colon cancer Paternal Grandmother    Rectal cancer Paternal Grandmother    Pancreatitis Son    Irritable bowel syndrome Other        cousin   Esophageal cancer Neg Hx    Stomach cancer Neg Hx    Pancreatic cancer Neg Hx      Social Connections: Not on file     Current Outpatient Medications  Medication Instructions   clonazePAM  (KLONOPIN ) 1 mg, Daily   estrogen-methylTESTOSTERone 0.625-1.25 MG tablet 1 tablet, Every other day   FLUoxetine (PROZAC) 20 mg, Daily   HYDROcodone -acetaminophen  (NORCO/VICODIN) 5-325 MG tablet 1 tablet, Daily   hydrOXYzine (ATARAX) 25 mg, 3 times daily PRN   LINZESS  145 MCG CAPS capsule TAKE 1 CAPSULE BY MOUTH EVERY DAY BEFORE BREAKFAST    meclizine  (ANTIVERT ) 25 mg, Oral, 3 times daily PRN   methylphenidate  (RITALIN ) 20 mg, 3 times daily   metoCLOPramide  (REGLAN ) 5 mg, Oral, 3 times daily before meals, As needed   ondansetron  (ZOFRAN ) 8 mg, 3 times daily   promethazine  (PHENERGAN ) 12.5 mg, Oral, Every 8 hours PRN   ZEPOSIA 0.92 MG CAPS 1 capsule, Daily     Physical Exam:   BP 134/87 (BP Location: Right Arm, Patient Position: Sitting)   Pulse 98   Temp 98.1 F (36.7 C)   SpO2 97%   Salient findings:  CN II-XII intact Given history and complaints, ear microscopy was indicated and performed for evaluation with findings as below in physical exam section and in procedures  Bilateral EAC clear and TM intact with well pneumatized middle ear spaces No lesions of oral cavity/oropharynx Partial dentures in place No evidence of dental infection  Saliva expressed from left submandibular gland - no discoloration  Left submandibular gland slightly larger than right, mobile.  No obviously palpable neck masses/lymphadenopathy/thyromegaly with the exception of submandibular gland as above No respiratory distress or stridor  Seprately Identifiable Procedures:  Prior to initiating any procedures, risks/benefits/alternatives were explained to the patient and verbal consent obtained.  Procedure (06/09/2024): Bilateral ear microscopy using microscope (CPT [id number]) Pre-procedure diagnosis: bilateral tinnitus and vestibular symptoms  Post-procedure diagnosis: same Indication: see above; given patient's otologic complaints and history, for improved and comprehensive examination of external ear and tympanic membrane, bilateral otologic examination using microscope was performed. Prior to proceeding, verbal consent was obtained after discussion of R/B/A  Procedure: Patient was placed semi-recumbent. Both ear canals were examined using the microscope with findings above. Patient tolerated the procedure well.   Impression & Plans:  Sierra Morris is a 63 y.o. female with   1. Meniere's disease, unspecified laterality   2. Dry mouth   3. Submandibular gland swelling   4. Tinnitus of both ears     Assessment and Plan Assessment & Plan Suspected Meniere's disease with tinnitus, chronic nausea, dizziness with tinnitus worsening with nausea episodes Symptoms consistent with Meniere's disease. Differential includes other causes of tinnitus and dizziness. - Discussed primary care physician regarding diuretic for inner ear fluid imbalance. They would recommend avoiding any diuretic use given pt hypotension at baseline -Keep sodium levels under 2 g/day -Discussed risks and medications meclizine  and Phenergan  for refractory nausea -Audiogram on 06/09/2024 shows normal hearing thresholds -She will discuss again with her PCP if there is any way we can trial 1 of these medications (would try for spironolactone 25 mg) as she does have symptoms very consistent with Mnire's -Follow-up in a few weeks to assess symptoms and consider steroid injection if medication is not started   Submandibular gland dysfunction with swelling, pain, and xerostomia Swelling and pain in submandibular gland with xerostomia. Differential includes gland dysfunction, obstruction, or autoimmune etiology, malignancy  Previous CT scan 6 months ago unremarkable per patient  Feels as though the swelling is deeper than it used to be but not more painful.  - Repeat CT scan on 05/10/2024 was normal -Sjogren's labs negative -Discussed starting Salagen but wants to wait on this given the Mnire's is the bigger problem at this time    See below regarding exact medications prescribed this encounter including dosages and route: Meds ordered this encounter  Medications   promethazine  (PHENERGAN ) 12.5 MG tablet    Sig: Take 1 tablet (12.5 mg total) by mouth every 8 (eight) hours as needed for nausea or vomiting.    Dispense:  20 tablet    Refill:  0   meclizine   (ANTIVERT ) 25 MG tablet    Sig: Take 1 tablet (25 mg total) by mouth 3 (three) times daily as needed for dizziness.    Dispense:  30 tablet    Refill:  0    Thank you for allowing me the opportunity to care for your patient. Please do not hesitate to contact me should you have any other questions.  Sincerely, Hadassah Parody, MD Otolaryngologist (ENT), [facility] Phone: [phone] Fax: [phone]  MDM:  Level 4 Complexity/Problems addressed: 4 Data complexity: 4 independent review of CT, 2 labs, audiogram - Morbidity: 4  - Prescription Drug prescribed or managed: yes

## 2024-06-09 NOTE — Progress Notes (Signed)
  [address], [address] [place], [place] [address] [phone]  Audiological Evaluation    Name: Sierra Morris     DOB:   [birth year]      MRN:   [id number]                                                                                     Service Date: 06/09/2024     Accompanied by: self   Patient comes today after Dr. Greggory, ENT sent a referral for a hearing evaluation due to concerns with tinnitus and dizziness.   Symptoms Yes Details  Hearing loss  []    Tinnitus  [x]  Constant roaring ( form her head)  which worsens when her nausea worsens- this has been going on for 6 months  Ear pain/ infections/pressure  [x]  Sometimes ears pop and she can hear better  Balance problems  [x]  Feels dizzy when looking at her phone.  Noise exposure history  []    Previous ear surgeries  []    Family history of hearing loss  []    Amplification  []    Other  [x]  Multiple Sclerosis    Otoscopy: Right ear: Clear external ear canal and notable landmarks visualized on the tympanic membrane. Left ear:  Clear external ear canal and notable landmarks visualized on the tympanic membrane.  Tympanometry: Right ear: Type A - Normal external ear canal volume with normal middle ear pressure and normal tympanic membrane compliance. Findings are consistent with normal middle ear function. Left ear: Type A - Normal external ear canal volume with normal middle ear pressure and normal tympanic membrane compliance. Findings are consistent with normal middle ear function.   Hearing Evaluation The hearing test results were completed under headphones and results are deemed to be of good reliability. Test technique:  conventional    Pure tone Audiometry: Right ear- Normal hearing from [phone].  Left ear-  Normal hearing from [phone].   Speech Audiometry: Right ear- Speech Reception Threshold (SRT) was obtained at 15 dBHL. Left ear-Speech Reception Threshold (SRT) was obtained at 10 dBHL.   Word  Recognition Score Tested using NU-6 (recorded) Right ear: 100% was obtained at a presentation level of 50 dBHL with contralateral masking which is deemed as  excellent. Left ear: 100% was obtained at a presentation level of 50 dBHL with contralateral masking which is deemed as  excellent.   Impression: There is not a significant difference in pure-tone thresholds between ears. There is not a significant difference in the word recognition score in between ears.    Recommendations: Follow up with ENT as scheduled. Return for a hearing evaluation if concerns with hearing changes arise or per MD recommendation.   Teniola Tseng MARIE LEROUX-MARTINEZ, AUD

## 2024-06-09 NOTE — Patient Instructions (Signed)
 I would like to try medication to treat meniere's.  Would be either spironolactone 25 mg or hydrochlorothiazide. If either are OK with you please let pt know and she can call us .

## 2024-06-11 MED ORDER — PROMETHAZINE HCL 12.5 MG PO TABS: 12.5000 mg | ORAL_TABLET | Freq: Four times a day (QID) | ORAL | 0 refills | Status: AC | PRN

## 2024-06-11 NOTE — Addendum Note (Signed)
 Addended by: CLAUDENE NAOMIE SAILOR on: 06/11/2024 12:43 PM   Modules accepted: Orders

## 2024-06-11 NOTE — Telephone Encounter (Signed)
 Lets first have her try low-dose promethazine  12.5 mg every 6 hours as needed nausea refractory to ondansetron 

## 2024-06-21 ENCOUNTER — Telehealth (INDEPENDENT_AMBULATORY_CARE_PROVIDER_SITE_OTHER): Payer: Self-pay

## 2024-06-21 NOTE — Telephone Encounter (Signed)
 Patient called into office and left a message that could not be fully understood due to call breaking up. I called her back to get clarity but had to left a message for her to call office back so we can get clarity of what she needed.

## 2024-06-28 ENCOUNTER — Ambulatory Visit: Admitting: Physician Assistant

## 2024-06-28 ENCOUNTER — Encounter: Payer: Self-pay | Admitting: Physician Assistant

## 2024-06-28 VITALS — HR 60 | Ht 60.75 in | Wt 96.1 lb

## 2024-06-28 DIAGNOSIS — T402X5A Adverse effect of other opioids, initial encounter: Secondary | ICD-10-CM

## 2024-06-28 DIAGNOSIS — R110 Nausea: Secondary | ICD-10-CM

## 2024-06-28 DIAGNOSIS — K581 Irritable bowel syndrome with constipation: Secondary | ICD-10-CM | POA: Diagnosis not present

## 2024-06-28 DIAGNOSIS — K2950 Unspecified chronic gastritis without bleeding: Secondary | ICD-10-CM

## 2024-06-28 DIAGNOSIS — K5903 Drug induced constipation: Secondary | ICD-10-CM

## 2024-06-28 MED ORDER — LINACLOTIDE 290 MCG PO CAPS: 290.0000 ug | ORAL_CAPSULE | Freq: Every day | ORAL | 5 refills | Status: AC

## 2024-06-28 MED ORDER — METOCLOPRAMIDE HCL 5 MG PO TABS: 5.0000 mg | ORAL_TABLET | Freq: Three times a day (TID) | ORAL | 3 refills | Status: AC

## 2024-06-28 NOTE — Progress Notes (Signed)
 "     Sierra Console, PA-C [address] [place], [place]  [address] Phone: [phone]   Primary Care Physician: Clemmie Nest, MD  Primary Gastroenterologist:  Sierra Console, PA-C / Dr. Gordy Starch   Chief Complaint: Follow-up chronic nausea, abdominal bloating, constipation   HPI:   Discussed the use of AI scribe software for clinical note transcription with the patient, who gave verbal consent to proceed.  Patient last saw Dr. Starch 04/27/2024 for follow-up of chronic nausea, abdominal pain, abdominal bloating, suspected SIBO.  Was treated with Xifaxan  550 mg 3 times daily x 2 weeks.  Continued on Linzess  145/290.  States Zofran  8 mg max 3 times daily.  Consider Reglan  if nausea persists.  Previous EGD and gastric emptying study tests were normal.  Has history of colon polyps.  Last colonoscopy August 2023.  5-year repeat colonoscopy due August 2028.  PMH: history of colonic polyps, diverticulosis with prior diverticulitis, history of predominantly chronic constipation but intermittently diarrhea, GERD, MS, history of fibromyalgia and cervical disc disease.  History of Present Illness Sierra Morris is a 63 year old female with chronic nausea, chronic gastritis, and opioid-induced constipation who presents for follow-up of persistent gastrointestinal symptoms.  Chronic nausea - Persistent daily nausea described as cerebral nauseous with fluctuating severity. - Nausea is exacerbated by talking, social interaction, and looking at her phone. - Nausea never fully resolves and often prevents eating during the day; sometimes able to eat at night and return to bed without vomiting. - Zofran  provides occasional mild relief. - Phenergan  causes severe leg cramps and is avoided. - Previous trials of Xifaxan  and Carafate  were ineffective. - Uncertain if metoclopramide  has been used; prescription sent in November 2025. - Symptoms have led to social withdrawal.  Chronic gastritis -  Upper endoscopy in June 2025 showed gastritis. - Currently on medication for gastritis. - Previous abdominal/pelvic CT in May 2025 was normal.  Opioid-induced constipation - Chronic constipation has worsened over the past week, possibly related to a new medication. - Linzess  145 is taken daily in the morning, with occasional increased dosing  to 290 due to symptoms. - Symptom improvement noted with successful bowel movements. - Intermittent big knots in the abdomen, previously evaluated for intussusception. - Gripy abdominal sensation associated with constipation.  Lower gastrointestinal findings - Last colonoscopy in 2023 showed a few small polyps.   12/04/2023 CT abdomen pelvis with contrast: No acute abnormality.  No bowel inflammation or obstruction.   12/10/23 EGD by Dr. Starch: Moderate gastritis.  Normal esophagus and duodenum.  Biopsies negative for H. pylori and celiac.  No intestinal metaplasia or dysplasia.   02/2022 last colonoscopy: 3 small sessile serrated and tubular adenoma polyps removed.  2 small hyperplastic polyps removed.  Excellent prep.  Mild diverticulosis.  Biopsies negative for microscopic colitis and IBD.  5-year repeat colonoscopy (due 02/2027).   Current Outpatient Medications  Medication Sig Dispense Refill   clonazePAM  (KLONOPIN ) 0.5 MG tablet Take 1 mg by mouth daily.     estrogen-methylTESTOSTERone 0.625-1.25 MG tablet Take 1 tablet by mouth every other day.     FLUoxetine (PROZAC) 20 MG capsule Take 20 mg by mouth daily. (Patient taking differently: Take 40 mg by mouth daily.)     HYDROcodone -acetaminophen  (NORCO/VICODIN) 5-325 MG tablet Take 1 tablet by mouth daily.     hydrOXYzine (ATARAX/VISTARIL) 25 MG tablet Take 25 mg by mouth 3 (three) times daily as needed.     linaclotide  (LINZESS ) 290 MCG CAPS capsule  Take 1 capsule (290 mcg total) by mouth daily before breakfast. 30 capsule 5   meclizine  (ANTIVERT ) 25 MG tablet Take 1 tablet (25 mg total) by  mouth 3 (three) times daily as needed for dizziness. 30 tablet 0   methylphenidate  (RITALIN ) 20 MG tablet Take 20 mg by mouth 3 (three) times daily.  0   ondansetron  (ZOFRAN ) 8 MG tablet Take 8 mg by mouth 3 (three) times daily.     promethazine  (PHENERGAN ) 12.5 MG tablet Take 1 tablet (12.5 mg total) by mouth every 6 (six) hours as needed for nausea or vomiting. 40 tablet 0   spironolactone (ALDACTONE) 25 MG tablet Take 25 mg by mouth daily.     ZEPOSIA 0.92 MG CAPS Take 1 capsule by mouth daily.     metoCLOPramide  (REGLAN ) 5 MG tablet Take 1 tablet (5 mg total) by mouth 3 (three) times daily before meals. As needed 90 tablet 3   No current facility-administered medications for this visit.    Allergies as of 06/28/2024 - Review Complete 06/28/2024  Allergen Reaction Noted   Aubagio [teriflunomide] Other (See Comments) 10/14/2016   Wheat Shortness Of Breath 11/28/2014   Carbidopa Other (See Comments) 10/22/2023   Prednisone Other (See Comments) 02/26/2017   Benadryl  [diphenhydramine ] Itching 01/01/2016   Buprenorphine hcl-naloxone  hcl Other (See Comments) 02/11/2022   Paroxetine Other (See Comments) 11/12/2023   Paroxetine hcl Other (See Comments) 06/24/2011   Paroxetine hcl Other (See Comments) 02/11/2022   Polyethylene glycol (macrogol) Rash and Itching 01/01/2016   Pregabalin Itching 06/24/2011   Suboxone [buprenorphine hcl-naloxone  hcl] Other (See Comments) 07/27/2014    Past Medical History:  Diagnosis Date   Anorexia nervosa (HCC)    patient denies   Anxiety    Arthritis    Cataract    Chronic depression    Chronic headaches    Colitis    Constipation due to opioid therapy    Diverticulosis    Emphysema/COPD    patient unaware   Fibromyalgia    GERD (gastroesophageal reflux disease)    History of blood in urine    Hyperthyroidism    borderline per patient   MS (multiple sclerosis)    Osteoarthritis    Osteoporosis    Pituitary microadenoma (HCC)    PONV  (postoperative nausea and vomiting)    Seizure (HCC)    Sleep disorder    Tremor    Tubular adenoma of colon     Past Surgical History:  Procedure Laterality Date   ABDOMINAL HYSTERECTOMY  2008   APPENDECTOMY     CESAREAN SECTION     x 2   CHOLECYSTECTOMY     COLONOSCOPY     DENTAL SURGERY     LAPAROSCOPIC BILATERAL SALPINGO OOPHERECTOMY Bilateral 11/30/2014   Procedure: LAPAROSCOPIC BILATERAL SALPINGO OOPHORECTOMY;  Surgeon: Charlie Croak, MD;  Location: [facility];  Service: Gynecology;  Laterality: Bilateral;  converted to open   LAPAROSCOPY     LAPAROTOMY N/A 11/30/2014   Procedure: POSSIBLE EXPLORATORY LAPAROTOMY, ;  Surgeon: Charlie Croak, MD;  Location: [facility];  Service: Gynecology;  Laterality: N/A;   NECK SURGERY     OOPHORECTOMY     SALPINGOOPHORECTOMY Bilateral 11/30/2014   Procedure: BILATERAL SALPINGO OOPHORECTOMY;  Surgeon: Charlie Croak, MD;  Location: [facility];  Service: Gynecology;  Laterality: Bilateral;   TONSILLECTOMY     tumor breast bone removal benign      Review of Systems:    All systems reviewed and negative except where noted in  HPI.    Physical Exam:  Pulse 60   Ht 5' 0.75 (1.543 m)   Wt 96 lb 2 oz (43.6 kg)   BMI 18.31 kg/m  No LMP recorded. Patient has had a hysterectomy.  General: Well-nourished, well-developed in no acute distress.  Neuro: Alert and oriented x 3.  Grossly intact.  Psych: Alert and cooperative, normal mood and affect.   Imaging Studies: No results found.  Labs: CBC    Component Value Date/Time   WBC 4.2 04/07/2024 1217   WBC 3.4 (L) 11/28/2023 1530   RBC 4.96 04/07/2024 1217   HGB 14.4 04/07/2024 1217   HCT 44.5 04/07/2024 1217   PLT 271 04/07/2024 1217   MCV 89.7 04/07/2024 1217   MCH 29.0 04/07/2024 1217   MCHC 32.4 04/07/2024 1217   RDW 15.3 04/07/2024 1217   LYMPHSABS 0.6 (L) 04/07/2024 1217   MONOABS 0.5 04/07/2024 1217   EOSABS 0.1 04/07/2024 1217   BASOSABS 0.0 04/07/2024 1217    CMP      Component Value Date/Time   NA 139 04/07/2024 1217   K 4.8 04/07/2024 1217   CL 105 04/07/2024 1217   CO2 24 04/07/2024 1217   GLUCOSE 88 04/07/2024 1217   BUN 12 04/07/2024 1217   CREATININE 0.78 04/07/2024 1217   CALCIUM 9.6 04/07/2024 1217   PROT 7.3 04/07/2024 1217   ALBUMIN 4.6 04/07/2024 1217   AST 31 04/07/2024 1217   ALT 33 04/07/2024 1217   ALKPHOS 92 04/07/2024 1217   BILITOT 0.2 04/07/2024 1217   GFRNONAA >60 04/07/2024 1217   GFRNONAA 97 09/27/2023 0927   GFRNONAA 101 09/27/2023 0927   GFRAA >60 05/25/2015 1900     Assessment and Plan:   Sierra Morris is a 63 y.o. y/o female returns for follow-up of chronic GI symptoms: Assessment & Plan Chronic nausea Persistent nausea with partial ondansetron  response, promethazine  intolerance, and unrevealing prior evaluations except gastritis. Slow gastric emptying considered, even though GES was normal.  Extensive negative GI workup.  Possibly adverse side effects of chronic medication versus due to IBS with constipation. - Prescribed metoclopramide  5 mg TID before meals. - Instructed to trial morning or midday dosing for oral intake benefit. - Documented metoclopramide  in visit summary. - Instructed patient to limit and avoid opioid medications (Norco) which can cause nausea and constipation.  Chronic gastritis (H. pylori negative) Chronic gastritis confirmed by endoscopy, H. pylori negative, managed with current regimen. Symptoms include nausea and poor intake. - Continued current gastritis medication regimen.  Opioid-induced constipation / IBS-C Constipation likely due to opioid use and diuretics, with abdominal fullness and palpable stool masses. Symptoms improve post-bowel movement. - Increased Linzess  to 290 mcg daily, new prescription provided. - Instructed regular morning use before meals, with flexibility. - Emphasized consistent Linzess  use for optimal management.  Sierra Console, PA-C  Follow up in 6 months  with Dr. Albertus.   "

## 2024-06-28 NOTE — Patient Instructions (Addendum)
 We have sent the following medications to your pharmacy for you to pick up at your convenience: Linzess  290 mcg once daily before breakfast and Reglan  5 mg one tablet 3 times daily before meals for nausea  Please follow up sooner if symptoms increase or worsen  Due to recent changes in healthcare laws, you may see the results of your imaging and laboratory studies on MyChart before your provider has had a chance to review them.  We understand that in some cases there may be results that are confusing or concerning to you. Not all laboratory results come back in the same time frame and the provider may be waiting for multiple results in order to interpret others.  Please give us  48 hours in order for your provider to thoroughly review all the results before contacting the office for clarification of your results.   Thank you for trusting me with your gastrointestinal care!   Ellouise Console, PA-C _______________________________________________________  If your blood pressure at your visit was 140/90 or greater, please contact your primary care physician to follow up on this.  _______________________________________________________  If you are age 63 or older, your body mass index should be between 23-30. Your Body mass index is 18.31 kg/m. If this is out of the aforementioned range listed, please consider follow up with your Primary Care Provider.  If you are age 63 or younger, your body mass index should be between 19-25. Your Body mass index is 18.31 kg/m. If this is out of the aformentioned range listed, please consider follow up with your Primary Care Provider.   ________________________________________________________  The [facility] providers would like to encourage you to use MYCHART to communicate with providers for non-urgent requests or questions.  Due to long hold times on the telephone, sending your provider a message by [facility] may be a faster and more efficient way to get a response.   Please allow 48 business hours for a response.  Please remember that this is for non-urgent requests.  _______________________________________________________

## 2024-07-05 ENCOUNTER — Ambulatory Visit (INDEPENDENT_AMBULATORY_CARE_PROVIDER_SITE_OTHER)

## 2024-07-09 ENCOUNTER — Telehealth: Payer: Self-pay | Admitting: Hematology and Oncology

## 2024-07-09 ENCOUNTER — Inpatient Hospital Stay: Attending: Hematology and Oncology

## 2024-07-09 ENCOUNTER — Other Ambulatory Visit: Payer: Self-pay | Admitting: Hematology and Oncology

## 2024-07-09 ENCOUNTER — Inpatient Hospital Stay (HOSPITAL_BASED_OUTPATIENT_CLINIC_OR_DEPARTMENT_OTHER): Admitting: Hematology and Oncology

## 2024-07-09 ENCOUNTER — Other Ambulatory Visit: Payer: Self-pay

## 2024-07-09 VITALS — BP 123/88 | HR 81 | Temp 98.8°F | Resp 16 | Ht 60.75 in | Wt 97.4 lb

## 2024-07-09 DIAGNOSIS — D72810 Lymphocytopenia: Secondary | ICD-10-CM

## 2024-07-09 LAB — CBC WITH DIFFERENTIAL (CANCER CENTER ONLY)
Abs Immature Granulocytes: 0.02 K/uL (ref 0.00–0.07)
Basophils Absolute: 0 K/uL (ref 0.0–0.1)
Basophils Relative: 0 %
Eosinophils Absolute: 0 K/uL (ref 0.0–0.5)
Eosinophils Relative: 0 %
HCT: 45.5 % (ref 36.0–46.0)
Hemoglobin: 15.1 g/dL — ABNORMAL HIGH (ref 12.0–15.0)
Immature Granulocytes: 0 %
Lymphocytes Relative: 8 %
Lymphs Abs: 0.4 K/uL — ABNORMAL LOW (ref 0.7–4.0)
MCH: 29.8 pg (ref 26.0–34.0)
MCHC: 33.2 g/dL (ref 30.0–36.0)
MCV: 89.7 fL (ref 80.0–100.0)
Monocytes Absolute: 0.4 K/uL (ref 0.1–1.0)
Monocytes Relative: 10 %
Neutro Abs: 3.7 K/uL (ref 1.7–7.7)
Neutrophils Relative %: 82 %
Platelet Count: 290 K/uL (ref 150–400)
RBC: 5.07 MIL/uL (ref 3.87–5.11)
RDW: 15 % (ref 11.5–15.5)
WBC Count: 4.5 K/uL (ref 4.0–10.5)
nRBC: 0 % (ref 0.0–0.2)

## 2024-07-09 NOTE — Telephone Encounter (Signed)
 Patient has been scheduled for follow-up visit per 07/09/2024 LOS.  Pt given an appt calendar with date and time.

## 2024-07-09 NOTE — Progress Notes (Signed)
 " [facility] [address] [place],  [place]  [address] [phone]  Clinic Day:  07/20/2024   Referring physician: Clemmie Nest, MD  Patient Care Team: Patient Care Team: Clemmie Nest, MD as PCP - General (Family Medicine)   REASON FOR CONSULTATION:  Decreased lymphocytes  HISTORY OF PRESENT ILLNESS:   Sierra Morris is a 64 y.o. female with a history of decrease lymphocytes who is referred in consultation by Nest Clemmie, MD for assessment and management. She denies having been told before that she has any abnormality of her CBC. She denies fever, chills, nausea or vomiting. She denies shortness of breath, chest pain or cough.  She is a light tobacco smoker with rare alcohol use. She is disabled with 3 children.   Medical history includes emphysema, osteoporosis, hyperthyroidism, osteoarthritis, sleep disorder, depression, chronic back pain, Multiple sclerosis, pituitary microadenoma, anorexia,   Surgical history includes hysterectomy, tonsillectomy, dental surgery, oophorectomy, cholecystectomy, C-section x 2, adenoidectomy, appendectomy  Family history includes diabetes mellitus, depression, colon cancer, hypertension, colon polyp, COPD, Parkinson, Alzheimer disease   REVIEW OF SYSTEMS:   Review of Systems  Constitutional: Negative.   HENT:  Negative.    Eyes: Negative.   Respiratory: Negative.    Cardiovascular: Negative.   Gastrointestinal: Negative.   Endocrine: Negative.   Genitourinary: Negative.    Musculoskeletal: Negative.   Skin: Negative.   Neurological: Negative.   Hematological: Negative.   Psychiatric/Behavioral: Negative.       VITALS:   Blood pressure 123/88, pulse 81, temperature 98.8 F (37.1 C), temperature source Oral, resp. rate 16, height 5' 0.75 (1.543 m), weight 97 lb 6.4 oz (44.2 kg), SpO2 100%.  Wt Readings from Last 3 Encounters:  07/09/24 97 lb 6.4 oz (44.2 kg)  06/28/24 96 lb 2 oz (43.6 kg)   04/27/24 94 lb 8 oz (42.9 kg)    Body mass index is 18.56 kg/m.  Performance status (ECOG): 0 - Asymptomatic  PHYSICAL EXAM:   Physical Exam Constitutional:      Appearance: Normal appearance. She is normal weight.  HENT:     Head: Normocephalic and atraumatic.     Mouth/Throat:     Mouth: Mucous membranes are moist.  Cardiovascular:     Rate and Rhythm: Normal rate and regular rhythm.     Pulses: Normal pulses.     Heart sounds: Normal heart sounds.  Pulmonary:     Effort: Pulmonary effort is normal.     Breath sounds: Normal breath sounds.  Abdominal:     General: Bowel sounds are normal.  Musculoskeletal:        General: Normal range of motion.  Skin:    General: Skin is warm and dry.  Neurological:     General: No focal deficit present.     Mental Status: She is alert and oriented to person, place, and time. Mental status is at baseline.  Psychiatric:        Mood and Affect: Mood normal.        Behavior: Behavior normal.        Thought Content: Thought content normal.        Judgment: Judgment normal.      LABS:      Latest Ref Rng & Units 07/09/2024   11:11 AM 04/07/2024   12:17 PM 11/28/2023    3:30 PM  CBC  WBC 4.0 - 10.5 K/uL 4.5  4.2  3.4   Hemoglobin 12.0 - 15.0 g/dL 84.8  85.5  14.7   Hematocrit 36.0 - 46.0 % 45.5  44.5  44.1   Platelets 150 - 400 K/uL 290  271  255.0       Latest Ref Rng & Units 04/07/2024   12:17 PM 11/28/2023    3:30 PM 01/16/2022   10:25 AM  CMP  Glucose 70 - 99 mg/dL 88  82  89   BUN 8 - 23 mg/dL 12  10  13    Creatinine 0.44 - 1.00 mg/dL 9.21  9.26  9.18   Sodium 135 - 145 mmol/L 139  141  137   Potassium 3.5 - 5.1 mmol/L 4.8  4.3  4.0   Chloride 98 - 111 mmol/L 105  104  109   CO2 22 - 32 mmol/L 24  30  23    Calcium 8.9 - 10.3 mg/dL 9.6  9.5  8.8   Total Protein 6.5 - 8.1 g/dL 7.3  7.0  6.5   Total Bilirubin 0.0 - 1.2 mg/dL 0.2  0.4  0.3   Alkaline Phos 38 - 126 U/L 92  54  64   AST 15 - 41 U/L 31  19  21    ALT 0 -  44 U/L 33  27  29      No results found for: CEA1, CEA / No results found for: CEA1, CEA No results found for: PSA1 No results found for: CAN199 No results found for: CAN125  No results found for: TOTALPROTELP, ALBUMINELP, A1GS, A2GS, BETS, BETA2SER, GAMS, MSPIKE, SPEI Lab Results  Component Value Date   TIBC 417 04/07/2024   FERRITIN 95 04/07/2024   IRONPCTSAT 18 04/07/2024   No results found for: LDH  STUDIES:   No results found.     HISTORY:   Past Medical History:  Diagnosis Date   Anorexia nervosa (HCC)    patient denies   Anxiety    Arthritis    Cataract    Chronic depression    Chronic headaches    Colitis    Constipation due to opioid therapy    Diverticulosis    Emphysema/COPD    patient unaware   Fibromyalgia    GERD (gastroesophageal reflux disease)    History of blood in urine    Hyperthyroidism    borderline per patient   MS (multiple sclerosis)    Osteoarthritis    Osteoporosis    Pituitary microadenoma (HCC)    PONV (postoperative nausea and vomiting)    Seizure (HCC)    Sleep disorder    Tremor    Tubular adenoma of colon     Past Surgical History:  Procedure Laterality Date   ABDOMINAL HYSTERECTOMY  2008   APPENDECTOMY     CESAREAN SECTION     x 2   CHOLECYSTECTOMY     COLONOSCOPY     DENTAL SURGERY     LAPAROSCOPIC BILATERAL SALPINGO OOPHERECTOMY Bilateral 11/30/2014   Procedure: LAPAROSCOPIC BILATERAL SALPINGO OOPHORECTOMY;  Surgeon: Charlie Croak, MD;  Location: [facility];  Service: Gynecology;  Laterality: Bilateral;  converted to open   LAPAROSCOPY     LAPAROTOMY N/A 11/30/2014   Procedure: POSSIBLE EXPLORATORY LAPAROTOMY, ;  Surgeon: Charlie Croak, MD;  Location: [facility];  Service: Gynecology;  Laterality: N/A;   NECK SURGERY     OOPHORECTOMY     SALPINGOOPHORECTOMY Bilateral 11/30/2014   Procedure: BILATERAL SALPINGO OOPHORECTOMY;  Surgeon: Charlie Croak, MD;  Location: [facility];  Service:  Gynecology;  Laterality: Bilateral;   TONSILLECTOMY     tumor  breast bone removal benign      Family History  Problem Relation Age of Onset   Colon polyps Mother    Hypertension Mother    Depression Father    Diabetes Father    Colon polyps Father    COPD Father    Diabetes Maternal Grandmother    Colon cancer Paternal Grandmother    Rectal cancer Paternal Grandmother    Pancreatitis Son    Irritable bowel syndrome Other        cousin   Esophageal cancer Neg Hx    Stomach cancer Neg Hx    Pancreatic cancer Neg Hx     Social History:  reports that she has been smoking cigarettes. She has a 0.1 pack-year smoking history. She has never used smokeless tobacco. She reports current alcohol use. She reports that she does not use drugs.The patient is alone  today.  Allergies:  Allergies  Allergen Reactions   Aubagio [Teriflunomide] Other (See Comments)    Abdominal cramping, headaches, blurry vision and mouth sores   Wheat Shortness Of Breath   Carbidopa Other (See Comments)    Nausea and Vomiting    Prednisone Other (See Comments)    Make pt feel crazy   Benadryl  [Diphenhydramine ] Itching    Leg cramps/spasms    Buprenorphine Hcl-Naloxone  Hcl Other (See Comments)    Other reaction(s): hallucinations   Paroxetine Other (See Comments)    Paxil  Other Reaction(s): other   Paroxetine Hcl Other (See Comments)    Crying, depressed   Paroxetine Hcl Other (See Comments)    Other reaction(s): other  Paxil   Polyethylene Glycol (Macrogol) Rash and Itching   Pregabalin Itching   Suboxone [Buprenorphine Hcl-Naloxone  Hcl] Other (See Comments)    Hallucinations            Current Medications: Current Outpatient Medications  Medication Sig Dispense Refill   clonazePAM  (KLONOPIN ) 0.5 MG tablet Take 1 mg by mouth daily.     estrogen-methylTESTOSTERone 0.625-1.25 MG tablet Take 1 tablet by mouth every other day.     FLUoxetine (PROZAC) 20 MG capsule Take 20 mg by mouth  daily. (Patient taking differently: Take 40 mg by mouth daily.)     HYDROcodone -acetaminophen  (NORCO/VICODIN) 5-325 MG tablet Take 1 tablet by mouth daily.     hydrOXYzine (ATARAX/VISTARIL) 25 MG tablet Take 25 mg by mouth 3 (three) times daily as needed.     linaclotide  (LINZESS ) 290 MCG CAPS capsule Take 1 capsule (290 mcg total) by mouth daily before breakfast. 30 capsule 5   meclizine  (ANTIVERT ) 25 MG tablet Take 1 tablet (25 mg total) by mouth 3 (three) times daily as needed for dizziness. 30 tablet 0   methylphenidate  (RITALIN ) 20 MG tablet Take 20 mg by mouth 3 (three) times daily.  0   metoCLOPramide  (REGLAN ) 5 MG tablet Take 1 tablet (5 mg total) by mouth 3 (three) times daily before meals. As needed 90 tablet 3   ondansetron  (ZOFRAN ) 8 MG tablet Take 8 mg by mouth 3 (three) times daily.     promethazine  (PHENERGAN ) 12.5 MG tablet Take 1 tablet (12.5 mg total) by mouth every 6 (six) hours as needed for nausea or vomiting. 40 tablet 0   spironolactone (ALDACTONE) 25 MG tablet Take 25 mg by mouth daily.     ZEPOSIA 0.92 MG CAPS Take 1 capsule by mouth daily.     No current facility-administered medications for this visit.     ASSESSMENT & PLAN:   Assessment:  Sierra Morris is a 64 y.o. female with history of decreased lymphocytes. She denies any history of concerns with labwork in the past. CBC today reveals slightly decreased absolute lymphocytes at 0.6. She denies any symptoms related to infection. We discussed potential causes including infections, autoimmune disorders, medications, nutritional deficiencies, stress and certain cancers. As her results are only slightly decreased, and remaining CBC results are completely normal, I am not concerned that this is an urgent medical concern. All pending studies were normal. We will continue to observe.   Plan: 1.  Return to clinic in 6 months for repeat evaluation.   I discussed the assessment and treatment plan with the patient.  The  patient was provided an opportunity to ask questions and all were answered.  The patient agreed with the plan and demonstrated an understanding of the instructions.    Thank you for the referral    20 minutes was spent in patient care.  This included time spent preparing to see the patient (e.g., review of tests), obtaining and/or reviewing separately obtained history, counseling and educating the patient/family/caregiver, ordering medications, tests, or procedures; documenting clinical information in the electronic or other health record, independently interpreting results and communicating results to the patient/family/caregiver as well as coordination of care.      Sierra DELENA Bach, NP   Family Nurse Practitioner - Board Certified [facility] [phone]     "

## 2024-07-26 ENCOUNTER — Ambulatory Visit (INDEPENDENT_AMBULATORY_CARE_PROVIDER_SITE_OTHER)

## 2024-08-13 ENCOUNTER — Ambulatory Visit (INDEPENDENT_AMBULATORY_CARE_PROVIDER_SITE_OTHER)

## 2024-08-27 ENCOUNTER — Ambulatory Visit (INDEPENDENT_AMBULATORY_CARE_PROVIDER_SITE_OTHER)

## 2025-01-06 ENCOUNTER — Inpatient Hospital Stay: Admitting: Hematology and Oncology

## 2025-01-06 ENCOUNTER — Inpatient Hospital Stay
